# Patient Record
Sex: Male | Born: 1995
Health system: Southern US, Community
[De-identification: ages and names within clinical notes are randomized; demographics above are authoritative.]

## PROBLEM LIST (undated history)

## (undated) DIAGNOSIS — A539 Syphilis, unspecified: Secondary | ICD-10-CM

## (undated) DIAGNOSIS — K5792 Diverticulitis of intestine, part unspecified, without perforation or abscess without bleeding: Secondary | ICD-10-CM

## (undated) HISTORY — PX: OTHER SURGICAL HISTORY: SHX169

## (undated) HISTORY — DX: Syphilis, unspecified: A53.9

---
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
## 2014-02-06 ENCOUNTER — Encounter (HOSPITAL_COMMUNITY): Payer: Self-pay | Admitting: Cardiology

## 2014-02-06 ENCOUNTER — Emergency Department (HOSPITAL_COMMUNITY)
Admission: EM | Admit: 2014-02-06 | Discharge: 2014-02-06 | Disposition: A | Discharge status: Home / Self Care | Payer: Medicaid Other | Attending: Emergency Medicine | Admitting: Emergency Medicine

## 2014-02-06 DIAGNOSIS — Z79899 Other long term (current) drug therapy: Secondary | ICD-10-CM | POA: Diagnosis not present

## 2014-02-06 DIAGNOSIS — Y9289 Other specified places as the place of occurrence of the external cause: Secondary | ICD-10-CM | POA: Diagnosis not present

## 2014-02-06 DIAGNOSIS — Y998 Other external cause status: Secondary | ICD-10-CM | POA: Diagnosis not present

## 2014-02-06 DIAGNOSIS — Y9389 Activity, other specified: Secondary | ICD-10-CM | POA: Insufficient documentation

## 2014-02-06 DIAGNOSIS — Z791 Long term (current) use of non-steroidal anti-inflammatories (NSAID): Secondary | ICD-10-CM | POA: Diagnosis not present

## 2014-02-06 DIAGNOSIS — S01511A Laceration without foreign body of lip, initial encounter: Secondary | ICD-10-CM | POA: Insufficient documentation

## 2014-02-06 MED ORDER — LIDOCAINE-EPINEPHRINE (PF) 2 %-1:200000 IJ SOLN
10.0000 mL | Freq: Once | INTRAMUSCULAR | Status: AC
  Administered 2014-02-06: 10 mL
  Filled 2014-02-06: qty 20

## 2014-02-06 MED ORDER — AMOXICILLIN 500 MG PO CAPS: 500.0000 mg | ORAL_CAPSULE | Freq: Three times a day (TID) | ORAL | Status: AC
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
## 2014-02-06 NOTE — Discharge Instructions (Signed)
Facial Laceration °A facial laceration is a cut on the face. These injuries can be painful and cause bleeding. Some cuts may need to be closed with stitches (sutures), skin adhesive strips, or wound glue. Cuts usually heal quickly but can leave a scar. It can take 1-2 years for the scar to go away completely. °HOME CARE  °· Only take medicines as told by your doctor. °· Follow your doctor's instructions for wound care. °For Stitches: °· Keep the cut clean and dry. °· If you have a bandage (dressing), change it at least once a day. Change the bandage if it gets wet or dirty, or as told by your doctor. °· Wash the cut with soap and water 2 times a day. Rinse the cut with water. Pat it dry with a clean towel. °· Put a thin layer of medicated cream on the cut as told by your doctor. °· You may shower after the first 24 hours. Do not soak the cut in water until the stitches are removed. °· Have your stitches removed as told by your doctor. °· Do not wear any makeup until a few days after your stitches are removed. °For Skin Adhesive Strips: °· Keep the cut clean and dry. °· Do not get the strips wet. You may take a bath, but be careful to keep the cut dry. °· If the cut gets wet, pat it dry with a clean towel. °· The strips will fall off on their own. Do not remove the strips that are still stuck to the cut. °For Wound Glue: °· You may shower or take baths. Do not soak or scrub the cut. Do not swim. Avoid heavy sweating until the glue falls off on its own. After a shower or bath, pat the cut dry with a clean towel. °· Do not put medicine or makeup on your cut until the glue falls off. °· If you have a bandage, do not put tape over the glue. °· Avoid lots of sunlight or tanning lamps until the glue falls off. °· The glue will fall off on its own in 5-10 days. Do not pick at the glue. °After Healing: °Put sunscreen on the cut for the first year to reduce your scar. °GET HELP RIGHT AWAY IF:  °· Your cut area gets red,  painful, or puffy (swollen). °· You see a yellowish-white fluid (pus) coming from the cut. °· You have chills or a fever. °MAKE SURE YOU:  °· Understand these instructions. °· Will watch your condition. °· Will get help right away if you are not doing well or get worse. °Document Released: 06/16/2007 Document Revised: 10/18/2012 Document Reviewed: 08/10/2012 °ExitCare® Patient Information ©2015 [employer]. This information is not intended to replace advice given to you by your health care provider. Make sure you discuss any questions you have with your health care provider. ° °
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
## 2014-04-05 ENCOUNTER — Encounter (HOSPITAL_COMMUNITY): Payer: Self-pay | Admitting: Emergency Medicine

## 2014-04-05 ENCOUNTER — Emergency Department (HOSPITAL_COMMUNITY)
Admission: EM | Admit: 2014-04-05 | Discharge: 2014-04-05 | Disposition: A | Discharge status: Home / Self Care | Payer: Medicaid Other | Attending: Emergency Medicine | Admitting: Emergency Medicine

## 2014-04-05 DIAGNOSIS — W1839XA Other fall on same level, initial encounter: Secondary | ICD-10-CM | POA: Diagnosis not present

## 2014-04-05 DIAGNOSIS — Y998 Other external cause status: Secondary | ICD-10-CM | POA: Diagnosis not present

## 2014-04-05 DIAGNOSIS — Y9389 Activity, other specified: Secondary | ICD-10-CM | POA: Insufficient documentation

## 2014-04-05 DIAGNOSIS — S39012A Strain of muscle, fascia and tendon of lower back, initial encounter: Secondary | ICD-10-CM | POA: Insufficient documentation

## 2014-04-05 DIAGNOSIS — Y9289 Other specified places as the place of occurrence of the external cause: Secondary | ICD-10-CM | POA: Insufficient documentation

## 2014-04-05 DIAGNOSIS — S3992XA Unspecified injury of lower back, initial encounter: Secondary | ICD-10-CM | POA: Diagnosis present

## 2014-04-05 MED ORDER — HYDROCODONE-ACETAMINOPHEN 5-325 MG PO TABS: 1.0000 | ORAL_TABLET | ORAL | Status: DC | PRN

## 2014-04-05 MED ORDER — DIAZEPAM 5 MG PO TABS
5.0000 mg | ORAL_TABLET | Freq: Once | ORAL | Status: AC
  Administered 2014-04-05: 5 mg via ORAL
  Filled 2014-04-05: qty 1

## 2014-04-05 MED ORDER — ONDANSETRON HCL 4 MG PO TABS
4.0000 mg | ORAL_TABLET | Freq: Once | ORAL | Status: AC
  Administered 2014-04-05: 4 mg via ORAL
  Filled 2014-04-05: qty 1

## 2014-04-05 MED ORDER — IBUPROFEN 800 MG PO TABS
800.0000 mg | ORAL_TABLET | Freq: Once | ORAL | Status: AC
  Administered 2014-04-05: 800 mg via ORAL
  Filled 2014-04-05: qty 1

## 2014-04-05 MED ORDER — MELOXICAM 15 MG PO TABS: 15.0000 mg | ORAL_TABLET | Freq: Every day | ORAL | Status: DC

## 2014-04-05 MED ORDER — METHOCARBAMOL 500 MG PO TABS: 500.0000 mg | ORAL_TABLET | Freq: Three times a day (TID) | ORAL | Status: DC

## 2014-04-05 MED ORDER — HYDROCODONE-ACETAMINOPHEN 5-325 MG PO TABS
1.0000 | ORAL_TABLET | Freq: Once | ORAL | Status: AC
  Administered 2014-04-05: 1 via ORAL
  Filled 2014-04-05: qty 1

## 2014-04-05 NOTE — ED Provider Notes (Signed)
CSN: [id number]     Arrival date & time 04/05/14  1478 History   First MD Initiated Contact with Patient 04/05/14 0800     Chief Complaint  Patient presents with  . Back Pain     (Consider location/radiation/quality/duration/timing/severity/associated sxs/prior Treatment) Patient is a 19 y.o. male presenting with back pain. The history is provided by the patient.  Back Pain Location:  Lumbar spine Quality:  Aching and shooting Radiates to:  Does not radiate Pain severity:  Severe Pain is:  Same all the time Onset quality:  Sudden Duration:  1 day Timing:  Intermittent Progression:  Worsening Chronicity:  New Context: falling   Context comment:  Pt had a near fall on yesterday. Increasing pain and spasm last night. Relieved by:  Nothing Worsened by:  Movement and bending Ineffective treatments:  None tried Associated symptoms: no abdominal pain, no bladder incontinence, no bowel incontinence, no chest pain, no dysuria and no perianal numbness   Risk factors: not obese and no recent surgery     History reviewed. No pertinent past medical history. History reviewed. No pertinent past surgical history. No family history on file. History  Substance Use Topics  . Smoking status: Never Smoker   . Smokeless tobacco: Never Used  . Alcohol Use: No    Review of Systems  Constitutional: Negative for activity change.       All ROS Neg except as noted in HPI  HENT: Negative for nosebleeds.   Eyes: Negative for photophobia and discharge.  Respiratory: Negative for cough, shortness of breath and wheezing.   Cardiovascular: Negative for chest pain and palpitations.  Gastrointestinal: Negative for abdominal pain, blood in stool and bowel incontinence.  Genitourinary: Negative for bladder incontinence, dysuria, frequency and hematuria.  Musculoskeletal: Positive for back pain. Negative for arthralgias and neck pain.  Skin: Negative.   Neurological: Negative for dizziness, seizures and  speech difficulty.  Psychiatric/Behavioral: Negative for hallucinations and confusion.      Allergies  Review of patient's allergies indicates no known allergies.  Home Medications   Prior to Admission medications   Medication Sig Start Date End Date Taking? Authorizing Provider  cyclobenzaprine (FLEXERIL) 5 MG tablet Take 1 tablet (5 mg total) by mouth 3 (three) times daily as needed for muscle spasms. Patient not taking: Reported on 02/06/2014 09/20/13   Burgess Amor, PA-C  ibuprofen (ADVIL,MOTRIN) 600 MG tablet Take 1 tablet (600 mg total) by mouth 3 (three) times daily. Patient not taking: Reported on 02/06/2014 09/20/13   Burgess Amor, PA-C   BP 123/77 mmHg  Pulse 54  Temp(Src) 97.6 F (36.4 C) (Oral)  Resp 18  Ht  (1.778 m)  Wt 158 lb (71.668 kg)  BMI 22.67 kg/m2  SpO2 100% Physical Exam  Constitutional: He is oriented to person, place, and time. He appears well-developed and well-nourished.  Non-toxic appearance.  HENT:  Head: Normocephalic.  Right Ear: Tympanic membrane and external ear normal.  Left Ear: Tympanic membrane and external ear normal.  Eyes: EOM and lids are normal. Pupils are equal, round, and reactive to light.  Neck: Normal range of motion. Neck supple. Carotid bruit is not present.  Cardiovascular: Normal rate, regular rhythm, normal heart sounds, intact distal pulses and normal pulses.   Pulmonary/Chest: Breath sounds normal. No respiratory distress.  Abdominal: Soft. Bowel sounds are normal. There is no tenderness. There is no guarding.  Musculoskeletal:       Lumbar back: He exhibits decreased range of motion, tenderness, pain and spasm.  Lymphadenopathy:       Head (right side): No submandibular adenopathy present.       Head (left side): No submandibular adenopathy present.    He has no cervical adenopathy.  Neurological: He is alert and oriented to person, place, and time. He has normal strength. No cranial nerve deficit or sensory deficit.   Skin: Skin is warm and dry.  Psychiatric: He has a normal mood and affect. His speech is normal.  Nursing note and vitals reviewed.   ED Course  Procedures (including critical care time) Labs Review Labs Reviewed - No data to display  Imaging Review No results found.   EKG Interpretation None      MDM Vital signs are well within normal limits. No gross neurologic deficits appreciated on examination. No evidence for caudal equina. Gait is intact. Suspect muscle strain/spasm.  Plan at this time so the patient be treated with Mobic, Robaxin, and Norco. Patient is to follow-up with his primary physician for additional workup and management.    Final diagnoses:  None    **I have reviewed nursing notes, vital signs, and all appropriate lab and imaging results for this patient.Ivery Quale*    Brittanyann Wittner, PA-C 04/05/14 [phone]  Mancel BaleElliott Wentz, MD 04/05/14 [phone]

## 2014-04-05 NOTE — ED Notes (Signed)
Patient c/o severe mid/lower back pain that started last night with no known injury. Denies any urinary symptoms. Per patient has not taken any medication for pain.

## 2014-04-05 NOTE — Discharge Instructions (Signed)
Please apply ice to the lower back today and tomorrow. Sunday apply heat, or soak in a tub or warm water. Use mobic and robaxin daily. Use norco for more severe pain if needed. Norco and robaxin may cause drowsiness, use with caution. This medication may cause drowsiness. Please do not drink, drive, or participate in activity that requires concentration while taking this medication. Muscle Strain A muscle strain (pulled muscle) happens when a muscle is stretched beyond normal length. It happens when a sudden, violent force stretches your muscle too far. Usually, a few of the fibers in your muscle are torn. Muscle strain is common in athletes. Recovery usually takes 1-2 weeks. Complete healing takes 5-6 weeks.  HOME CARE   Follow the PRICE method of treatment to help your injury get better. Do this the first 2-3 days after the injury:  Protect. Protect the muscle to keep it from getting injured again.  Rest. Limit your activity and rest the injured body part.  Ice. Put ice in a plastic bag. Place a towel between your skin and the bag. Then, apply the ice and leave it on from 15-20 minutes each hour. After the third day, switch to moist heat packs.  Compression. Use a splint or elastic bandage on the injured area for comfort. Do not put it on too tightly.  Elevate. Keep the injured body part above the level of your heart.  Only take medicine as told by your doctor.  Warm up before doing exercise to prevent future muscle strains. GET HELP IF:   You have more pain or puffiness (swelling) in the injured area.  You feel numbness, tingling, or notice a loss of strength in the injured area. MAKE SURE YOU:   Understand these instructions.  Will watch your condition.  Will get help right away if you are not doing well or get worse. Document Released: 10/07/2007 Document Revised: 10/18/2012 Document Reviewed: 07/27/2012 [facility] Patient Information 2015 [place]Care, [place]. This information is not  intended to replace advice given to you by your health care provider. Make sure you discuss any questions you have with your health care provider.

## 2014-11-04 ENCOUNTER — Encounter (HOSPITAL_COMMUNITY): Payer: Self-pay | Admitting: Emergency Medicine

## 2014-11-04 ENCOUNTER — Emergency Department (HOSPITAL_COMMUNITY)
Admission: EM | Admit: 2014-11-04 | Discharge: 2014-11-04 | Disposition: A | Discharge status: Home / Self Care | Payer: Medicaid Other | Attending: Emergency Medicine | Admitting: Emergency Medicine

## 2014-11-04 DIAGNOSIS — Z791 Long term (current) use of non-steroidal anti-inflammatories (NSAID): Secondary | ICD-10-CM | POA: Diagnosis not present

## 2014-11-04 DIAGNOSIS — Z79899 Other long term (current) drug therapy: Secondary | ICD-10-CM | POA: Insufficient documentation

## 2014-11-04 DIAGNOSIS — M545 Low back pain: Secondary | ICD-10-CM | POA: Insufficient documentation

## 2014-11-04 DIAGNOSIS — R369 Urethral discharge, unspecified: Secondary | ICD-10-CM | POA: Diagnosis not present

## 2014-11-04 DIAGNOSIS — R300 Dysuria: Secondary | ICD-10-CM | POA: Insufficient documentation

## 2014-11-04 LAB — URINALYSIS, ROUTINE W REFLEX MICROSCOPIC
BILIRUBIN URINE: NEGATIVE
Glucose, UA: NEGATIVE mg/dL
HGB URINE DIPSTICK: NEGATIVE
KETONES UR: NEGATIVE mg/dL
NITRITE: NEGATIVE
PH: 5.5 (ref 5.0–8.0)
Protein, ur: NEGATIVE mg/dL
UROBILINOGEN UA: 0.2 mg/dL (ref 0.0–1.0)

## 2014-11-04 LAB — URINE MICROSCOPIC-ADD ON

## 2014-11-04 MED ORDER — CEPHALEXIN 500 MG PO CAPS: 500.0000 mg | ORAL_CAPSULE | Freq: Four times a day (QID) | ORAL | Status: DC

## 2014-11-04 MED ORDER — LIDOCAINE HCL (PF) 1 % IJ SOLN
INTRAMUSCULAR | Status: AC
  Administered 2014-11-04: 0.9 mL
  Filled 2014-11-04: qty 5

## 2014-11-04 MED ORDER — AZITHROMYCIN 250 MG PO TABS
1000.0000 mg | ORAL_TABLET | Freq: Once | ORAL | Status: AC
  Administered 2014-11-04: 1000 mg via ORAL
  Filled 2014-11-04: qty 4

## 2014-11-04 MED ORDER — CEFTRIAXONE SODIUM 250 MG IJ SOLR
250.0000 mg | Freq: Once | INTRAMUSCULAR | Status: AC
  Administered 2014-11-04: 250 mg via INTRAMUSCULAR
  Filled 2014-11-04: qty 250

## 2014-11-04 NOTE — Discharge Instructions (Signed)

## 2014-11-04 NOTE — ED Provider Notes (Signed)
CSN: [id number]     Arrival date & time 11/04/14  1040 History  By signing my name below, I, Thomas Valdez, attest that this documentation has been prepared under the direction and in the presence of Pauline Aus, PA-C  Electronically Signed: Gwenyth Valdez, ED Scribe. 11/04/2014. 11:46 AM.   Chief Complaint  Patient presents with  . Urinary Tract Infection   Patient is a 19 y.o. male presenting with dysuria. The history is provided by the patient. No language interpreter was used.  Dysuria This is a new problem. The current episode started more than 1 week ago. The problem occurs constantly. The problem has not changed since onset.Pertinent negatives include no abdominal pain. Nothing aggravates the symptoms. Nothing relieves the symptoms. He has tried nothing for the symptoms.    HPI Comments: Thomas Valdez is a 19 y.o. male who presents to the Emergency Department complaining of  mild dysuria that started 2 weeks, resolved and returned yesterday. He states penile discharge and lower back pain as associated symptoms. Pt endorses unprotected sex with one partner. He denies a history of UTI. Pt also denies abdominal pain, dark urine, malodorous urine, fever and testicular pain as associated symptoms.  History reviewed. No pertinent past medical history. History reviewed. No pertinent past surgical history. History reviewed. No pertinent family history. Social History  Substance Use Topics  . Smoking status: Never Smoker   . Smokeless tobacco: Never Used  . Alcohol Use: No    Review of Systems  Constitutional: Negative for fever.  Gastrointestinal: Negative for nausea, vomiting and abdominal pain.  Genitourinary: Positive for dysuria and discharge. Negative for hematuria, scrotal swelling, genital sores and testicular pain.  Musculoskeletal: Positive for back pain.  All other systems reviewed and are negative.  Allergies  Review of patient's allergies indicates no known  allergies.  Home Medications   Prior to Admission medications   Medication Sig Start Date End Date Taking? Authorizing Provider  cyclobenzaprine (FLEXERIL) 5 MG tablet Take 1 tablet (5 mg total) by mouth 3 (three) times daily as needed for muscle spasms. Patient not taking: Reported on 02/06/2014 09/20/13   Burgess Amor, PA-C  HYDROcodone-acetaminophen (NORCO/VICODIN) 5-325 MG per tablet Take 1 tablet by mouth every 4 (four) hours as needed. 04/05/14   Ivery Quale, PA-C  ibuprofen (ADVIL,MOTRIN) 600 MG tablet Take 1 tablet (600 mg total) by mouth 3 (three) times daily. Patient not taking: Reported on 02/06/2014 09/20/13   Burgess Amor, PA-C  meloxicam (MOBIC) 15 MG tablet Take 1 tablet (15 mg total) by mouth daily. 04/05/14   Ivery Quale, PA-C  methocarbamol (ROBAXIN) 500 MG tablet Take 1 tablet (500 mg total) by mouth 3 (three) times daily. 04/05/14   Ivery Quale, PA-C   BP 127/75 mmHg  Pulse 64  Temp(Src) 97.7 F (36.5 C) (Oral)  Resp 18  Ht  (1.778 m)  Wt 165 lb (74.844 kg)  BMI 23.68 kg/m2  SpO2 100% Physical Exam  Constitutional: He appears well-developed and well-nourished. No distress.  HENT:  Head: Normocephalic and atraumatic.  Eyes: Conjunctivae and EOM are normal.  Neck: Neck supple. No tracheal deviation present.  Cardiovascular: Normal rate, regular rhythm and normal heart sounds.   Pulmonary/Chest: Effort normal and breath sounds normal. No respiratory distress.  Abdominal: Soft. There is no tenderness.  Genitourinary:  Chaperoned by nurse No penile discharge on exam Cremasteric reflex intact No genital lesions No obvious inguinal hernias  Skin: Skin is warm and dry.  Psychiatric: He has a  normal mood and affect. His behavior is normal.  Nursing note and vitals reviewed.   ED Course  Procedures   DIAGNOSTIC STUDIES: Oxygen Saturation is 100% on RA, normal by my interpretation.    COORDINATION OF CARE: 11:46 AM Discussed treatment plan with pt which  includes UA, HIV antibody and RPR. Pt agreed to plan.  Labs Review Labs Reviewed  URINALYSIS, ROUTINE W REFLEX MICROSCOPIC ([facility]) - Abnormal; Notable for the following:    Specific Gravity, Urine >1.030 (*)    Leukocytes, UA TRACE (*)    All other components within normal limits  RPR - Abnormal; Notable for the following:    RPR Ser Ql Reactive (*)    All other components within normal limits  URINE MICROSCOPIC-ADD ON - Abnormal; Notable for the following:    Bacteria, UA MANY (*)    All other components within normal limits  RPR, QUANT+TP ABS (REFLEX) - Abnormal; Notable for the following:    Rapid Plasma Reagin, Quant 1:8 (*)    T Pallidum Abs Positive (*)    All other components within normal limits  GC/CHLAMYDIA PROBE AMP ([place]) NOT AT [facility] - Abnormal; Notable for the following:    Neisseria gonorrhea **POSITIVE** (*)    All other components within normal limits  URINE CULTURE  HIV ANTIBODY (ROUTINE TESTING)      MDM     Final diagnoses:  Dysuria    Pt is well appearing.  Vitals stable. Pt on cell phone during my exam.  Dysuria sx's in a sexually active male.  I will tx with rocephin and zithromax.  Cultures pending.  Will also tx with keflex.  Pt agrees to plan and health dept f/u if needed.  Stable for d/c  I personally performed the services described in this documentation, which was scribed in my presence. The recorded information has been reviewed and is accurate.    Pauline Ausammy Yvonnie Schinke, PA-C 11/06/14 1634  Blane OharaJoshua Zavitz, MD 11/08/14 [phone]

## 2014-11-04 NOTE — ED Notes (Signed)
Having burning when voiding for last week on and off.  Burning has increased yesterday.

## 2014-11-05 LAB — RPR, QUANT+TP ABS (REFLEX): TREPONEMA PALLIDUM AB: POSITIVE — AB

## 2014-11-05 LAB — GC/CHLAMYDIA PROBE AMP (~~LOC~~) NOT AT ARMC
Chlamydia: NEGATIVE
Neisseria Gonorrhea: POSITIVE — AB

## 2014-11-05 LAB — RPR: RPR Ser Ql: REACTIVE — AB

## 2014-11-05 LAB — HIV ANTIBODY (ROUTINE TESTING W REFLEX): HIV SCREEN 4TH GENERATION: NONREACTIVE

## 2014-11-06 ENCOUNTER — Telehealth (HOSPITAL_BASED_OUTPATIENT_CLINIC_OR_DEPARTMENT_OTHER): Payer: Self-pay | Admitting: Emergency Medicine

## 2014-11-06 LAB — URINE CULTURE: CULTURE: NO GROWTH

## 2014-11-06 NOTE — Telephone Encounter (Signed)
Chart handoff to EDP for treatment plan for + Gonorrhea and + RPR

## 2014-11-09 ENCOUNTER — Telehealth (HOSPITAL_BASED_OUTPATIENT_CLINIC_OR_DEPARTMENT_OTHER): Payer: Self-pay | Admitting: Emergency Medicine

## 2014-11-10 ENCOUNTER — Telehealth (HOSPITAL_BASED_OUTPATIENT_CLINIC_OR_DEPARTMENT_OTHER): Payer: Self-pay | Admitting: Emergency Medicine

## 2014-11-12 ENCOUNTER — Telehealth (HOSPITAL_BASED_OUTPATIENT_CLINIC_OR_DEPARTMENT_OTHER): Payer: Self-pay | Admitting: Emergency Medicine

## 2014-11-14 ENCOUNTER — Telehealth (HOSPITAL_BASED_OUTPATIENT_CLINIC_OR_DEPARTMENT_OTHER): Payer: Self-pay | Admitting: Emergency Medicine

## 2014-12-04 ENCOUNTER — Telehealth (HOSPITAL_COMMUNITY): Payer: Self-pay

## 2014-12-04 NOTE — Telephone Encounter (Signed)
Unable to reach by phone or mail.  Chart closed.   

## 2015-03-02 ENCOUNTER — Emergency Department (HOSPITAL_COMMUNITY)
Admission: EM | Admit: 2015-03-02 | Discharge: 2015-03-03 | Disposition: A | Discharge status: Home / Self Care | Payer: Medicaid Other | Attending: Emergency Medicine | Admitting: Emergency Medicine

## 2015-03-02 ENCOUNTER — Encounter (HOSPITAL_COMMUNITY): Payer: Self-pay | Admitting: Emergency Medicine

## 2015-03-02 ENCOUNTER — Emergency Department (HOSPITAL_COMMUNITY)
Admission: EM | Admit: 2015-03-02 | Discharge: 2015-03-02 | Disposition: A | Discharge status: Home / Self Care | Payer: Medicaid Other | Source: Home / Self Care

## 2015-03-02 ENCOUNTER — Encounter (HOSPITAL_COMMUNITY): Payer: Self-pay | Admitting: *Deleted

## 2015-03-02 DIAGNOSIS — R112 Nausea with vomiting, unspecified: Secondary | ICD-10-CM

## 2015-03-02 DIAGNOSIS — R197 Diarrhea, unspecified: Secondary | ICD-10-CM

## 2015-03-02 DIAGNOSIS — R509 Fever, unspecified: Secondary | ICD-10-CM

## 2015-03-02 DIAGNOSIS — R109 Unspecified abdominal pain: Secondary | ICD-10-CM | POA: Diagnosis present

## 2015-03-02 LAB — CBC WITH DIFFERENTIAL/PLATELET
Basophils Absolute: 0 10*3/uL (ref 0.0–0.1)
Basophils Relative: 0 %
EOS PCT: 2 %
Eosinophils Absolute: 0.1 10*3/uL (ref 0.0–0.7)
HCT: 42.4 % (ref 39.0–52.0)
HEMOGLOBIN: 14.4 g/dL (ref 13.0–17.0)
LYMPHS ABS: 1.7 10*3/uL (ref 0.7–4.0)
LYMPHS PCT: 48 %
MCH: 30.7 pg (ref 26.0–34.0)
MCHC: 34 g/dL (ref 30.0–36.0)
MCV: 90.4 fL (ref 78.0–100.0)
Monocytes Absolute: 0.4 10*3/uL (ref 0.1–1.0)
Monocytes Relative: 10 %
Neutro Abs: 1.4 10*3/uL — ABNORMAL LOW (ref 1.7–7.7)
Neutrophils Relative %: 40 %
PLATELETS: 299 10*3/uL (ref 150–400)
RBC: 4.69 MIL/uL (ref 4.22–5.81)
RDW: 12.2 % (ref 11.5–15.5)
WBC: 3.6 10*3/uL — AB (ref 4.0–10.5)

## 2015-03-02 LAB — COMPREHENSIVE METABOLIC PANEL
ALK PHOS: 57 U/L (ref 38–126)
ALT: 20 U/L (ref 17–63)
AST: 21 U/L (ref 15–41)
Albumin: 4.7 g/dL (ref 3.5–5.0)
Anion gap: 6 (ref 5–15)
BUN: 15 mg/dL (ref 6–20)
CALCIUM: 9.3 mg/dL (ref 8.9–10.3)
CO2: 27 mmol/L (ref 22–32)
CREATININE: 0.97 mg/dL (ref 0.61–1.24)
Chloride: 105 mmol/L (ref 101–111)
Glucose, Bld: 103 mg/dL — ABNORMAL HIGH (ref 65–99)
Potassium: 4.3 mmol/L (ref 3.5–5.1)
Sodium: 138 mmol/L (ref 135–145)
Total Bilirubin: 0.6 mg/dL (ref 0.3–1.2)
Total Protein: 7.8 g/dL (ref 6.5–8.1)

## 2015-03-02 MED ORDER — ONDANSETRON 8 MG PO TBDP
8.0000 mg | ORAL_TABLET | Freq: Once | ORAL | Status: AC
  Administered 2015-03-02: 8 mg via ORAL
  Filled 2015-03-02: qty 1

## 2015-03-02 MED ORDER — ONDANSETRON HCL 4 MG PO TABS: 4.0000 mg | ORAL_TABLET | Freq: Four times a day (QID) | ORAL | Status: DC

## 2015-03-02 NOTE — ED Notes (Signed)
Called x2, no answer 

## 2015-03-02 NOTE — Discharge Instructions (Signed)
Food Choices to Help Relieve Diarrhea, Adult °When you have diarrhea, the foods you eat and your eating habits are very important. Choosing the right foods and drinks can help relieve diarrhea. Also, because diarrhea can last up to 7 days, you need to replace lost fluids and electrolytes (such as sodium, potassium, and chloride) in order to help prevent dehydration.  °WHAT GENERAL GUIDELINES DO I NEED TO FOLLOW? °· Slowly drink 1 cup (8 oz) of fluid for each episode of diarrhea. If you are getting enough fluid, your urine will be clear or pale yellow. °· Eat starchy foods. Some good choices include white rice, white toast, pasta, low-fiber cereal, baked potatoes (without the skin), saltine crackers, and bagels. °· Avoid large servings of any cooked vegetables. °· Limit fruit to two servings per day. A serving is ½ cup or 1 small piece. °· Choose foods with less than 2 g of fiber per serving. °· Limit fats to less than 8 tsp (38 g) per day. °· Avoid fried foods. °· Eat foods that have probiotics in them. Probiotics can be found in certain dairy products. °· Avoid foods and beverages that may increase the speed at which food moves through the stomach and intestines (gastrointestinal tract). Things to avoid include: °¨ High-fiber foods, such as dried fruit, raw fruits and vegetables, nuts, seeds, and whole grain foods. °¨ Spicy foods and high-fat foods. °¨ Foods and beverages sweetened with high-fructose corn syrup, honey, or sugar alcohols such as xylitol, sorbitol, and mannitol. °WHAT FOODS ARE RECOMMENDED? °Grains °White rice. White, French, or pita breads (fresh or toasted), including plain rolls, buns, or bagels. White pasta. Saltine, soda, or graham crackers. Pretzels. Low-fiber cereal. Cooked cereals made with water (such as cornmeal, farina, or cream cereals). Plain muffins. Matzo. Melba toast. Zwieback.  °Vegetables °Potatoes (without the skin). Strained tomato and vegetable juices. Most well-cooked and canned  vegetables without seeds. Tender lettuce. °Fruits °Cooked or canned applesauce, apricots, cherries, fruit cocktail, grapefruit, peaches, pears, or plums. Fresh bananas, apples without skin, cherries, grapes, cantaloupe, grapefruit, peaches, oranges, or plums.  °Meat and Other Protein Products °Baked or boiled chicken. Eggs. Tofu. Fish. Seafood. Smooth peanut butter. Ground or well-cooked tender beef, ham, veal, lamb, pork, or poultry.  °Dairy °Plain yogurt, kefir, and unsweetened liquid yogurt. Lactose-free milk, buttermilk, or soy milk. Plain hard cheese. °Beverages °Sport drinks. Clear broths. Diluted fruit juices (except prune). Regular, caffeine-free sodas such as ginger ale. Water. Decaffeinated teas. Oral rehydration solutions. Sugar-free beverages not sweetened with sugar alcohols. °Other °Bouillon, broth, or soups made from recommended foods.  °The items listed above may not be a complete list of recommended foods or beverages. Contact your dietitian for more options. °WHAT FOODS ARE NOT RECOMMENDED? °Grains °Whole grain, whole wheat, bran, or rye breads, rolls, pastas, crackers, and cereals. Wild or brown rice. Cereals that contain more than 2 g of fiber per serving. Corn tortillas or taco shells. Cooked or dry oatmeal. Granola. Popcorn. °Vegetables °Raw vegetables. Cabbage, broccoli, Brussels sprouts, artichokes, baked beans, beet greens, corn, kale, legumes, peas, sweet potatoes, and yams. Potato skins. Cooked spinach and cabbage. °Fruits °Dried fruit, including raisins and dates. Raw fruits. Stewed or dried prunes. Fresh apples with skin, apricots, mangoes, pears, raspberries, and strawberries.  °Meat and Other Protein Products °Chunky peanut butter. Nuts and seeds. Beans and lentils. Bacon.  °Dairy °High-fat cheeses. Milk, chocolate milk, and beverages made with milk, such as milk shakes. Cream. Ice cream. °Sweets and Desserts °Sweet rolls, doughnuts, and sweet breads.   Pancakes and waffles. °Fats and  Oils °Butter. Cream sauces. Margarine. Salad oils. Plain salad dressings. Olives. Avocados.  °Beverages °Caffeinated beverages (such as coffee, tea, soda, or energy drinks). Alcoholic beverages. Fruit juices with pulp. Prune juice. Soft drinks sweetened with high-fructose corn syrup or sugar alcohols. °Other °Coconut. Hot sauce. Chili powder. Mayonnaise. Gravy. Cream-based or milk-based soups.  °The items listed above may not be a complete list of foods and beverages to avoid. Contact your dietitian for more information. °WHAT SHOULD I DO IF I BECOME DEHYDRATED? °Diarrhea can sometimes lead to dehydration. Signs of dehydration include dark urine and dry mouth and skin. If you think you are dehydrated, you should rehydrate with an oral rehydration solution. These solutions can be purchased at pharmacies, retail stores, or online.  °Drink ½-1 cup (120-240 mL) of oral rehydration solution each time you have an episode of diarrhea. If drinking this amount makes your diarrhea worse, try drinking smaller amounts more often. For example, drink 1-3 tsp (5-15 mL) every 5-10 minutes.  °A general rule for staying hydrated is to drink 1½-2 L of fluid per day. Talk to your health care provider about the specific amount you should be drinking each day. Drink enough fluids to keep your urine clear or pale yellow. °  °This information is not intended to replace advice given to you by your health care provider. Make sure you discuss any questions you have with your health care provider. °  °Document Released: 03/20/2003 Document Revised: 01/18/2014 Document Reviewed: 11/20/2012 °Elsevier Interactive Patient Education ©2016 [employer] ° °Nausea and Vomiting °Nausea means you feel sick to your stomach. Throwing up (vomiting) is a reflex where stomach contents come out of your mouth. °HOME CARE  °· Take medicine as told by your doctor. °· Do not force yourself to eat. However, you do need to drink fluids. °· If you feel like eating,  eat a normal diet as told by your doctor. °¨ Eat rice, wheat, potatoes, bread, lean meats, yogurt, fruits, and vegetables. °¨ Avoid high-fat foods. °· Drink enough fluids to keep your pee (urine) clear or pale yellow. °· Ask your doctor how to replace body fluid losses (rehydrate). Signs of body fluid loss (dehydration) include: °¨ Feeling very thirsty. °¨ Dry lips and mouth. °¨ Feeling dizzy. °¨ Dark pee. °¨ Peeing less than normal. °¨ Feeling confused. °¨ Fast breathing or heart rate. °GET HELP RIGHT AWAY IF:  °· You have blood in your throw up. °· You have black or bloody poop (stool). °· You have a bad headache or stiff neck. °· You feel confused. °· You have bad belly (abdominal) pain. °· You have chest pain or trouble breathing. °· You do not pee at least once every 8 hours. °· You have cold, clammy skin. °· You keep throwing up after 24 to 48 hours. °· You have a fever. °MAKE SURE YOU:  °· Understand these instructions. °· Will watch your condition. °· Will get help right away if you are not doing well or get worse. °  °This information is not intended to replace advice given to you by your health care provider. Make sure you discuss any questions you have with your health care provider. °  °Document Released: 06/16/2007 Document Revised: 03/22/2011 Document Reviewed: 05/29/2010 °Elsevier Interactive Patient Education ©2016 [employer] ° °

## 2015-03-02 NOTE — ED Notes (Signed)
Pt c/o n/v/d that started earlier this am;

## 2015-03-02 NOTE — ED Provider Notes (Signed)
CSN: [id number]     Arrival date & time 03/02/15  2158 History   First MD Initiated Contact with Patient 03/02/15 2224     Chief Complaint  Patient presents with  . Abdominal Pain     (Consider location/radiation/quality/duration/timing/severity/associated sxs/prior Treatment) HPI  Thomas Valdez is a 20 y.o. male who presents to the Emergency Department complaining of sudden onset of vomiting, diarrhea and cramping abdominal pain that began early this morning.  He states that he had 4 episodes of vomiting and several episodes of watery stools before symptoms resolved at 9:00 am this morning.  He has not tried to eat or drink anything since this morning. He states his symptoms have improved, but came to  "be checked"  He denies fever, abdominal pain, dysuria, chills, bloody stools or vomitus.      History reviewed. No pertinent past medical history. History reviewed. No pertinent past surgical history. History reviewed. No pertinent family history. Social History  Substance Use Topics  . Smoking status: Never Smoker   . Smokeless tobacco: Never Used  . Alcohol Use: No    Review of Systems  Constitutional: Negative for fever, chills, activity change and appetite change.  HENT: Negative for congestion and sore throat.   Respiratory: Negative for shortness of breath.   Cardiovascular: Negative for chest pain.  Gastrointestinal: Positive for nausea, vomiting and diarrhea. Negative for abdominal pain and blood in stool.  Endocrine: Negative for polydipsia and polyuria.  Genitourinary: Negative for dysuria, flank pain, decreased urine volume and difficulty urinating.  Musculoskeletal: Negative for back pain.  Skin: Negative for color change and rash.  Neurological: Negative for dizziness, weakness and numbness.  Hematological: Negative for adenopathy.  All other systems reviewed and are negative.     Allergies  Review of patient's allergies indicates no known  allergies.  Home Medications   Prior to Admission medications   Medication Sig Start Date End Date Taking? Authorizing Provider  bismuth subsalicylate (PEPTO BISMOL) 262 MG/15ML suspension Take 30 mLs by mouth every 6 (six) hours as needed for indigestion or diarrhea or loose stools.   Yes Historical Provider, MD  cephALEXin (KEFLEX) 500 MG capsule Take 1 capsule (500 mg total) by mouth 4 (four) times daily. For 7 days Patient not taking: Reported on 03/02/2015 11/04/14   Aniyah Nobis, PA-C  HYDROcodone-acetaminophen (NORCO/VICODIN) 5-325 MG per tablet Take 1 tablet by mouth every 4 (four) hours as needed. Patient not taking: Reported on 03/02/2015 04/05/14   Ivery Quale, PA-C   BP 128/82 mmHg  Pulse 86  Temp(Src) 98.9 F (37.2 C) (Oral)  Resp 18  Ht  (1.778 m)  Wt 74.844 kg  BMI 23.68 kg/m2  SpO2 100% Physical Exam  Constitutional: He is oriented to person, place, and time. He appears well-developed and well-nourished. No distress.  HENT:  Head: Normocephalic and atraumatic.  Mouth/Throat: Oropharynx is clear and moist.  Neck: Normal range of motion.  Cardiovascular: Normal rate, regular rhythm, normal heart sounds and intact distal pulses.   No murmur heard. Pulmonary/Chest: Effort normal and breath sounds normal. No respiratory distress.  Abdominal: Soft. Normal appearance and bowel sounds are normal. He exhibits no distension and no mass. There is no hepatosplenomegaly. There is no tenderness. There is no rigidity, no rebound, no guarding and no tenderness at McBurney's point.  Musculoskeletal: Normal range of motion. He exhibits no edema.  Lymphadenopathy:    He has no cervical adenopathy.  Neurological: He is alert and oriented to person, place,  and time. He exhibits normal muscle tone. Coordination normal.  Skin: Skin is warm and dry.  Psychiatric: He has a normal mood and affect.  Nursing note and vitals reviewed.   ED Course  Procedures (including critical  care time) Labs Review Labs Reviewed - No data to display  Imaging Review No results found. I have personally reviewed and evaluated these images and lab results as part of my medical decision-making.   EKG Interpretation None      MDM   Final diagnoses:  Nausea vomiting and diarrhea    2345  Pt is feeling better, has gotten dressed and states he feels ready for d/c.  He has tolerated oral fluids and abd remains soft and NT.  Sx's likely related to viral illness.  No concerning sx's for acute abdomen.    Pt also seen by Dr. Adriana Simas and care plan discussed.  BRAT diet and clear fluids encouraged.  Pt agrees to return if needed.      Pauline Aus, PA-C 03/03/15 0007  Donnetta Hutching, MD 03/05/15 [phone]

## 2015-03-02 NOTE — ED Notes (Addendum)
Patient c/o nausea, vomiting, diarrhea, and fever. Per patient started this morning and has not drank or ate since. Denies any blood in stool. Denies any pain.

## 2015-03-02 NOTE — ED Notes (Signed)
Patient called for room, no answer. 

## 2015-09-18 ENCOUNTER — Emergency Department (HOSPITAL_COMMUNITY)
Admission: EM | Admit: 2015-09-18 | Discharge: 2015-09-18 | Disposition: A | Discharge status: Home / Self Care | Payer: No Typology Code available for payment source | Attending: Emergency Medicine | Admitting: Emergency Medicine

## 2015-09-18 ENCOUNTER — Encounter (HOSPITAL_COMMUNITY): Payer: Self-pay | Admitting: Emergency Medicine

## 2015-09-18 ENCOUNTER — Emergency Department (HOSPITAL_COMMUNITY): Payer: No Typology Code available for payment source

## 2015-09-18 DIAGNOSIS — Y9389 Activity, other specified: Secondary | ICD-10-CM | POA: Diagnosis not present

## 2015-09-18 DIAGNOSIS — Y92410 Unspecified street and highway as the place of occurrence of the external cause: Secondary | ICD-10-CM | POA: Diagnosis not present

## 2015-09-18 DIAGNOSIS — Y999 Unspecified external cause status: Secondary | ICD-10-CM | POA: Insufficient documentation

## 2015-09-18 DIAGNOSIS — S39012A Strain of muscle, fascia and tendon of lower back, initial encounter: Secondary | ICD-10-CM | POA: Insufficient documentation

## 2015-09-18 DIAGNOSIS — S3992XA Unspecified injury of lower back, initial encounter: Secondary | ICD-10-CM | POA: Diagnosis present

## 2015-09-18 MED ORDER — IBUPROFEN 600 MG PO TABS: 600.0000 mg | ORAL_TABLET | Freq: Four times a day (QID) | ORAL | 0 refills | Status: DC | PRN

## 2015-09-18 MED ORDER — TRAMADOL HCL 50 MG PO TABS: 50.0000 mg | ORAL_TABLET | Freq: Four times a day (QID) | ORAL | 0 refills | Status: DC | PRN

## 2015-09-18 MED ORDER — IBUPROFEN 800 MG PO TABS
800.0000 mg | ORAL_TABLET | Freq: Once | ORAL | Status: AC
  Administered 2015-09-18: 800 mg via ORAL
  Filled 2015-09-18: qty 1
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
## 2016-12-28 ENCOUNTER — Encounter (HOSPITAL_COMMUNITY): Payer: Self-pay | Admitting: Emergency Medicine

## 2016-12-28 ENCOUNTER — Other Ambulatory Visit: Payer: Self-pay

## 2016-12-28 ENCOUNTER — Emergency Department (HOSPITAL_COMMUNITY)
Admission: EM | Admit: 2016-12-28 | Discharge: 2016-12-28 | Disposition: A | Discharge status: Home / Self Care | Payer: Self-pay | Attending: Emergency Medicine | Admitting: Emergency Medicine

## 2016-12-28 ENCOUNTER — Emergency Department (HOSPITAL_COMMUNITY): Payer: Self-pay

## 2016-12-28 DIAGNOSIS — Y929 Unspecified place or not applicable: Secondary | ICD-10-CM | POA: Insufficient documentation

## 2016-12-28 DIAGNOSIS — W208XXA Other cause of strike by thrown, projected or falling object, initial encounter: Secondary | ICD-10-CM | POA: Insufficient documentation

## 2016-12-28 DIAGNOSIS — Y999 Unspecified external cause status: Secondary | ICD-10-CM | POA: Insufficient documentation

## 2016-12-28 DIAGNOSIS — Y939 Activity, unspecified: Secondary | ICD-10-CM | POA: Insufficient documentation

## 2016-12-28 DIAGNOSIS — S40012A Contusion of left shoulder, initial encounter: Secondary | ICD-10-CM | POA: Insufficient documentation

## 2016-12-28 DIAGNOSIS — T148XXA Other injury of unspecified body region, initial encounter: Secondary | ICD-10-CM

## 2016-12-28 MED ORDER — ACETAMINOPHEN 325 MG PO TABS
650.0000 mg | ORAL_TABLET | Freq: Once | ORAL | Status: AC
  Administered 2016-12-28: 650 mg via ORAL
  Filled 2016-12-28: qty 2

## 2016-12-28 MED ORDER — IBUPROFEN 800 MG PO TABS
800.0000 mg | ORAL_TABLET | Freq: Once | ORAL | Status: AC
  Administered 2016-12-28: 800 mg via ORAL
  Filled 2016-12-28: qty 1
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
## 2016-12-28 NOTE — Discharge Instructions (Signed)
Your vital signs within normal limits.  Your oxygen level is 99% on room air.  The x-ray is negative for fracture or dislocation.  And there are no gross neurological or vascular problems noted on your examination.  Please apply an ice pack.  Please use 600 mg of ibuprofen and 500 mg of Tylenol every 6 hours for soreness.  Please see Dr. Romeo AppleHarrison for orthopedic management if this is not improving.

## 2016-12-28 NOTE — ED Provider Notes (Signed)
[facility] EMERGENCY DEPARTMENT Provider Note   CSN: [id number] Arrival date & time: 12/28/16  1553     History   Chief Complaint Chief Complaint  Patient presents with  . Shoulder Pain    HPI Thomas Mackintoshresean J Buster is a 21 y.o. male.  Patient is a 21 year old male who presents to the emergency department with left shoulder area pain.  Patient states that on last evening he had a box that was dropped on his left clavicle shoulder area.  The patient has been having increasing pain since that time and today he feels that he has pain with every movement of his shoulder.  He is concerned that he has a broken bone.  No previous history of injury.  No recent operations or procedures involving the shoulder area.      History reviewed. No pertinent past medical history.  There are no active problems to display for this patient.   History reviewed. No pertinent surgical history.     Home Medications    Prior to Admission medications   Medication Sig Start Date End Date Taking? Authorizing Provider  ibuprofen (ADVIL,MOTRIN) 600 MG tablet Take 1 tablet (600 mg total) by mouth every 6 (six) hours as needed. 09/18/15   Burgess AmorIdol, Julie, PA-C  traMADol (ULTRAM) 50 MG tablet Take 1 tablet (50 mg total) by mouth every 6 (six) hours as needed. 09/18/15   Burgess AmorIdol, Julie, PA-C    Family History History reviewed. No pertinent family history.  Social History Social History   Tobacco Use  . Smoking status: Never Smoker  . Smokeless tobacco: Never Used  Substance Use Topics  . Alcohol use: No  . Drug use: No     Allergies   Patient has no known allergies.   Review of Systems Review of Systems  Constitutional: Negative for activity change.       All ROS Neg except as noted in HPI  HENT: Negative for nosebleeds.   Eyes: Negative for photophobia and discharge.  Respiratory: Negative for cough, shortness of breath and wheezing.   Cardiovascular: Negative for chest pain and  palpitations.  Gastrointestinal: Negative for abdominal pain and blood in stool.  Genitourinary: Negative for dysuria, frequency and hematuria.  Musculoskeletal: Positive for arthralgias. Negative for back pain and neck pain.       Shoulder/clavicle pain  Skin: Negative.   Neurological: Negative for dizziness, seizures and speech difficulty.  Psychiatric/Behavioral: Negative for confusion and hallucinations.     Physical Exam Updated Vital Signs BP 127/81 (BP Location: Right Arm)   Pulse 64   Temp 98.7 F (37.1 C) (Oral)   Resp 18   Ht 5\' 11"  (1.803 m)   Wt 74.8 kg (165 lb)   SpO2 99%   BMI 23.01 kg/m   Physical Exam  Constitutional: He is oriented to person, place, and time. He appears well-developed and well-nourished.  Non-toxic appearance.  HENT:  Head: Normocephalic.  Right Ear: Tympanic membrane and external ear normal.  Left Ear: Tympanic membrane and external ear normal.  Eyes: EOM and lids are normal. Pupils are equal, round, and reactive to light.  Neck: Normal range of motion. Neck supple. Carotid bruit is not present.  Cardiovascular: Normal rate, regular rhythm, normal heart sounds, intact distal pulses and normal pulses.  Pulmonary/Chest: Breath sounds normal. No respiratory distress.  Abdominal: Soft. Bowel sounds are normal. There is no tenderness. There is no guarding.  Musculoskeletal: Normal range of motion.       Left shoulder: He  exhibits tenderness and pain. He exhibits no crepitus, no deformity and normal strength.       Arms: There is full range of motion of the fingers of the left upper extremity.  Capillary refill is less than 2 seconds.  Radial pulses 2+.  There is full range of motion of the left wrist and elbow.  There is good range of motion of the left shoulder, but with pain.  There is no palpable deformity of the clavicle or the shoulder area.  There is no deformity of the scapula.  Lymphadenopathy:       Head (right side): No submandibular  adenopathy present.       Head (left side): No submandibular adenopathy present.    He has no cervical adenopathy.  Neurological: He is alert and oriented to person, place, and time. He has normal strength. No cranial nerve deficit or sensory deficit.  Grip is symmetrical.  There is no asymmetry of motor strength of the upper extremities.  There are no sensory deficits appreciated.  Skin: Skin is warm and dry.  Psychiatric: He has a normal mood and affect. His speech is normal.  Nursing note and vitals reviewed.    ED Treatments / Results  Labs (all labs ordered are listed, but only abnormal results are displayed) Labs Reviewed - No data to display  EKG  EKG Interpretation None       Radiology No results found.  Procedures Procedures (including critical care time)  Medications Ordered in ED Medications - No data to display   Initial Impression / Assessment and Plan / ED Course  I have reviewed the triage vital signs and the nursing notes.  Pertinent labs & imaging results that were available during my care of the patient were reviewed by me and considered in my medical decision making (see chart for details).       Final Clinical Impressions(s) / ED Diagnoses MDM Vital signs reviewed.  Pulse oximetry is 99% on room air.  Within normal limits by my interpretation.  The patient has symmetrical rise and fall of the chest, and speaks in complete sentences.  There is soreness with range of motion of the shoulder, but no palpable deformity.  No vascular changes noted.  X-ray of the clavicle shoulder area is negative for acute problem.  Of asked the patient to use ibuprofen and or Tylenol every 6 hours for soreness.  Patient will be given an ice pack to use.  He will be given the name of orthopedic on call if  to evaluate if this is not improving.   Final diagnoses:  Contusion of left clavicle, initial encounter    ED Discharge Orders    None       Ivery QualeBryant, Masin Shatto,  PA-C 12/28/16 1654    Ivery QualeBryant, Armoni Depass, PA-C 01/07/17 0047    Donnetta Hutchingook, Brian, MD 01/08/17 1251

## 2017-02-01 DIAGNOSIS — E663 Overweight: Secondary | ICD-10-CM | POA: Diagnosis not present

## 2017-02-01 DIAGNOSIS — Z6827 Body mass index (BMI) 27.0-27.9, adult: Secondary | ICD-10-CM | POA: Diagnosis not present

## 2017-02-01 DIAGNOSIS — Z0000 Encounter for general adult medical examination without abnormal findings: Secondary | ICD-10-CM | POA: Diagnosis not present

## 2017-02-01 DIAGNOSIS — Z1389 Encounter for screening for other disorder: Secondary | ICD-10-CM | POA: Diagnosis not present

## 2017-02-03 ENCOUNTER — Other Ambulatory Visit: Payer: Self-pay

## 2017-02-03 ENCOUNTER — Encounter (HOSPITAL_COMMUNITY): Payer: Self-pay | Admitting: Emergency Medicine

## 2017-02-03 ENCOUNTER — Emergency Department (HOSPITAL_COMMUNITY)
Admission: EM | Admit: 2017-02-03 | Discharge: 2017-02-03 | Disposition: A | Discharge status: Home / Self Care | Payer: Self-pay | Attending: Emergency Medicine | Admitting: Emergency Medicine

## 2017-02-03 DIAGNOSIS — W268XXA Contact with other sharp object(s), not elsewhere classified, initial encounter: Secondary | ICD-10-CM | POA: Insufficient documentation

## 2017-02-03 DIAGNOSIS — Y929 Unspecified place or not applicable: Secondary | ICD-10-CM | POA: Insufficient documentation

## 2017-02-03 DIAGNOSIS — S61212A Laceration without foreign body of right middle finger without damage to nail, initial encounter: Secondary | ICD-10-CM | POA: Insufficient documentation

## 2017-02-03 DIAGNOSIS — Y999 Unspecified external cause status: Secondary | ICD-10-CM | POA: Insufficient documentation

## 2017-02-03 DIAGNOSIS — Y9389 Activity, other specified: Secondary | ICD-10-CM | POA: Insufficient documentation

## 2017-02-03 MED ORDER — TRAMADOL HCL 50 MG PO TABS: 50.0000 mg | ORAL_TABLET | Freq: Four times a day (QID) | ORAL | 0 refills | Status: DC | PRN

## 2017-02-03 MED ORDER — POVIDONE-IODINE 10 % EX SOLN
CUTANEOUS | Status: AC
  Filled 2017-02-03: qty 15

## 2017-02-03 MED ORDER — LIDOCAINE HCL (PF) 2 % IJ SOLN: 2.0000 mL | Freq: Once | INTRAMUSCULAR | Status: DC

## 2017-02-03 MED ORDER — LIDOCAINE HCL (PF) 2 % IJ SOLN
INTRAMUSCULAR | Status: AC
  Filled 2017-02-03: qty 10

## 2017-02-03 NOTE — Discharge Instructions (Signed)
Have your sutures removed in 10 days.  Keep your wound clean and dry,  Until a good scab forms - you may then wash gently twice daily with mild soap and water, but dry completely after.  Get rechecked for any sign of infection (redness,  Swelling,  Increased pain or drainage of purulent fluid). ° °

## 2017-02-03 NOTE — ED Triage Notes (Signed)
Pt has laceration to the end of right middle finger from onion slicer.

## 2017-02-04 NOTE — ED Provider Notes (Signed)
[facility] EMERGENCY DEPARTMENT Provider Note   CSN: [id number] Arrival date & time: 02/03/17  1754     History   Chief Complaint Chief Complaint  Patient presents with  . Laceration    HPI Thomas Valdez is a 23 y.o. male presenting with a laceration to his right distal long finger which occurred just prior to arrival.  He picked up his mother's onion slicer which contains multiple very sharp blades and accidentally sliced his distal long finger with the object.  He has been unable to control the bleeding and reports throbbing pain at the site since the event.  He is current on his tetanus vaccine.  He denies numbness distal to the injury site.  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  HPI  History reviewed. No pertinent past medical history.  There are no active problems to display for this patient.   History reviewed. No pertinent surgical history.     Home Medications    Prior to Admission medications   Medication Sig Start Date End Date Taking? Authorizing Provider  ibuprofen (ADVIL,MOTRIN) 600 MG tablet Take 1 tablet (600 mg total) by mouth every 6 (six) hours as needed. 09/18/15   Burgess Amor, PA-C  traMADol (ULTRAM) 50 MG tablet Take 1 tablet (50 mg total) by mouth every 6 (six) hours as needed. 02/03/17   Burgess Amor, PA-C    Family History History reviewed. No pertinent family history.  Social History Social History   Tobacco Use  . Smoking status: Never Smoker  . Smokeless tobacco: Never Used  Substance Use Topics  . Alcohol use: No  . Drug use: No     Allergies   Patient has no known allergies.   Review of Systems Review of Systems  Constitutional: Negative for chills and fever.  Musculoskeletal: Negative for arthralgias.  Skin: Positive for wound.  Neurological: Negative for numbness.     Physical Exam Updated Vital Signs BP 128/85   Pulse 61   Temp 98.6 F (37 C)   Resp 17   Ht 5\' 11"  (1.803 m)   Wt 83.9 kg (185 lb)    SpO2 99%   BMI 25.80 kg/m   Physical Exam  Constitutional: He is oriented to person, place, and time. He appears well-developed and well-nourished.  HENT:  Head: Normocephalic.  Cardiovascular: Normal rate.  Pulmonary/Chest: Effort normal.  Musculoskeletal: He exhibits no tenderness.  Neurological: He is alert and oriented to person, place, and time. No sensory deficit.  Skin: Laceration noted.  2 cm linear laceration volar distal right long finger.  The wound is well approximated and currently hemostatic.  The wound appears clean.     ED Treatments / Results  Labs (all labs ordered are listed, but only abnormal results are displayed) Labs Reviewed - No data to display  EKG  EKG Interpretation None       Radiology No results found.  Procedures Procedures (including critical care time)  LACERATION REPAIR Performed by: Burgess Amor Authorized by: Burgess Amor Consent: Verbal consent obtained. Risks and benefits: risks, benefits and alternatives were discussed Consent given by: patient Patient identity confirmed: provided demographic data Prepped and Draped in normal sterile fashion Wound explored  Laceration Location: Right long finger  Laceration Length: 2 cm  No Foreign Bodies seen or palpated  Anesthesia: Digital block Local anesthetic: lidocaine 2 % without epinephrine  Anesthetic total: 1.5 ml  Irrigation method: syringe Amount of cleaning: standard  Skin closure: Ethilon 4-0  Number of  sutures: 5  Technique: Simple interrupted  Patient tolerance: Patient tolerated the procedure well with no immediate complications.   Medications Ordered in ED Medications - No data to display   Initial Impression / Assessment and Plan / ED Course  I have reviewed the triage vital signs and the nursing notes.  Pertinent labs & imaging results that were available during my care of the patient were reviewed by me and considered in my medical decision making  (see chart for details).     Wound care instructions given.  Pt advised to have sutures removed in 10 days,  Return here sooner for any signs of infection including redness, swelling, worse pain or drainage of pus.  Dressing was provided.  We also gave him an aluminum finger splint molded over the tip of the finger to protect his wound while at work.     Final Clinical Impressions(s) / ED Diagnoses   Final diagnoses:  Laceration of right middle finger without foreign body without damage to nail, initial encounter    ED Discharge Orders        Ordered    traMADol (ULTRAM) 50 MG tablet  Every 6 hours PRN     02/03/17 2225       Burgess Amordol, Clodagh Odenthal, PA-C 02/04/17 0150    Burgess AmorIdol, Nalea Salce, PA-C 02/04/17 0150    Long, Arlyss RepressJoshua G, MD 02/04/17 [phone]

## 2017-02-07 DIAGNOSIS — R739 Hyperglycemia, unspecified: Secondary | ICD-10-CM | POA: Diagnosis not present

## 2017-02-14 DIAGNOSIS — Z6826 Body mass index (BMI) 26.0-26.9, adult: Secondary | ICD-10-CM | POA: Diagnosis not present

## 2017-02-14 DIAGNOSIS — E663 Overweight: Secondary | ICD-10-CM | POA: Diagnosis not present

## 2017-02-14 DIAGNOSIS — Z4802 Encounter for removal of sutures: Secondary | ICD-10-CM | POA: Diagnosis not present

## 2017-02-16 DIAGNOSIS — Z681 Body mass index (BMI) 19 or less, adult: Secondary | ICD-10-CM | POA: Diagnosis not present

## 2017-02-16 DIAGNOSIS — A601 Herpesviral infection of perianal skin and rectum: Secondary | ICD-10-CM | POA: Diagnosis not present

## 2017-08-31 DIAGNOSIS — Z1389 Encounter for screening for other disorder: Secondary | ICD-10-CM | POA: Diagnosis not present

## 2017-08-31 DIAGNOSIS — E663 Overweight: Secondary | ICD-10-CM | POA: Diagnosis not present

## 2017-08-31 DIAGNOSIS — Z6828 Body mass index (BMI) 28.0-28.9, adult: Secondary | ICD-10-CM | POA: Diagnosis not present

## 2017-08-31 DIAGNOSIS — R5383 Other fatigue: Secondary | ICD-10-CM | POA: Diagnosis not present

## 2017-12-21 DIAGNOSIS — E663 Overweight: Secondary | ICD-10-CM | POA: Diagnosis not present

## 2017-12-21 DIAGNOSIS — Z6827 Body mass index (BMI) 27.0-27.9, adult: Secondary | ICD-10-CM | POA: Diagnosis not present

## 2017-12-21 DIAGNOSIS — Z1389 Encounter for screening for other disorder: Secondary | ICD-10-CM | POA: Diagnosis not present

## 2017-12-21 DIAGNOSIS — M25511 Pain in right shoulder: Secondary | ICD-10-CM | POA: Diagnosis not present

## 2018-08-17 ENCOUNTER — Encounter (HOSPITAL_COMMUNITY): Payer: Self-pay | Admitting: Emergency Medicine

## 2018-08-17 ENCOUNTER — Emergency Department (HOSPITAL_COMMUNITY): Payer: Self-pay

## 2018-08-17 ENCOUNTER — Other Ambulatory Visit: Payer: Self-pay

## 2018-08-17 ENCOUNTER — Emergency Department (HOSPITAL_COMMUNITY)
Admission: EM | Admit: 2018-08-17 | Discharge: 2018-08-17 | Disposition: A | Discharge status: Home / Self Care | Payer: Self-pay | Attending: Emergency Medicine | Admitting: Emergency Medicine

## 2018-08-17 DIAGNOSIS — N200 Calculus of kidney: Secondary | ICD-10-CM | POA: Insufficient documentation

## 2018-08-17 LAB — CBC WITH DIFFERENTIAL/PLATELET
Abs Immature Granulocytes: 0.01 10*3/uL (ref 0.00–0.07)
Basophils Absolute: 0 10*3/uL (ref 0.0–0.1)
Basophils Relative: 0 %
Eosinophils Absolute: 0 10*3/uL (ref 0.0–0.5)
Eosinophils Relative: 0 %
HCT: 46 % (ref 39.0–52.0)
Hemoglobin: 14.9 g/dL (ref 13.0–17.0)
Immature Granulocytes: 0 %
Lymphocytes Relative: 21 %
Lymphs Abs: 1.6 10*3/uL (ref 0.7–4.0)
MCH: 30 pg (ref 26.0–34.0)
MCHC: 32.4 g/dL (ref 30.0–36.0)
MCV: 92.7 fL (ref 80.0–100.0)
Monocytes Absolute: 0.5 10*3/uL (ref 0.1–1.0)
Monocytes Relative: 6 %
Neutro Abs: 5.7 10*3/uL (ref 1.7–7.7)
Neutrophils Relative %: 73 %
Platelets: 343 10*3/uL (ref 150–400)
RBC: 4.96 MIL/uL (ref 4.22–5.81)
RDW: 12.4 % (ref 11.5–15.5)
WBC: 7.9 10*3/uL (ref 4.0–10.5)
nRBC: 0 % (ref 0.0–0.2)

## 2018-08-17 LAB — COMPREHENSIVE METABOLIC PANEL WITH GFR
ALT: 29 U/L (ref 0–44)
AST: 24 U/L (ref 15–41)
Albumin: 4.8 g/dL (ref 3.5–5.0)
Alkaline Phosphatase: 72 U/L (ref 38–126)
Anion gap: 11 (ref 5–15)
BUN: 19 mg/dL (ref 6–20)
CO2: 25 mmol/L (ref 22–32)
Calcium: 9.8 mg/dL (ref 8.9–10.3)
Chloride: 103 mmol/L (ref 98–111)
Creatinine, Ser: 1.06 mg/dL (ref 0.61–1.24)
GFR calc Af Amer: >60 mL/min (ref >60)
GFR calc non Af Amer: >60 mL/min (ref >60)
Glucose, Bld: 100 mg/dL — ABNORMAL HIGH (ref 70–99)
Potassium: 4.1 mmol/L (ref 3.5–5.1)
Sodium: 139 mmol/L (ref 135–145)
Total Bilirubin: 0.7 mg/dL (ref 0.3–1.2)
Total Protein: 8.4 g/dL — ABNORMAL HIGH (ref 6.5–8.1)

## 2018-08-17 LAB — URINALYSIS, ROUTINE W REFLEX MICROSCOPIC
Glucose, UA: NEGATIVE mg/dL
Ketones, ur: NEGATIVE mg/dL
Leukocytes,Ua: NEGATIVE
Nitrite: NEGATIVE
Protein, ur: 30 mg/dL — AB
Specific Gravity, Urine: >1.03 — ABNORMAL HIGH (ref 1.005–1.030)
pH: 6 (ref 5.0–8.0)

## 2018-08-17 LAB — URINALYSIS, MICROSCOPIC (REFLEX): RBC / HPF: >50 RBC/hpf (ref 0–5)

## 2018-08-17 MED ORDER — OXYCODONE-ACETAMINOPHEN 5-325 MG PO TABS: 1.0000 | ORAL_TABLET | Freq: Four times a day (QID) | ORAL | 0 refills | Status: DC | PRN

## 2018-08-17 MED ORDER — CEPHALEXIN 500 MG PO CAPS: 500.0000 mg | ORAL_CAPSULE | Freq: Four times a day (QID) | ORAL | 0 refills | Status: DC

## 2018-08-17 MED ORDER — TAMSULOSIN HCL 0.4 MG PO CAPS: 0.4000 mg | ORAL_CAPSULE | Freq: Every day | ORAL | 0 refills | Status: DC

## 2018-08-17 MED ORDER — ONDANSETRON 4 MG PO TBDP: ORAL_TABLET | ORAL | 0 refills | Status: DC

## 2018-08-17 MED ORDER — CEPHALEXIN 500 MG PO CAPS
500.0000 mg | ORAL_CAPSULE | Freq: Once | ORAL | Status: AC
  Administered 2018-08-17: 500 mg via ORAL
  Filled 2018-08-17: qty 1

## 2018-08-17 MED ORDER — HYDROMORPHONE HCL 1 MG/ML IJ SOLN
0.5000 mg | Freq: Once | INTRAMUSCULAR | Status: AC
  Administered 2018-08-17: 23:00:00 0.5 mg via INTRAVENOUS
  Filled 2018-08-17: qty 1

## 2018-08-17 MED ORDER — TAMSULOSIN HCL 0.4 MG PO CAPS
0.4000 mg | ORAL_CAPSULE | Freq: Once | ORAL | Status: AC
  Administered 2018-08-17: 0.4 mg via ORAL
  Filled 2018-08-17: qty 1

## 2018-08-17 MED ORDER — ONDANSETRON HCL 4 MG/2ML IJ SOLN
4.0000 mg | Freq: Once | INTRAMUSCULAR | Status: AC
  Administered 2018-08-17: 4 mg via INTRAVENOUS
  Filled 2018-08-17: qty 2

## 2018-08-17 MED ORDER — KETOROLAC TROMETHAMINE 30 MG/ML IJ SOLN
30.0000 mg | Freq: Once | INTRAMUSCULAR | Status: AC
  Administered 2018-08-17: 30 mg via INTRAVENOUS
  Filled 2018-08-17: qty 1

## 2018-08-17 MED ORDER — HYDROMORPHONE HCL 1 MG/ML IJ SOLN
0.5000 mg | Freq: Once | INTRAMUSCULAR | Status: AC
  Administered 2018-08-17: 0.5 mg via INTRAVENOUS
  Filled 2018-08-17: qty 1

## 2018-08-17 NOTE — ED Notes (Signed)
Patient informed a urine sample is needed at this time. 

## 2018-08-17 NOTE — Discharge Instructions (Addendum)
Call tomorrow to make an appointment to follow-up with [facility] next week

## 2018-08-17 NOTE — ED Provider Notes (Signed)
[facility] EMERGENCY DEPARTMENT Provider Note   CSN: [id number] Arrival date & time: 08/17/18  1753     History   Chief Complaint Chief Complaint  Patient presents with  . Abdominal Pain    HPI Thomas Valdez is a 23 y.o. male.     Patient complains of right flank pain and groin pain that started today.  The pain is severe.  The history is provided by the patient. No language interpreter was used.  Abdominal Pain Pain location:  R flank Pain quality: aching   Pain radiates to:  Does not radiate Pain severity:  Severe Onset quality:  Sudden Timing:  Constant Progression:  Worsening Chronicity:  New Context: not alcohol use   Relieved by:  Nothing Worsened by:  Nothing Associated symptoms: no chest pain, no cough, no diarrhea, no fatigue and no hematuria     History reviewed. No pertinent past medical history.  There are no active problems to display for this patient.   History reviewed. No pertinent surgical history.      Home Medications    Prior to Admission medications   Medication Sig Start Date End Date Taking? Authorizing Provider  cephALEXin (KEFLEX) 500 MG capsule Take 1 capsule (500 mg total) by mouth 4 (four) times daily. 08/17/18   Milton Ferguson, MD  ondansetron (ZOFRAN ODT) 4 MG disintegrating tablet 4mg  ODT q4 hours prn nausea/vomit 08/17/18   Milton Ferguson, MD  oxyCODONE-acetaminophen (PERCOCET) 5-325 MG tablet Take 1 tablet by mouth every 6 (six) hours as needed. 08/17/18   Milton Ferguson, MD  oxyCODONE-acetaminophen (PERCOCET/ROXICET) 5-325 MG tablet Take 1 tablet by mouth every 6 (six) hours as needed. 08/17/18   Milton Ferguson, MD  tamsulosin (FLOMAX) 0.4 MG CAPS capsule Take 1 capsule (0.4 mg total) by mouth daily. 08/17/18   Milton Ferguson, MD    Family History No family history on file.  Social History Social History   Tobacco Use  . Smoking status: Never Smoker  . Smokeless tobacco: Never Used  Substance Use Topics  . Alcohol  use: No  . Drug use: No     Allergies   Patient has no known allergies.   Review of Systems Review of Systems  Constitutional: Negative for appetite change and fatigue.  HENT: Negative for congestion, ear discharge and sinus pressure.   Eyes: Negative for discharge.  Respiratory: Negative for cough.   Cardiovascular: Negative for chest pain.  Gastrointestinal: Positive for abdominal pain. Negative for diarrhea.  Genitourinary: Negative for frequency and hematuria.       Right flank pain  Musculoskeletal: Negative for back pain.  Skin: Negative for rash.  Neurological: Negative for seizures and headaches.  Psychiatric/Behavioral: Negative for hallucinations.     Physical Exam Updated Vital Signs BP 125/85   Pulse (!) 57   Temp 98.7 F (37.1 C) (Oral)   Resp 18   Ht 5\' 10"  (1.778 m)   Wt 86.2 kg   SpO2 96%   BMI 27.26 kg/m   Physical Exam Vitals signs and nursing note reviewed.  Constitutional:      Appearance: He is well-developed.  HENT:     Head: Normocephalic.     Nose: Nose normal.  Eyes:     General: No scleral icterus.    Conjunctiva/sclera: Conjunctivae normal.  Neck:     Musculoskeletal: Neck supple.     Thyroid: No thyromegaly.  Cardiovascular:     Rate and Rhythm: Normal rate and regular rhythm.     Heart  sounds: No murmur. No friction rub. No gallop.   Pulmonary:     Breath sounds: No stridor. No wheezing or rales.  Chest:     Chest wall: No tenderness.  Abdominal:     General: There is no distension.     Tenderness: There is no abdominal tenderness. There is no rebound.  Genitourinary:    Comments: Tender right flank Musculoskeletal: Normal range of motion.  Lymphadenopathy:     Cervical: No cervical adenopathy.  Skin:    Findings: No erythema or rash.  Neurological:     Mental Status: He is oriented to person, place, and time.     Motor: No abnormal muscle tone.     Coordination: Coordination normal.  Psychiatric:        Behavior:  Behavior normal.      ED Treatments / Results  Labs (all labs ordered are listed, but only abnormal results are displayed) Labs Reviewed  COMPREHENSIVE METABOLIC PANEL - Abnormal; Notable for the following components:      Result Value   Glucose, Bld 100 (*)    Total Protein 8.4 (*)    All other components within normal limits  URINALYSIS, ROUTINE W REFLEX MICROSCOPIC - Abnormal; Notable for the following components:   Color, Urine BROWN (*)    APPearance TURBID (*)    Specific Gravity, Urine >1.030 (*)    Hgb urine dipstick LARGE (*)    Bilirubin Urine SMALL (*)    Protein, ur 30 (*)    All other components within normal limits  URINALYSIS, MICROSCOPIC (REFLEX) - Abnormal; Notable for the following components:   Bacteria, UA MANY (*)    All other components within normal limits  URINE CULTURE  CBC WITH DIFFERENTIAL/PLATELET    EKG None  Radiology Ct Renal Stone Study  Result Date: 08/17/2018 CLINICAL DATA:  Right-sided flank pain EXAM: CT ABDOMEN AND PELVIS WITHOUT CONTRAST TECHNIQUE: Multidetector CT imaging of the abdomen and pelvis was performed following the standard protocol without IV contrast. COMPARISON:  None. FINDINGS: Lower chest: No acute abnormality. Hepatobiliary: No focal liver abnormality is seen. No gallstones, gallbladder wall thickening, or biliary dilatation. Pancreas: Unremarkable. No pancreatic ductal dilatation or surrounding inflammatory changes. Spleen: Normal in size without focal abnormality. Adrenals/Urinary Tract: Adrenal glands are within normal limits. Scattered nonobstructing renal calculi are noted on the left. The right kidney demonstrates mild hydronephrosis and hydroureter which extends to the level of the pelvic inlet where a 3 mm stone is identified best seen on image number 67 of series 2. The more distal right ureter is within normal limits. The bladder is within limits. Stomach/Bowel: Stomach is within normal limits. Appendix appears normal.  No evidence of bowel wall thickening, distention, or inflammatory changes. Vascular/Lymphatic: No significant vascular findings are present. No enlarged abdominal or pelvic lymph nodes. Reproductive: Prostate is unremarkable. Other: No abdominal wall hernia or abnormality. No abdominopelvic ascites. Musculoskeletal: No acute or significant osseous findings. IMPRESSION: 3 mm right mid ureteral stone with obstructive change. Scattered small nonobstructing left renal calculi. Electronically Signed   By: Alcide CleverMark  Lukens M.D.   On: 08/17/2018 21:11    Procedures Procedures (including critical care time)  Medications Ordered in ED Medications  HYDROmorphone (DILAUDID) injection 0.5 mg (has no administration in time range)  tamsulosin (FLOMAX) capsule 0.4 mg (has no administration in time range)  cephALEXin (KEFLEX) capsule 500 mg (has no administration in time range)  ondansetron (ZOFRAN) injection 4 mg (4 mg Intravenous Given 08/17/18 2024)  ketorolac (TORADOL)  30 MG/ML injection 30 mg (30 mg Intravenous Given 08/17/18 2026)  HYDROmorphone (DILAUDID) injection 0.5 mg (0.5 mg Intravenous Given 08/17/18 2027)     Initial Impression / Assessment and Plan / ED Course  I have reviewed the triage vital signs and the nursing notes.  Pertinent labs & imaging results that were available during my care of the patient were reviewed by me and considered in my medical decision making (see chart for details).        Labs unremarkable except for urinalysis shows bacteria.  And urinalysis shows blood.  CT scan shows kidney stone on the right.  Labs reviewed and CT scan reviewed.  Patient will be sent home with Percocet Zofran Flomax Keflex and follow-up with urology  Final Clinical Impressions(s) / ED Diagnoses   Final diagnoses:  Kidney stone    ED Discharge Orders         Ordered    oxyCODONE-acetaminophen (PERCOCET/ROXICET) 5-325 MG tablet  Every 6 hours PRN     08/17/18 2218    oxyCODONE-acetaminophen  (PERCOCET) 5-325 MG tablet  Every 6 hours PRN     08/17/18 2218    cephALEXin (KEFLEX) 500 MG capsule  4 times daily     08/17/18 2218    tamsulosin (FLOMAX) 0.4 MG CAPS capsule  Daily     08/17/18 2218    ondansetron (ZOFRAN ODT) 4 MG disintegrating tablet     08/17/18 2218           Bethann BerkshireZammit, Zykera Abella, MD 08/17/18 2222

## 2018-08-17 NOTE — ED Triage Notes (Signed)
Lower abd pain, lower back pain and RT testicle pain since 0930 today.  Urine is dark brown color.

## 2018-08-18 MED FILL — Oxycodone w/ Acetaminophen Tab 5-325 MG: ORAL | Qty: 6 | Status: AC

## 2018-08-19 LAB — URINE CULTURE: Culture: NO GROWTH

## 2018-09-20 DIAGNOSIS — Z1389 Encounter for screening for other disorder: Secondary | ICD-10-CM | POA: Diagnosis not present

## 2018-09-20 DIAGNOSIS — Z6829 Body mass index (BMI) 29.0-29.9, adult: Secondary | ICD-10-CM | POA: Diagnosis not present

## 2018-09-20 DIAGNOSIS — R2231 Localized swelling, mass and lump, right upper limb: Secondary | ICD-10-CM | POA: Diagnosis not present

## 2018-09-20 DIAGNOSIS — E663 Overweight: Secondary | ICD-10-CM | POA: Diagnosis not present

## 2018-09-28 DIAGNOSIS — R2231 Localized swelling, mass and lump, right upper limb: Secondary | ICD-10-CM | POA: Diagnosis not present

## 2018-10-18 ENCOUNTER — Other Ambulatory Visit: Payer: Self-pay

## 2018-10-18 DIAGNOSIS — M7989 Other specified soft tissue disorders: Secondary | ICD-10-CM | POA: Diagnosis not present

## 2018-10-18 DIAGNOSIS — D2111 Benign neoplasm of connective and other soft tissue of right upper limb, including shoulder: Secondary | ICD-10-CM | POA: Diagnosis not present

## 2018-10-18 DIAGNOSIS — R2231 Localized swelling, mass and lump, right upper limb: Secondary | ICD-10-CM | POA: Diagnosis not present

## 2019-01-09 ENCOUNTER — Ambulatory Visit: Payer: Medicaid Other | Attending: Internal Medicine

## 2019-01-09 ENCOUNTER — Other Ambulatory Visit: Payer: Self-pay

## 2019-01-09 DIAGNOSIS — Z20822 Contact with and (suspected) exposure to covid-19: Secondary | ICD-10-CM

## 2019-01-09 DIAGNOSIS — Z20828 Contact with and (suspected) exposure to other viral communicable diseases: Secondary | ICD-10-CM | POA: Diagnosis not present

## 2019-01-10 LAB — NOVEL CORONAVIRUS, NAA: SARS-CoV-2, NAA: NOT DETECTED

## 2019-08-02 DIAGNOSIS — E663 Overweight: Secondary | ICD-10-CM | POA: Diagnosis not present

## 2019-08-02 DIAGNOSIS — Z6828 Body mass index (BMI) 28.0-28.9, adult: Secondary | ICD-10-CM | POA: Diagnosis not present

## 2019-08-02 DIAGNOSIS — Z0000 Encounter for general adult medical examination without abnormal findings: Secondary | ICD-10-CM | POA: Diagnosis not present

## 2020-06-10 DIAGNOSIS — J029 Acute pharyngitis, unspecified: Secondary | ICD-10-CM | POA: Diagnosis not present

## 2020-07-11 ENCOUNTER — Ambulatory Visit
Admission: EM | Admit: 2020-07-11 | Discharge: 2020-07-11 | Disposition: A | Discharge status: Home / Self Care | Payer: BC Managed Care – PPO | Attending: Emergency Medicine | Admitting: Emergency Medicine

## 2020-07-11 ENCOUNTER — Encounter: Payer: Self-pay | Admitting: Emergency Medicine

## 2020-07-11 DIAGNOSIS — R109 Unspecified abdominal pain: Secondary | ICD-10-CM | POA: Insufficient documentation

## 2020-07-11 DIAGNOSIS — R1031 Right lower quadrant pain: Secondary | ICD-10-CM | POA: Insufficient documentation

## 2020-07-11 LAB — POCT URINALYSIS DIP (MANUAL ENTRY)
Bilirubin, UA: NEGATIVE
Blood, UA: NEGATIVE
Glucose, UA: 100 mg/dL — AB
Ketones, POC UA: NEGATIVE mg/dL
Leukocytes, UA: NEGATIVE
Nitrite, UA: POSITIVE — AB
Protein Ur, POC: NEGATIVE mg/dL
Spec Grav, UA: 1.015 (ref 1.010–1.025)
Urobilinogen, UA: 1 E.U./dL
pH, UA: 8.5 — AB (ref 5.0–8.0)

## 2020-07-11 MED ORDER — KETOROLAC TROMETHAMINE 60 MG/2ML IM SOLN
60.0000 mg | Freq: Once | INTRAMUSCULAR | Status: AC
  Administered 2020-07-11: 60 mg via INTRAMUSCULAR

## 2020-07-11 MED ORDER — ONDANSETRON HCL 4 MG PO TABS: 4.0000 mg | ORAL_TABLET | Freq: Four times a day (QID) | ORAL | 0 refills | Status: DC

## 2020-07-11 MED ORDER — TAMSULOSIN HCL 0.4 MG PO CAPS: 0.4000 mg | ORAL_CAPSULE | Freq: Every day | ORAL | 0 refills | Status: DC

## 2020-07-11 MED ORDER — MELOXICAM 15 MG PO TABS: 15.0000 mg | ORAL_TABLET | Freq: Every day | ORAL | 0 refills | Status: DC

## 2020-07-11 NOTE — ED Provider Notes (Signed)
[facility]   CC: Flank pain  SUBJECTIVE:  Thomas Valdez is a 25 y.o. male who complains of RLQ and RT flank pain with aching in testicles that started last night.  Patient denies a precipitating event.  Admits to history of kidney stones.  Pain is intermittent and describes it as achy.  Has tried OTC AZO without relief.  Symptoms are made worse with urination.  Admits to similar symptoms in the past.  Denies fever, chills, nausea, vomiting.    LMP: No LMP for male patient.  ROS: As in HPI.  All other pertinent ROS negative.     History reviewed. No pertinent past medical history. History reviewed. No pertinent surgical history. No Known Allergies No current facility-administered medications on file prior to encounter.   No current outpatient medications on file prior to encounter.   Social History   Socioeconomic History   Marital status: Single    Spouse name: Not on file   Number of children: Not on file   Years of education: Not on file   Highest education level: Not on file  Occupational History   Not on file  Tobacco Use   Smoking status: Never   Smokeless tobacco: Never  Vaping Use   Vaping Use: Never used  Substance and Sexual Activity   Alcohol use: No   Drug use: No   Sexual activity: Not on file  Other Topics Concern   Not on file  Social History Narrative   Not on file   Social Determinants of Health   Financial Resource Strain: Not on file  Food Insecurity: Not on file  Transportation Needs: Not on file  Physical Activity: Not on file  Stress: Not on file  Social Connections: Not on file  Intimate Partner Violence: Not on file   No family history on file.  OBJECTIVE:  Vitals:   07/11/20 1535  BP: 116/74  Pulse: 64  Resp: 18  Temp: 98.3 F (36.8 C)  TempSrc: Oral  SpO2: 96%   General appearance: AOx3 in no acute distress HEENT: NCAT.  Oropharynx clear.  Lungs: clear to auscultation bilaterally without adventitious  breath sounds Heart: regular rate and rhythm.  Abdomen: soft; non-distended; TTP over RLQ; bowel sounds present; no guarding Extremities: no edema; symmetrical with no gross deformities Skin: warm and dry Neurologic: Ambulates from chair to exam table without difficulty Psychological: alert and cooperative; normal mood and affect  Labs Reviewed  POCT URINALYSIS DIP (MANUAL ENTRY) - Abnormal; Notable for the following components:      Result Value   Color, UA orange (*)    Glucose, UA =100 (*)    pH, UA 8.5 (*)    Nitrite, UA Positive (*)    All other components within normal limits  URINE CULTURE    ASSESSMENT & PLAN:  1. Right flank pain   2. RLQ abdominal pain     Meds ordered this encounter  Medications   tamsulosin (FLOMAX) 0.4 MG CAPS capsule    Sig: Take 1 capsule (0.4 mg total) by mouth daily after breakfast.    Dispense:  30 capsule    Refill:  0    Order Specific Question:   Supervising Provider    Answer:   Eustace Moore [[id number]]   ondansetron (ZOFRAN) 4 MG tablet    Sig: Take 1 tablet (4 mg total) by mouth every 6 (six) hours.    Dispense:  12 tablet    Refill:  0  Order Specific Question:   Supervising Provider    Answer:   Eustace Moore [[id number]]   meloxicam (MOBIC) 15 MG tablet    Sig: Take 1 tablet (15 mg total) by mouth daily.    Dispense:  20 tablet    Refill:  0    Order Specific Question:   Supervising Provider    Answer:   Eustace Moore [[id number]]   ketorolac (TORADOL) injection 60 mg   Toradol shot given in office Drink plenty of fluids and get rest Strain all urine and follow up with PCP with specimen Use flomax as directed  Use OTC mobic as needed for pain.   Follow up with PCP if symptoms persist Return or go to ER if you have any new or worsening symptoms (difficulty urinating, blood in urine, pain that does not moderate with medication, fever, chills, abdominal pain, etc...)   Outlined signs and symptoms indicating need  for more acute intervention. Patient verbalized understanding. After Visit Summary given.      Rennis Harding, PA-C 07/11/20 [phone]

## 2020-07-11 NOTE — Discharge Instructions (Addendum)
Unable to rule out appendicitis in urgent care setting.  Offered patient further evaluation and management in the ED.  Patient declines at this time and would like to try outpatient therapy first.  Aware of the risk associated with this decision including missed diagnosis, organ damage, organ failure, and/or death.  Patient aware and in agreement.     Toradol shot given in office Drink plenty of fluids and get rest Strain all urine and follow up with PCP with specimen Use flomax as directed  Use OTC mobic as needed for pain.   Follow up with PCP if symptoms persist Return or go to ER if you have any new or worsening symptoms (difficulty urinating, blood in urine, pain that does not moderate with medication, fever, chills, abdominal pain, etc...)

## 2020-07-11 NOTE — ED Triage Notes (Signed)
RT flank pain, RLQ pain and achy feeling in testicles that started last night.  Pt states he has had this pain before when he had kidney stones last year.

## 2020-07-13 LAB — URINE CULTURE: Culture: 20000 — AB

## 2020-07-14 ENCOUNTER — Emergency Department (HOSPITAL_COMMUNITY): Payer: BC Managed Care – PPO

## 2020-07-14 ENCOUNTER — Emergency Department (HOSPITAL_COMMUNITY)
Admission: EM | Admit: 2020-07-14 | Discharge: 2020-07-15 | Disposition: A | Discharge status: Home / Self Care | Payer: BC Managed Care – PPO | Attending: Emergency Medicine | Admitting: Emergency Medicine

## 2020-07-14 ENCOUNTER — Encounter (HOSPITAL_COMMUNITY): Payer: Self-pay

## 2020-07-14 ENCOUNTER — Other Ambulatory Visit: Payer: Self-pay

## 2020-07-14 DIAGNOSIS — R1031 Right lower quadrant pain: Secondary | ICD-10-CM | POA: Diagnosis not present

## 2020-07-14 DIAGNOSIS — K5792 Diverticulitis of intestine, part unspecified, without perforation or abscess without bleeding: Secondary | ICD-10-CM

## 2020-07-14 DIAGNOSIS — K5730 Diverticulosis of large intestine without perforation or abscess without bleeding: Secondary | ICD-10-CM | POA: Diagnosis not present

## 2020-07-14 DIAGNOSIS — R109 Unspecified abdominal pain: Secondary | ICD-10-CM

## 2020-07-14 LAB — COMPREHENSIVE METABOLIC PANEL
ALT: 18 U/L (ref 0–44)
AST: 22 U/L (ref 15–41)
Albumin: 4.2 g/dL (ref 3.5–5.0)
Alkaline Phosphatase: 69 U/L (ref 38–126)
Anion gap: 5 (ref 5–15)
BUN: 22 mg/dL — ABNORMAL HIGH (ref 6–20)
CO2: 27 mmol/L (ref 22–32)
Calcium: 8.7 mg/dL — ABNORMAL LOW (ref 8.9–10.3)
Chloride: 103 mmol/L (ref 98–111)
Creatinine, Ser: 0.9 mg/dL (ref 0.61–1.24)
GFR, Estimated: >60 mL/min (ref >60)
Glucose, Bld: 88 mg/dL (ref 70–99)
Potassium: 3.5 mmol/L (ref 3.5–5.1)
Sodium: 135 mmol/L (ref 135–145)
Total Bilirubin: 0.5 mg/dL (ref 0.3–1.2)
Total Protein: 7.6 g/dL (ref 6.5–8.1)

## 2020-07-14 LAB — CBC
HCT: 43.2 % (ref 39.0–52.0)
Hemoglobin: 14.3 g/dL (ref 13.0–17.0)
MCH: 30.9 pg (ref 26.0–34.0)
MCHC: 33.1 g/dL (ref 30.0–36.0)
MCV: 93.3 fL (ref 80.0–100.0)
Platelets: 293 10*3/uL (ref 150–400)
RBC: 4.63 MIL/uL (ref 4.22–5.81)
RDW: 12.2 % (ref 11.5–15.5)
WBC: 7.1 10*3/uL (ref 4.0–10.5)
nRBC: 0 % (ref 0.0–0.2)

## 2020-07-14 LAB — LIPASE, BLOOD: Lipase: 30 U/L (ref 11–51)

## 2020-07-14 MED ORDER — MORPHINE SULFATE (PF) 4 MG/ML IV SOLN
4.0000 mg | Freq: Once | INTRAVENOUS | Status: AC
  Administered 2020-07-14: 4 mg via INTRAVENOUS
  Filled 2020-07-14: qty 1

## 2020-07-14 MED ORDER — SODIUM CHLORIDE 0.9 % IV BOLUS
1000.0000 mL | Freq: Once | INTRAVENOUS | Status: AC
  Administered 2020-07-14: 1000 mL via INTRAVENOUS

## 2020-07-14 MED ORDER — IOHEXOL 300 MG/ML  SOLN
100.0000 mL | Freq: Once | INTRAMUSCULAR | Status: AC | PRN
  Administered 2020-07-14: 100 mL via INTRAVENOUS

## 2020-07-14 MED ORDER — KETOROLAC TROMETHAMINE 30 MG/ML IJ SOLN
30.0000 mg | Freq: Once | INTRAMUSCULAR | Status: AC
  Administered 2020-07-14: 30 mg via INTRAVENOUS
  Filled 2020-07-14: qty 1

## 2020-07-14 MED ORDER — ONDANSETRON HCL 4 MG/2ML IJ SOLN
4.0000 mg | Freq: Once | INTRAMUSCULAR | Status: AC
  Administered 2020-07-14: 4 mg via INTRAVENOUS
  Filled 2020-07-14: qty 2

## 2020-07-14 NOTE — ED Provider Notes (Signed)
[facility] EMERGENCY DEPARTMENT Provider Note   CSN: [id number] Arrival date & time: 07/14/20  1941     History Chief Complaint  Patient presents with   Abdominal Pain    Thomas Valdez is a 25 y.o. male.  Patient with right-sided flank pain and abdominal pain for the past 4 days.  States the pain started in his right low back and radiated to his right abdomen.  He was seen in urgent care and told he had possible kidney stones.  He was treated with meloxicam and Zofran.  States he no longer has back pain but has right-sided abdominal pain worse in the right lower quadrant and periumbilical area.  Has had a kidney stone in the past but this feels different.  Nausea but no vomiting.  No pain with urination or blood in the urine.  No change in bowel habits.  No fever.  No chest pain or shortness of breath. Still has appendix and gallbladder.  Pain radiated to the right testicles previously but now it is not doing that.  Sleeping makes the pain better.  Pain has been fairly constant since Friday worse in the right lower quadrant.  Still has an appetite.  Still moving his bowels normally.  No testicular pain.  The history is provided by the patient.  Abdominal Pain Associated symptoms: fever and nausea   Associated symptoms: no chest pain, no cough, no diarrhea, no dysuria, no fatigue, no hematuria, no shortness of breath and no vomiting       History reviewed. No pertinent past medical history.  There are no problems to display for this patient.   History reviewed. No pertinent surgical history.     History reviewed. No pertinent family history.  Social History   Tobacco Use   Smoking status: Never   Smokeless tobacco: Never  Vaping Use   Vaping Use: Never used  Substance Use Topics   Alcohol use: No   Drug use: No    Home Medications Prior to Admission medications   Medication Sig Start Date End Date Taking? Authorizing Provider  meloxicam (MOBIC) 15 MG tablet  Take 1 tablet (15 mg total) by mouth daily. 07/11/20  Yes Wurst, [place], PA-C  ondansetron (ZOFRAN) 4 MG tablet Take 1 tablet (4 mg total) by mouth every 6 (six) hours. 07/11/20  Yes Wurst, [place], PA-C  tamsulosin (FLOMAX) 0.4 MG CAPS capsule Take 1 capsule (0.4 mg total) by mouth daily after breakfast. 07/11/20  Yes Wurst, [place], PA-C    Allergies    Patient has no known allergies.  Review of Systems   Review of Systems  Constitutional:  Positive for activity change, appetite change and fever. Negative for fatigue.  HENT:  Negative for congestion and rhinorrhea.   Eyes:  Negative for visual disturbance.  Respiratory:  Negative for cough, chest tightness and shortness of breath.   Cardiovascular:  Negative for chest pain and leg swelling.  Gastrointestinal:  Positive for abdominal pain and nausea. Negative for diarrhea and vomiting.  Genitourinary:  Negative for dysuria and hematuria.  Musculoskeletal:  Positive for back pain. Negative for arthralgias and myalgias.  Skin:  Negative for rash.  Neurological:  Negative for dizziness, weakness and headaches.   all other systems are negative except as noted in the HPI and PMH.   Physical Exam Updated Vital Signs BP 124/79   Pulse 87   Temp 98.5 F (36.9 C)   Resp 19   Ht 5\' 10"  (1.778 m)   Wt  86 kg   SpO2 99%   BMI 27.19 kg/m   Physical Exam Vitals and nursing note reviewed.  Constitutional:      General: He is not in acute distress.    Appearance: He is well-developed.  HENT:     Head: Normocephalic and atraumatic.     Mouth/Throat:     Pharynx: No oropharyngeal exudate.  Eyes:     Conjunctiva/sclera: Conjunctivae normal.     Pupils: Pupils are equal, round, and reactive to light.  Neck:     Comments: No meningismus. Cardiovascular:     Rate and Rhythm: Normal rate and regular rhythm.     Heart sounds: Normal heart sounds. No murmur heard. Pulmonary:     Effort: Pulmonary effort is normal. No respiratory distress.      Breath sounds: Normal breath sounds.  Abdominal:     Palpations: Abdomen is soft.     Tenderness: There is abdominal tenderness. There is guarding. There is no rebound.     Comments: Tender in the right lower quadrant and periumbilical area with voluntary guarding  Genitourinary:    Comments: No CVA tenderness.  No testicular tenderness Musculoskeletal:        General: No tenderness. Normal range of motion.     Cervical back: Normal range of motion and neck supple.  Skin:    General: Skin is warm.  Neurological:     Mental Status: He is alert and oriented to person, place, and time.     Cranial Nerves: No cranial nerve deficit.     Motor: No abnormal muscle tone.     Coordination: Coordination normal.     Comments: No ataxia on finger to nose bilaterally. No pronator drift. 5/5 strength throughout. CN 2-12 intact.Equal grip strength. Sensation intact.   Psychiatric:        Behavior: Behavior normal.    ED Results / Procedures / Treatments   Labs (all labs ordered are listed, but only abnormal results are displayed) Labs Reviewed  COMPREHENSIVE METABOLIC PANEL - Abnormal; Notable for the following components:      Result Value   BUN 22 (*)    Calcium 8.7 (*)    All other components within normal limits  LIPASE, BLOOD  CBC  URINALYSIS, ROUTINE W REFLEX MICROSCOPIC    EKG None  Radiology CT ABDOMEN PELVIS W CONTRAST  Result Date: 07/15/2020 CLINICAL DATA:  Right lower quadrant abdominal pain EXAM: CT ABDOMEN AND PELVIS WITH CONTRAST TECHNIQUE: Multidetector CT imaging of the abdomen and pelvis was performed using the standard protocol following bolus administration of intravenous contrast. CONTRAST:  OMNIPAQUE IOHEXOL 300 MG/ML  SOLN COMPARISON:  08/17/2018 FINDINGS: Lower chest: Lung bases are clear. Hepatobiliary: Liver is within normal limits. Gallbladder is unremarkable. No intrahepatic or extrahepatic ductal dilatation. Pancreas: Within normal limits. Spleen:  Within normal limits. Adrenals/Urinary Tract: Adrenal glands are within normal limits. Kidneys are within normal limits.  No hydronephrosis. Bladder is within normal limits. Stomach/Bowel: Stomach is within normal limits. No evidence of bowel obstruction. Normal appendix (series 2/image 55). Segmental wall thickening (series 2/image 60) with mild pericolonic inflammatory changes (series 2/image 64) involving the proximal sigmoid colon, favoring sigmoid colitis or diverticulitis. Vascular/Lymphatic: No evidence of abdominal aortic aneurysm. No suspicious abdominopelvic lymphadenopathy. Reproductive: Prostate is unremarkable. Other: No abdominopelvic ascites. Musculoskeletal: Visualized osseous structures are within normal limits. IMPRESSION: Segmental wall thickening with mild pericolonic inflammatory changes involving the proximal sigmoid colon, favoring sigmoid colitis or diverticulitis. No evidence of bowel obstruction.  Normal appendix. Electronically Signed   By: Charline Bills M.D.   On: 07/15/2020 00:22    Procedures Procedures   Medications Ordered in ED Medications  sodium chloride 0.9 % bolus 1,000 mL (has no administration in time range)  morphine 4 MG/ML injection 4 mg (has no administration in time range)  ketorolac (TORADOL) 30 MG/ML injection 30 mg (has no administration in time range)  ondansetron (ZOFRAN) injection 4 mg (has no administration in time range)    ED Course  I have reviewed the triage vital signs and the nursing notes.  Pertinent labs & imaging results that were available during my care of the patient were reviewed by me and considered in my medical decision making (see chart for details).    MDM Rules/Calculators/A&P                          4 days of flank pain with nausea which has since moved to his right lower quadrant.  No urinary symptoms.  Will evaluate for appendicitis  No leukocytosis.  CT scan shows normal appendix.  No kidney stone.  There is some  inflammation of the sigmoid colon however.  This is the opposite side of patient's pain.  Consider passed kidney stone.  No evidence of appendicitis at this time.  We will treat for possible diverticulitis of the sigmoid colon. Patient did give urine sample but does not want to wait for results.  Antibiotics will treat any possible UTI as well as diverticulitis. No evidence of retained obstructing kidney stone. Discussed that sometimes appendicitis does not show up right away on CT scan. Advise return to the ED sooner with worsening pain especially on the right side, nausea, vomiting, fever or any other concerns. Final Clinical Impression(s) / ED Diagnoses Final diagnoses:  Flank pain  Diverticulitis    Rx / DC Orders ED Discharge Orders          Ordered    amoxicillin-clavulanate (AUGMENTIN) 875-125 MG tablet  Every 12 hours        07/15/20 0358    ondansetron (ZOFRAN ODT) 4 MG disintegrating tablet  Every 8 hours PRN        07/15/20 0358             Glynn Octave, MD 07/15/20 [phone]

## 2020-07-14 NOTE — ED Triage Notes (Signed)
C/o R sided abd pain, seen last week at [facility] for same.

## 2020-07-15 DIAGNOSIS — R109 Unspecified abdominal pain: Secondary | ICD-10-CM | POA: Diagnosis not present

## 2020-07-15 LAB — URINALYSIS, ROUTINE W REFLEX MICROSCOPIC
Bilirubin Urine: NEGATIVE
Glucose, UA: NEGATIVE mg/dL
Hgb urine dipstick: NEGATIVE
Ketones, ur: 40 mg/dL — AB
Leukocytes,Ua: NEGATIVE
Nitrite: NEGATIVE
Protein, ur: NEGATIVE mg/dL
Specific Gravity, Urine: 1.015 (ref 1.005–1.030)
pH: 5.5 (ref 5.0–8.0)

## 2020-07-15 MED ORDER — METRONIDAZOLE 500 MG/100ML IV SOLN
500.0000 mg | Freq: Once | INTRAVENOUS | Status: AC
  Administered 2020-07-15: 500 mg via INTRAVENOUS
  Filled 2020-07-15: qty 100

## 2020-07-15 MED ORDER — AMOXICILLIN-POT CLAVULANATE 875-125 MG PO TABS: 1.0000 | ORAL_TABLET | Freq: Two times a day (BID) | ORAL | 0 refills | Status: DC

## 2020-07-15 MED ORDER — ONDANSETRON 4 MG PO TBDP: 4.0000 mg | ORAL_TABLET | Freq: Three times a day (TID) | ORAL | 0 refills | Status: DC | PRN

## 2020-07-15 MED ORDER — SODIUM CHLORIDE 0.9 % IV SOLN
1.0000 g | Freq: Once | INTRAVENOUS | Status: AC
  Administered 2020-07-15: 1 g via INTRAVENOUS
  Filled 2020-07-15: qty 10

## 2020-07-15 MED ORDER — OXYCODONE-ACETAMINOPHEN 5-325 MG PO TABS
2.0000 | ORAL_TABLET | Freq: Once | ORAL | Status: AC
  Administered 2020-07-15: 2 via ORAL
  Filled 2020-07-15: qty 2

## 2020-07-15 NOTE — Discharge Instructions (Addendum)
As we discussed, you may have passed a kidney stone.  Your appendix appears to be normal.  Take the antibiotics as prescribed for diverticulitis.  Follow-up with your doctor.  Return to the ED with worsening pain especially on the right side of your stomach, fever, vomiting, unable to eat or drink or any other concerns.

## 2020-09-24 DIAGNOSIS — E663 Overweight: Secondary | ICD-10-CM | POA: Diagnosis not present

## 2020-09-24 DIAGNOSIS — Z0000 Encounter for general adult medical examination without abnormal findings: Secondary | ICD-10-CM | POA: Diagnosis not present

## 2020-09-24 DIAGNOSIS — Z1331 Encounter for screening for depression: Secondary | ICD-10-CM | POA: Diagnosis not present

## 2020-09-24 DIAGNOSIS — Z6827 Body mass index (BMI) 27.0-27.9, adult: Secondary | ICD-10-CM | POA: Diagnosis not present

## 2020-09-24 DIAGNOSIS — G4709 Other insomnia: Secondary | ICD-10-CM | POA: Diagnosis not present

## 2021-01-21 ENCOUNTER — Ambulatory Visit (INDEPENDENT_AMBULATORY_CARE_PROVIDER_SITE_OTHER): Payer: No Typology Code available for payment source | Admitting: Nurse Practitioner

## 2021-01-21 ENCOUNTER — Other Ambulatory Visit: Payer: Self-pay

## 2021-01-21 ENCOUNTER — Encounter: Payer: Self-pay | Admitting: Nurse Practitioner

## 2021-01-21 VITALS — BP 125/69 | HR 84 | Temp 98.4°F | Resp 20 | Ht 70.0 in | Wt 179.0 lb

## 2021-01-21 DIAGNOSIS — Z0000 Encounter for general adult medical examination without abnormal findings: Secondary | ICD-10-CM | POA: Insufficient documentation

## 2021-01-21 DIAGNOSIS — Z7689 Persons encountering health services in other specified circumstances: Secondary | ICD-10-CM | POA: Insufficient documentation

## 2021-01-21 NOTE — Assessment & Plan Note (Signed)
Patient establishing care today, no new concerns, completed assessment

## 2021-01-21 NOTE — Progress Notes (Signed)
.  New Patient Note  RE: Thomas Valdez MRN: [id number] DOB: 10/29/1995 Date of Office Visit: 01/21/2021  Chief Complaint: Establish Care and Annual Exam  History of Present Illness: .   Encounter for general adult medical examination without abnormal findings  Physical: Patient's last physical exam was 1 year ago .  Weight: Appropriate for height (BMI less than 27%) ;  Blood Pressure: Normal (BP less than 120/80) ;  Medical History: Patient history reviewed ; Family history reviewed ;  Allergies Reviewed: No change in current allergies ;  Medications Reviewed: Medications reviewed - no changes ;  Lipids: Normal lipid levels ; labs completed results pending Smoking: Life-long non-smoker ;  Physical Activity: Exercises at least 3 times per week ; Yes Alcohol/Drug Use: Is a non-drinker ; No illicit drug use ;  Patient is not afflicted from Stress Incontinence and Urge Incontinence  Safety: reviewed ; Patient wears a seat belt, has smoke detectors, has carbon monoxide detectors, practices appropriate gun safety, and wears sunscreen with extended sun exposure. Dental Care: annual cleanings, brushes and flosses daily. Ophthalmology/Optometry: Annual visit.  Hearing loss: none Vision impairments: none   Assessment and Plan: Thomas Valdez is a 26 y.o. male with: Establishing care with new doctor, encounter for Patient establishing care today, no new concerns, completed assessment  Annual physical exam Annual physical exam completed with labs:CBC, CMP, Lipid panel. Results pending.  Education provided on preventative and health maintenance. Printed hand out given  Return in about 1 year (around 01/21/2022).   Diagnostics:   Past Medical History: Patient Active Problem List   Diagnosis Date Noted   Establishing care with new doctor, encounter for 01/21/2021   Annual physical exam 01/21/2021   History reviewed. No pertinent past medical history. Past Surgical History: History  reviewed. No pertinent surgical history. Medication List:  Current Outpatient Medications  Medication Sig Dispense Refill   traZODone (DESYREL) 50 MG tablet Take 50 mg by mouth at bedtime as needed for sleep.     No current facility-administered medications for this visit.   Allergies: No Known Allergies Social History: Social History   Socioeconomic History   Marital status: Single    Spouse name: Not on file   Number of children: Not on file   Years of education: Not on file   Highest education level: Not on file  Occupational History   Not on file  Tobacco Use   Smoking status: Never   Smokeless tobacco: Never  Vaping Use   Vaping Use: Never used  Substance and Sexual Activity   Alcohol use: Yes    Comment: social   Drug use: No   Sexual activity: Not on file  Other Topics Concern   Not on file  Social History Narrative   Not on file   Social Determinants of Health   Financial Resource Strain: Not on file  Food Insecurity: Not on file  Transportation Needs: Not on file  Physical Activity: Not on file  Stress: Not on file  Social Connections: Not on file       Family History: Family History  Problem Relation Age of Onset   Cancer Father        prostate   Heart disease Maternal Grandmother          Review of Systems  Constitutional: Negative.   HENT: Negative.    Eyes: Negative.   Respiratory: Negative.    Cardiovascular: Negative.   Gastrointestinal: Negative.   Musculoskeletal: Negative.   Skin:  Negative  for rash.  Hematological: Negative.   Psychiatric/Behavioral: Negative.    All other systems reviewed and are negative. Objective: BP 125/69    Pulse 84    Temp 98.4 F (36.9 C) (Oral)    Resp 20    Ht 5\' 10"  (1.778 m)    Wt 179 lb (81.2 kg)    SpO2 99%    BMI 25.68 kg/m  Body mass index is 25.68 kg/m. Physical Exam Vitals and nursing note reviewed.  HENT:     Right Ear: Ear canal and external ear normal.     Left Ear: Ear canal and  external ear normal.     Mouth/Throat:     Mouth: Mucous membranes are moist.     Pharynx: Oropharynx is clear.  Eyes:     Conjunctiva/sclera: Conjunctivae normal.  Cardiovascular:     Pulses: Normal pulses.     Heart sounds: Normal heart sounds.  Pulmonary:     Effort: Pulmonary effort is normal.  Abdominal:     General: Bowel sounds are normal.  Skin:    General: Skin is warm.     Findings: No rash.  Neurological:     General: No focal deficit present.     Mental Status: He is oriented to person, place, and time.  Psychiatric:        Behavior: Behavior normal.   The plan was reviewed with the patient/family, and all questions/concerned were addressed.  It was my pleasure to see Thomas Valdez today and participate in his care. Please feel free to contact me with any questions or concerns.  Sincerely,  Fritz Pickerel NP [facility]

## 2021-01-21 NOTE — Assessment & Plan Note (Signed)
Annual physical exam completed with labs:CBC, CMP, Lipid panel. Results pending.  Education provided on preventative and health maintenance. Printed hand out given

## 2021-01-21 NOTE — Patient Instructions (Signed)

## 2021-01-22 LAB — LIPID PANEL
Chol/HDL Ratio: 3.7 ratio (ref 0.0–5.0)
Cholesterol, Total: 176 mg/dL (ref 100–199)
HDL: 48 mg/dL (ref >39)
LDL Chol Calc (NIH): 121 mg/dL — ABNORMAL HIGH (ref 0–99)
Triglycerides: 36 mg/dL (ref 0–149)
VLDL Cholesterol Cal: 7 mg/dL (ref 5–40)

## 2021-01-22 LAB — COMPREHENSIVE METABOLIC PANEL
ALT: 18 IU/L (ref 0–44)
AST: 26 IU/L (ref 0–40)
Albumin/Globulin Ratio: 1.8 (ref 1.2–2.2)
Albumin: 4.9 g/dL (ref 4.1–5.2)
Alkaline Phosphatase: 71 IU/L (ref 44–121)
BUN/Creatinine Ratio: 19 (ref 9–20)
BUN: 20 mg/dL (ref 6–20)
Bilirubin Total: 0.4 mg/dL (ref 0.0–1.2)
CO2: 23 mmol/L (ref 20–29)
Calcium: 9.5 mg/dL (ref 8.7–10.2)
Chloride: 101 mmol/L (ref 96–106)
Creatinine, Ser: 1.07 mg/dL (ref 0.76–1.27)
Globulin, Total: 2.8 g/dL (ref 1.5–4.5)
Glucose: 93 mg/dL (ref 70–99)
Potassium: 4.8 mmol/L (ref 3.5–5.2)
Sodium: 142 mmol/L (ref 134–144)
Total Protein: 7.7 g/dL (ref 6.0–8.5)
eGFR: 98 mL/min/{1.73_m2} (ref >59)

## 2021-01-22 LAB — CBC WITH DIFFERENTIAL/PLATELET
Basophils Absolute: 0 10*3/uL (ref 0.0–0.2)
Basos: 1 %
EOS (ABSOLUTE): 0 10*3/uL (ref 0.0–0.4)
Eos: 1 %
Hematocrit: 46.7 % (ref 37.5–51.0)
Hemoglobin: 15.4 g/dL (ref 13.0–17.7)
Immature Grans (Abs): 0 10*3/uL (ref 0.0–0.1)
Immature Granulocytes: 0 %
Lymphocytes Absolute: 1.5 10*3/uL (ref 0.7–3.1)
Lymphs: 39 %
MCH: 29.1 pg (ref 26.6–33.0)
MCHC: 33 g/dL (ref 31.5–35.7)
MCV: 88 fL (ref 79–97)
Monocytes Absolute: 0.3 10*3/uL (ref 0.1–0.9)
Monocytes: 8 %
Neutrophils Absolute: 1.9 10*3/uL (ref 1.4–7.0)
Neutrophils: 51 %
Platelets: 340 10*3/uL (ref 150–450)
RBC: 5.3 x10E6/uL (ref 4.14–5.80)
RDW: 12.6 % (ref 11.6–15.4)
WBC: 3.7 10*3/uL (ref 3.4–10.8)

## 2021-01-22 LAB — HIV ANTIBODY (ROUTINE TESTING W REFLEX): HIV Screen 4th Generation wRfx: NONREACTIVE

## 2021-01-22 LAB — HEPATITIS C ANTIBODY: Hep C Virus Ab: <0.1 s/co ratio (ref 0.0–0.9)

## 2021-02-17 ENCOUNTER — Other Ambulatory Visit: Payer: Self-pay

## 2021-02-17 MED ORDER — VALACYCLOVIR HCL 1 G PO TABS
ORAL_TABLET | ORAL | 10 refills | Status: DC
  Filled 2021-05-05: qty 20, 10d supply, fill #0
  Filled 2021-06-15: qty 20, 10d supply, fill #1

## 2021-02-17 MED ORDER — TRAZODONE HCL 50 MG PO TABS
ORAL_TABLET | ORAL | 2 refills | Status: DC
  Filled 2021-02-17: qty 90, 90d supply, fill #0

## 2021-02-19 ENCOUNTER — Other Ambulatory Visit: Payer: Self-pay

## 2021-04-15 ENCOUNTER — Encounter: Payer: Self-pay | Admitting: Nurse Practitioner

## 2021-04-20 ENCOUNTER — Encounter: Payer: Self-pay | Admitting: Nurse Practitioner

## 2021-04-20 ENCOUNTER — Ambulatory Visit (INDEPENDENT_AMBULATORY_CARE_PROVIDER_SITE_OTHER): Payer: No Typology Code available for payment source | Admitting: Nurse Practitioner

## 2021-04-20 VITALS — BP 127/71 | HR 78 | Ht 70.0 in | Wt 191.0 lb

## 2021-04-20 DIAGNOSIS — Z202 Contact with and (suspected) exposure to infections with a predominantly sexual mode of transmission: Secondary | ICD-10-CM | POA: Diagnosis not present

## 2021-04-20 NOTE — Progress Notes (Signed)
? ?Acute Office Visit ? ?Subjective:  ? ? Patient ID: Thomas Valdez, male    DOB: Feb 23, 1995, 26 y.o.   MRN: [id number] ? ?Chief Complaint  ?Patient presents with  ? STD check  ?  Former partner positive for syphylis ?  ? ? ?Exposure to STD ?The patient's pertinent negatives include no genital injury, genital itching, pelvic pain or penile discharge. This is a recurrent problem. The current episode started 1 to 4 weeks ago. The problem has been unchanged. The patient is experiencing no pain. Pertinent negatives include no abdominal pain, chest pain, chills, coughing, fever, flank pain, headaches, nausea or rash. Nothing aggravates the symptoms. He has tried nothing for the symptoms. He is sexually active. Yes, his partner has an STD. His past medical history is significant for syphilis.  ? ? ? ?Family History  ?Problem Relation Age of Onset  ? Cancer Father   ?     prostate  ? Heart disease Maternal Grandmother   ? ? ?Social History  ? ?Socioeconomic History  ? Marital status: Single  ?  Spouse name: Not on file  ? Number of children: Not on file  ? Years of education: Not on file  ? Highest education level: Not on file  ?Occupational History  ? Not on file  ?Tobacco Use  ? Smoking status: Never  ? Smokeless tobacco: Never  ?Vaping Use  ? Vaping Use: Never used  ?Substance and Sexual Activity  ? Alcohol use: Yes  ?  Comment: social  ? Drug use: No  ? Sexual activity: Not on file  ?Other Topics Concern  ? Not on file  ?Social History Narrative  ? Not on file  ? ?Social Determinants of Health  ? ?Financial Resource Strain: Not on file  ?Food Insecurity: Not on file  ?Transportation Needs: Not on file  ?Physical Activity: Not on file  ?Stress: Not on file  ?Social Connections: Not on file  ?Intimate Partner Violence: Not on file  ? ? ?Outpatient Medications Prior to Visit  ?Medication Sig Dispense Refill  ? traZODone (DESYREL) 50 MG tablet Take 50 mg by mouth at bedtime as needed for sleep.    ? traZODone  (DESYREL) 50 MG tablet Take 1 tablet by mouth daily 90 tablet 2  ? valACYclovir (VALTREX) 1000 MG tablet Take 1 tablet by mouth twice a day 20 tablet 10  ? ?No facility-administered medications prior to visit.  ? ? ?No Known Allergies ? ?Review of Systems  ?Constitutional:  Negative for chills and fever.  ?Respiratory:  Negative for cough.   ?Cardiovascular:  Negative for chest pain.  ?Gastrointestinal:  Negative for abdominal pain and nausea.  ?Genitourinary:  Negative for flank pain, pelvic pain and penile discharge.  ?Skin:  Negative for rash.  ?Neurological:  Negative for headaches.  ?All other systems reviewed and are negative. ? ?   ?Objective:  ?  ?Physical Exam ?Vitals and nursing note reviewed.  ?Constitutional:   ?   Appearance: Normal appearance.  ?HENT:  ?   Head: Normocephalic.  ?   Right Ear: External ear normal.  ?   Left Ear: External ear normal.  ?   Nose: Nose normal.  ?   Mouth/Throat:  ?   Mouth: Mucous membranes are moist.  ?   Pharynx: Oropharynx is clear.  ?Eyes:  ?   Conjunctiva/sclera: Conjunctivae normal.  ?Cardiovascular:  ?   Rate and Rhythm: Normal rate and regular rhythm.  ?   Pulses: Normal pulses.  ?  Heart sounds: Normal heart sounds.  ?Pulmonary:  ?   Effort: Pulmonary effort is normal.  ?   Breath sounds: Normal breath sounds.  ?Abdominal:  ?   General: Bowel sounds are normal.  ?Skin: ?   General: Skin is warm.  ?   Findings: No rash.  ?Neurological:  ?   General: No focal deficit present.  ?   Mental Status: He is alert and oriented to person, place, and time.  ?Psychiatric:     ?   Mood and Affect: Mood normal.     ?   Behavior: Behavior normal.  ? ? ?BP 127/71   Pulse 78   Ht 5' 10"  (1.778 m)   Wt 191 lb (86.6 kg)   SpO2 97%   BMI 27.41 kg/m?  ?Wt Readings from Last 3 Encounters:  ?04/20/21 191 lb (86.6 kg)  ?01/21/21 179 lb (81.2 kg)  ?07/14/20 189 lb 8 oz (86 kg)  ? ? ?Health Maintenance Due  ?Topic Date Due  ? COVID-19 Vaccine (1) Never done  ? HPV VACCINES (1 - Male  2-dose series) Never done  ? ? ?   ?Topic Date Due  ? HPV VACCINES (1 - Male 2-dose series) Never done  ? ? ? ?No results found for: TSH ?Lab Results  ?Component Value Date  ? WBC 3.7 01/21/2021  ? HGB 15.4 01/21/2021  ? HCT 46.7 01/21/2021  ? MCV 88 01/21/2021  ? PLT 340 01/21/2021  ? ?Lab Results  ?Component Value Date  ? NA 142 01/21/2021  ? K 4.8 01/21/2021  ? CO2 23 01/21/2021  ? GLUCOSE 93 01/21/2021  ? BUN 20 01/21/2021  ? CREATININE 1.07 01/21/2021  ? BILITOT 0.4 01/21/2021  ? ALKPHOS 71 01/21/2021  ? AST 26 01/21/2021  ? ALT 18 01/21/2021  ? PROT 7.7 01/21/2021  ? ALBUMIN 4.9 01/21/2021  ? CALCIUM 9.5 01/21/2021  ? ANIONGAP 5 07/14/2020  ? EGFR 98 01/21/2021  ? ?Lab Results  ?Component Value Date  ? CHOL 176 01/21/2021  ? ?Lab Results  ?Component Value Date  ? HDL 48 01/21/2021  ? ?Lab Results  ?Component Value Date  ? LDLCALC 121 (H) 01/21/2021  ? ?Lab Results  ?Component Value Date  ? TRIG 36 01/21/2021  ? ?Lab Results  ?Component Value Date  ? CHOLHDL 3.7 01/21/2021  ? ?No results found for: HGBA1C ? ?   ?Assessment & Plan:  ?No signs and symptoms of STD assessed today.  Patient is in clinic today due to a phone call received from partner positive for syphilis.  Completed labs [STD] results pending.  Education provided to patient on safe sex.  And information on PreP initiation by infectious disease clinic. ? ? ?Problem List Items Addressed This Visit   ?None ?Visit Diagnoses   ? ? Exposure to STD    -  Primary  ? Relevant Orders  ? Ct, Ng, Mycoplasmas NAA, Urine  ? ?  ? ? ? ?No orders of the defined types were placed in this encounter. ? ? ? ?Ivy Lynn, NP ? ?

## 2021-04-20 NOTE — Patient Instructions (Signed)
Safe Sex Practicing safe sex means taking steps before and during sex to reduce your risk of: Getting an STI (sexually transmitted infection). Giving your partner an STI. Unwanted or unplanned pregnancy. How to practice safe sex Ways you can practice safe sex  Limit your sexual partners to only one partner who is having sex with only you. Avoid using alcohol and drugs before having sex. Alcohol and drugs can affect your judgment. Before having sex with a new partner: Talk to your partner about past partners, past STIs, and drug use. Get screened for STIs and discuss the results with your partner. Ask your partner to get screened too. Check your body regularly for sores, blisters, rashes, or unusual discharge. If you notice any of these problems, visit your health care provider. Avoid sexual contact if you have symptoms of an infection or you are being treated for an STI. While having sex, use a condom. Make sure to: Use a condom every time you have vaginal, oral, or anal sex. Both females and males should wear condoms during oral sex. Keep condoms in place from the beginning to the end of sexual activity. Use a latex condom, if possible. Latex condoms offer the best protection. Use only water-based lubricants with a condom. Using petroleum-based lubricants or oils will weaken the condom and increase the chance that it will break. Ways your health care provider can help you practice safe sex  See your health care provider for regular screenings, exams, and tests for STIs. Talk with your health care provider about what kind of birth control (contraception) is best for you. Get vaccinated against hepatitis B and human papillomavirus (HPV). If you are at risk of being infected with HIV (human immunodeficiency virus), talk with your health care provider about taking a prescription medicine to prevent HIV infection. You are at risk for HIV if you: Are a man who has sex with other men. Are  sexually active with more than one partner. Take drugs by injection. Have a sex partner who has HIV. Have unprotected sex. Have sex with someone who has sex with both men and women. Have had an STI. Follow these instructions at home: Take over-the-counter and prescription medicines only as told by your health care provider. Keep all follow-up visits. This is important. Where to find more information Centers for Disease Control and Prevention: www.cdc.gov Planned Parenthood: www.plannedparenthood.org Office on Women's Health: www.womenshealth.gov Summary Practicing safe sex means taking steps before and during sex to reduce your risk getting an STI, giving your partner an STI, and having an unwanted or unplanned pregnancy. Before having sex with a new partner, talk to your partner about past partners, past STIs, and drug use. Use a condom every time you have vaginal, oral, or anal sex. Both females and males should wear condoms during oral sex. Check your body regularly for sores, blisters, rashes, or unusual discharge. If you notice any of these problems, visit your health care provider. See your health care provider for regular screenings, exams, and tests for STIs. This information is not intended to replace advice given to you by your health care provider. Make sure you discuss any questions you have with your health care provider. Document Revised: 06/04/2019 Document Reviewed: 06/04/2019 Elsevier Patient Education  2022 Elsevier Inc.  

## 2021-04-21 ENCOUNTER — Telehealth: Payer: Self-pay | Admitting: Nurse Practitioner

## 2021-04-21 NOTE — Telephone Encounter (Signed)
Patient calling to check on lab results. Please review and call back.  

## 2021-04-21 NOTE — Telephone Encounter (Signed)
Labs are note back yet we will call her once they come in please tell patient this when she calls back. ?

## 2021-04-22 NOTE — Telephone Encounter (Signed)
Detailed message left per signed DPR according to  nurse supervisor Berton Lan ?

## 2021-04-23 LAB — CT, NG, MYCOPLASMAS NAA, URINE
Chlamydia trachomatis, NAA: NEGATIVE
Mycoplasma genitalium NAA: NEGATIVE
Mycoplasma hominis NAA: NEGATIVE
Neisseria gonorrhoeae, NAA: NEGATIVE
Ureaplasma spp NAA: POSITIVE — AB

## 2021-04-23 NOTE — Telephone Encounter (Signed)
Pt called again wanting his lab results. Pt says that he works for [employer] and he knows it doesn't usually take more than 2 days to get lab results back, so he doesn't understand what the issue is. ? ?Please call patient ASAP with results. ?

## 2021-04-23 NOTE — Telephone Encounter (Signed)
LM for pt - aware that results are not all back at this time - several in this panel are not back and several are preliminary results - aware per VM that once it is back Je will review and we will call him.  ? ?Also aware by VM that mychart will show results as well ?

## 2021-04-24 ENCOUNTER — Other Ambulatory Visit: Payer: Self-pay | Admitting: Nurse Practitioner

## 2021-04-24 DIAGNOSIS — R8290 Unspecified abnormal findings in urine: Secondary | ICD-10-CM

## 2021-04-24 MED ORDER — DOXYCYCLINE HYCLATE 100 MG PO TABS: 100.0000 mg | ORAL_TABLET | Freq: Two times a day (BID) | ORAL | 0 refills | Status: DC

## 2021-04-30 ENCOUNTER — Encounter: Payer: Self-pay | Admitting: Emergency Medicine

## 2021-05-06 ENCOUNTER — Other Ambulatory Visit: Payer: Self-pay

## 2021-06-11 ENCOUNTER — Encounter: Payer: Self-pay | Admitting: Physician Assistant

## 2021-06-11 ENCOUNTER — Ambulatory Visit (INDEPENDENT_AMBULATORY_CARE_PROVIDER_SITE_OTHER): Payer: No Typology Code available for payment source | Admitting: Physician Assistant

## 2021-06-11 DIAGNOSIS — S42293A Other displaced fracture of upper end of unspecified humerus, initial encounter for closed fracture: Secondary | ICD-10-CM | POA: Insufficient documentation

## 2021-06-11 DIAGNOSIS — S42291A Other displaced fracture of upper end of right humerus, initial encounter for closed fracture: Secondary | ICD-10-CM

## 2021-06-11 MED ORDER — MELOXICAM 15 MG PO TABS: 15.0000 mg | ORAL_TABLET | Freq: Every day | ORAL | 2 refills | Status: DC

## 2021-06-11 NOTE — Progress Notes (Signed)
Office Visit Note   Patient: Thomas Valdez           Date of Birth: 1995/03/11           MRN: [id number] Visit Date: 06/11/2021              Requested by: Daryll Drown, NP [address] [place],  [place] [address] PCP: Daryll Drown, NP  Chief Complaint  Patient presents with   Right Shoulder - Injury    DOI 06/09/2021      HPI: Patient is a pleasant 26 year old gentleman who presents with a chief complaint of right shoulder pain.  He is status post falling hard onto the back of his right shoulder.  Denies other injuries.  Denies any paresthesias.  He is right-handed and works as a Pharmacologist.  He was seen at [facility] where x-rays of his shoulder were obtained.  He denies any history of dislocation.  He does have a remote history of a clavicle fracture as a child.  Assessment & Plan: Visit Diagnoses:  1. Closed fracture of head of right humerus, initial encounter     Plan: Pleasant 26 year old gentleman who is status post fall onto right shoulder.  X-rays from [facility] were reviewed and demonstrate a nondisplaced fracture of the greater tuberosity.  Discussed this with him in great detail.  He is currently in a shoulder immobilizer and should remain so full-time.  He understands certainly he is not to do anything over his head as he may displace the fracture with contraction of the rotator cuff.  I will call him in some meloxicam in addition to the Percocet.  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  I have given him restrictions that he is to keep the immobilizer on.  He understands he should not be driving if he is taking narcotic pain medication I did tell him if he needed a refill of the Percocet I be glad to do this for him when it is needed.  He should have new shoulder x-rays at his next visit  Follow-Up Instructions: Return in about 2 weeks (around 06/25/2021).   Ortho Exam  Patient is alert, oriented, no  adenopathy, well-dressed, normal affect, normal respiratory effort. Examination right arm is immobilized.  He has good distal radial pulses good grip strength.  He is tender over the greater tuberosity of the humeral head.  No significant ecchymosis no redness no paresthesias  Imaging: No results found. No images are attached to the encounter.  Labs: Lab Results  Component Value Date   REPTSTATUS 07/13/2020 FINAL 07/11/2020   CULT 20,000 COLONIES/mL VIRIDANS STREPTOCOCCUS (A) 07/11/2020     Lab Results  Component Value Date   ALBUMIN 4.9 01/21/2021   ALBUMIN 4.2 07/14/2020   ALBUMIN 4.8 08/17/2018    No results found for: MG No results found for: VD25OH  No results found for: PREALBUMIN    Latest Ref Rng & Units 01/21/2021    2:52 PM 07/14/2020    8:38 PM 08/17/2018    8:19 PM  CBC EXTENDED  WBC 3.4 - 10.8 x10E3/uL 3.7   7.1   7.9    RBC 4.14 - 5.80 x10E6/uL 5.30   4.63   4.96    Hemoglobin 13.0 - 17.7 g/dL 53.6   14.4   31.5    HCT 37.5 - 51.0 % 46.7   43.2   46.0    Platelets 150 - 450  x10E3/uL 340   293   343    NEUT# 1.4 - 7.0 x10E3/uL 1.9    5.7    Lymph# 0.7 - 3.1 x10E3/uL 1.5    1.6       There is no height or weight on file to calculate BMI.  Orders:  No orders of the defined types were placed in this encounter.  No orders of the defined types were placed in this encounter.    Procedures: No procedures performed  Clinical Data: No additional findings.  ROS:  All other systems negative, except as noted in the HPI. Review of Systems  Objective: Vital Signs: There were no vitals taken for this visit.  Specialty Comments:  No specialty comments available.  PMFS History: Patient Active Problem List   Diagnosis Date Noted   Humeral head fracture 06/11/2021   Establishing care with new doctor, encounter for 01/21/2021   Annual physical exam 01/21/2021   History reviewed. No pertinent past medical history.  Family History  Problem Relation Age of  Onset   Cancer Father        prostate   Heart disease Maternal Grandmother     History reviewed. No pertinent surgical history. Social History   Occupational History   Not on file  Tobacco Use   Smoking status: Never   Smokeless tobacco: Never  Vaping Use   Vaping Use: Never used  Substance and Sexual Activity   Alcohol use: Yes    Comment: social   Drug use: No   Sexual activity: Not on file

## 2021-06-12 ENCOUNTER — Telehealth: Payer: Self-pay | Admitting: Physician Assistant

## 2021-06-12 ENCOUNTER — Ambulatory Visit: Payer: BC Managed Care – PPO | Admitting: Orthopedic Surgery

## 2021-06-12 NOTE — Telephone Encounter (Signed)
Patient called. He would like a refill on oxycodone. He will use his last 6 today. His call back number is [phone]

## 2021-06-12 NOTE — Telephone Encounter (Signed)
Pt called requesting refills of meloxicam and oxycodone be sent to [facility] in [place]. Pt states he went to pick up meloxicam and pharmacy was closed for maintenance. Please send both refills to [facility] in [place]. Pt phone number is [phone].

## 2021-06-13 ENCOUNTER — Other Ambulatory Visit: Payer: Self-pay | Admitting: Surgical

## 2021-06-13 MED ORDER — OXYCODONE-ACETAMINOPHEN 5-325 MG PO TABS: 1.0000 | ORAL_TABLET | Freq: Four times a day (QID) | ORAL | 0 refills | Status: DC | PRN

## 2021-06-13 NOTE — Telephone Encounter (Signed)
Sent in RX for oxy q6h prn

## 2021-06-15 ENCOUNTER — Other Ambulatory Visit: Payer: Self-pay

## 2021-06-15 ENCOUNTER — Other Ambulatory Visit: Payer: Self-pay | Admitting: Physician Assistant

## 2021-06-15 NOTE — Telephone Encounter (Signed)
Attempted to contact patient however had to leave a voicemail. 

## 2021-06-24 ENCOUNTER — Other Ambulatory Visit: Payer: Self-pay | Admitting: Physician Assistant

## 2021-06-24 ENCOUNTER — Ambulatory Visit (INDEPENDENT_AMBULATORY_CARE_PROVIDER_SITE_OTHER): Payer: No Typology Code available for payment source

## 2021-06-24 ENCOUNTER — Encounter: Payer: Self-pay | Admitting: Orthopaedic Surgery

## 2021-06-24 ENCOUNTER — Ambulatory Visit (INDEPENDENT_AMBULATORY_CARE_PROVIDER_SITE_OTHER): Payer: No Typology Code available for payment source | Admitting: Orthopaedic Surgery

## 2021-06-24 ENCOUNTER — Other Ambulatory Visit: Payer: Self-pay

## 2021-06-24 DIAGNOSIS — S42291A Other displaced fracture of upper end of right humerus, initial encounter for closed fracture: Secondary | ICD-10-CM

## 2021-06-24 MED ORDER — OXYCODONE-ACETAMINOPHEN 5-325 MG PO TABS
1.0000 | ORAL_TABLET | Freq: Four times a day (QID) | ORAL | 0 refills | Status: DC | PRN
  Filled 2021-06-24: qty 30, 8d supply, fill #0

## 2021-06-24 NOTE — Progress Notes (Signed)
Office Visit Note   Patient: Thomas Valdez           Date of Birth: 28-May-1995           MRN: [id number] Visit Date: 06/24/2021              Requested by: Daryll Drown, NP [address] [place],  [place] [address] PCP: Daryll Drown, NP  Chief Complaint  Patient presents with   Right Shoulder - Follow-up    Had a fall 06/09/21      HPI: Patient is a pleasant 26 year old gentleman who is now 2 weeks status post fall onto his right shoulder.  He sustained a minimally displaced greater tuberosity fracture of the right humerus.  He has been immobilized in a sling and immobilizer.  He has not done any overhead motion as was recommended.  He has been using his hand for his job and for activities of daily living but is kept by his side.  Assessment & Plan: Visit Diagnoses:  1. Closed fracture of head of right humerus, initial encounter     Plan: 2 weeks status post above very return tuberosity fracture of the right humeral head.  Patient was advised to continue with immobilization he can come out of his sling and do gentle pendulum exercises he is not to do any active motion or motion above his head.  He will follow-up again in 2 weeks repeat x-rays that should be taken of his right shoulder.  Films today did not demonstrate any change in position of the fracture and essentially nondisplaced  Follow-Up Instructions: No follow-ups on file.   Ortho Exam  Patient is alert, oriented, no adenopathy, well-dressed, normal affect, normal respiratory effort. Examination he has strong grip distally swelling in his hand has decreased since previous exam.  He has good range of motion of his elbow still tender to palpation over the humeral head  Imaging: XR Shoulder Right  Result Date: 06/24/2021 Radiographs of the right shoulder were obtained today.  They were also compared to radiographs of 2 weeks ago.  He has a minimally displaced greater tuberosity fracture of the humeral  head.  No further displacement from previous x-rays no other bony abnormalities  No images are attached to the encounter.  Labs: Lab Results  Component Value Date   REPTSTATUS 07/13/2020 FINAL 07/11/2020   CULT 20,000 COLONIES/mL VIRIDANS STREPTOCOCCUS (A) 07/11/2020     Lab Results  Component Value Date   ALBUMIN 4.9 01/21/2021   ALBUMIN 4.2 07/14/2020   ALBUMIN 4.8 08/17/2018    No results found for: "MG" No results found for: "VD25OH"  No results found for: "PREALBUMIN"    Latest Ref Rng & Units 01/21/2021    2:52 PM 07/14/2020    8:38 PM 08/17/2018    8:19 PM  CBC EXTENDED  WBC 3.4 - 10.8 x10E3/uL 3.7  7.1  7.9   RBC 4.14 - 5.80 x10E6/uL 5.30  4.63  4.96   Hemoglobin 13.0 - 17.7 g/dL 51.8  84.1  66.0   HCT 37.5 - 51.0 % 46.7  43.2  46.0   Platelets 150 - 450 x10E3/uL 340  293  343   NEUT# 1.4 - 7.0 x10E3/uL 1.9   5.7   Lymph# 0.7 - 3.1 x10E3/uL 1.5   1.6      There is no height or weight on file to calculate BMI.  Orders:  Orders Placed This Encounter  Procedures   DG Shoulder Right  XR Shoulder Right   No orders of the defined types were placed in this encounter.    Procedures: No procedures performed  Clinical Data: No additional findings.  ROS:  All other systems negative, except as noted in the HPI. Review of Systems  Objective: Vital Signs: There were no vitals taken for this visit.  Specialty Comments:  No specialty comments available.  PMFS History: Patient Active Problem List   Diagnosis Date Noted   Humeral head fracture 06/11/2021   Establishing care with new doctor, encounter for 01/21/2021   Annual physical exam 01/21/2021   History reviewed. No pertinent past medical history.  Family History  Problem Relation Age of Onset   Cancer Father        prostate   Heart disease Maternal Grandmother     History reviewed. No pertinent surgical history. Social History   Occupational History   Not on file  Tobacco Use   Smoking  status: Never   Smokeless tobacco: Never  Vaping Use   Vaping Use: Never used  Substance and Sexual Activity   Alcohol use: Yes    Comment: social   Drug use: No   Sexual activity: Not on file

## 2021-07-08 ENCOUNTER — Other Ambulatory Visit: Payer: Self-pay | Admitting: Physician Assistant

## 2021-07-08 ENCOUNTER — Ambulatory Visit (INDEPENDENT_AMBULATORY_CARE_PROVIDER_SITE_OTHER): Payer: No Typology Code available for payment source | Admitting: Orthopaedic Surgery

## 2021-07-08 ENCOUNTER — Other Ambulatory Visit: Payer: Self-pay

## 2021-07-08 ENCOUNTER — Encounter: Payer: Self-pay | Admitting: Orthopaedic Surgery

## 2021-07-08 ENCOUNTER — Ambulatory Visit (INDEPENDENT_AMBULATORY_CARE_PROVIDER_SITE_OTHER): Payer: No Typology Code available for payment source

## 2021-07-08 VITALS — Ht 70.5 in | Wt 186.1 lb

## 2021-07-08 DIAGNOSIS — S42291A Other displaced fracture of upper end of right humerus, initial encounter for closed fracture: Secondary | ICD-10-CM

## 2021-07-08 MED ORDER — OXYCODONE-ACETAMINOPHEN 5-325 MG PO TABS
1.0000 | ORAL_TABLET | Freq: Four times a day (QID) | ORAL | 0 refills | Status: DC | PRN
  Filled 2021-07-08: qty 4, 1d supply, fill #0
  Filled 2021-07-08: qty 26, 7d supply, fill #0

## 2021-07-08 NOTE — Progress Notes (Signed)
Office Visit Note   Patient: Thomas Valdez           Date of Birth: Jan 31, 1995           MRN: [id number] Visit Date: 07/08/2021              Requested by: Daryll Drown, NP [address] [place],  [place] [address] PCP: Daryll Drown, NP   Assessment & Plan: Visit Diagnoses:  1. Closed fracture of head of right humerus, initial encounter     Plan: 1 month status post injury to his right shoulder.  Initial x-rays demonstrated nondisplaced fracture of the greater tuberosity.  X-rays today reveal no change in position.  No callus is yet.  Slowly wean from the sling and begin overhead range of motion.  Limit activities at work as outlined and return to the office for reevaluation in 2 weeks.  Should not need further x-rays.  Might need physical therapy depending upon his range of motion on return  Follow-Up Instructions: Return in about 2 weeks (around 07/22/2021).   Orders:  Orders Placed This Encounter  Procedures   XR Shoulder Right   No orders of the defined types were placed in this encounter.     Procedures: No procedures performed   Clinical Data: No additional findings.   Subjective: Chief Complaint  Patient presents with   Right Shoulder - Injury, Pain, Fracture    Shoulder is still painful at times, but feels like it is getting better. Needs refill on pain medication.  1 month post injury to right shoulder with x-rays demonstrating essentially nondisplaced fracture of the greater tuberosity.  Has been wearing the sling and having minimal discomfort.  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  Some discomfort over the greater tuberosity.  Skin intact.  Neurologically intact  Specialty  Comments:  No specialty comments available.  Imaging: XR Shoulder Right  Result Date: 07/08/2021 Films of the right shoulder obtained in 2 projections.  There is been no change in the fracture of the greater tuberosity.  There is about 2 mm displacement.  No callus is yet    PMFS History: Patient Active Problem List   Diagnosis Date Noted   Humeral head fracture 06/11/2021   Establishing care with new doctor, encounter for 01/21/2021   Annual physical exam 01/21/2021   History reviewed. No pertinent past medical history.  Family History  Problem Relation Age of Onset   Cancer Father        prostate   Heart disease Maternal Grandmother     History reviewed. No pertinent surgical history. Social History   Occupational History   Not on file  Tobacco Use   Smoking status: Never   Smokeless tobacco: Never  Vaping Use   Vaping Use: Never used  Substance and Sexual Activity   Alcohol use: Yes    Comment: social   Drug use: No   Sexual activity: Not on file     Valeria Batman, MD   Note - This record has been created using [employer].  Chart creation errors have been sought, but may not always  have been located. Such  creation errors do not reflect on  the standard of medical care.

## 2021-07-23 ENCOUNTER — Other Ambulatory Visit: Payer: Self-pay

## 2021-07-23 ENCOUNTER — Ambulatory Visit (INDEPENDENT_AMBULATORY_CARE_PROVIDER_SITE_OTHER): Payer: No Typology Code available for payment source | Admitting: Orthopaedic Surgery

## 2021-07-23 DIAGNOSIS — S42291A Other displaced fracture of upper end of right humerus, initial encounter for closed fracture: Secondary | ICD-10-CM

## 2021-07-23 MED ORDER — IBUPROFEN 800 MG PO TABS
800.0000 mg | ORAL_TABLET | Freq: Three times a day (TID) | ORAL | 1 refills | Status: DC | PRN
  Filled 2021-07-23: qty 60, 20d supply, fill #0

## 2021-07-23 NOTE — Progress Notes (Signed)
Office Visit Note   Patient: Thomas Valdez           Date of Birth: 02/11/1995           MRN: [id number] Visit Date: 07/23/2021              Requested by: Daryll Drown, NP [address] [place],  [place] [address] PCP: Daryll Drown, NP   Assessment & Plan: Visit Diagnoses:  1. Closed fracture of head of right humerus, initial encounter     Plan: Gradual resumption of activities as his symptoms improved.  He will delay any weight lifting for least a month.  He can return if he is having ongoing problems.  Follow-Up Instructions: Return if symptoms worsen or fail to improve.   Orders:  No orders of the defined types were placed in this encounter.  Meds ordered this encounter  Medications   ibuprofen (ADVIL) 800 MG tablet    Sig: Take 1 tablet (800 mg total) by mouth every 8 (eight) hours as needed.    Dispense:  60 tablet    Refill:  1      Procedures: No procedures performed   Clinical Data: No additional findings.   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  Injury was in May he takes the sling off at times.  Review of Systems all the systems noncontributory.   Objective: Vital Signs: There were no vitals taken for this visit.  Physical Exam Constitutional:      Appearance: He is well-developed.  HENT:     Head: Normocephalic and atraumatic.     Right Ear: External ear normal.     Left Ear: External ear normal.  Eyes:     Pupils: Pupils are equal, round, and reactive to light.  Neck:     Thyroid: No thyromegaly.     Trachea: No tracheal deviation.  Cardiovascular:     Rate and Rhythm: Normal rate.  Pulmonary:     Effort: Pulmonary effort is normal.     Breath sounds: No wheezing.  Abdominal:     General: Bowel sounds are normal.     Palpations:  Abdomen is soft.  Musculoskeletal:     Cervical back: Neck supple.  Skin:    General: Skin is warm and dry.     Capillary Refill: Capillary refill takes less than 2 seconds.  Neurological:     Mental Status: He is alert and oriented to person, place, and time.  Psychiatric:        Behavior: Behavior normal.        Thought Content: Thought content normal.        Judgment: Judgment normal.     Ortho Exam no swelling of the hand.  Internal/external rotation has fairly good strength.  Still has mild discomfort with abduction of the shoulder.  Good cervical range of motion.  Specialty Comments:  No specialty comments available.  Imaging: No results found.   PMFS History: Patient Active Problem List   Diagnosis Date Noted   Humeral head fracture 06/11/2021   Establishing care with new doctor, encounter for 01/21/2021   Annual physical exam 01/21/2021   No past medical history on file.  Family History  Problem Relation Age of Onset   Cancer Father        prostate  Heart disease Maternal Grandmother     No past surgical history on file. Social History   Occupational History   Not on file  Tobacco Use   Smoking status: Never   Smokeless tobacco: Never  Vaping Use   Vaping Use: Never used  Substance and Sexual Activity   Alcohol use: Yes    Comment: social   Drug use: No   Sexual activity: Not on file

## 2021-08-05 ENCOUNTER — Emergency Department (HOSPITAL_COMMUNITY): Payer: No Typology Code available for payment source

## 2021-08-05 ENCOUNTER — Encounter (HOSPITAL_COMMUNITY): Payer: Self-pay | Admitting: *Deleted

## 2021-08-05 ENCOUNTER — Emergency Department (HOSPITAL_COMMUNITY)
Admission: EM | Admit: 2021-08-05 | Discharge: 2021-08-06 | Disposition: A | Discharge status: Home / Self Care | Payer: No Typology Code available for payment source | Attending: Emergency Medicine | Admitting: Emergency Medicine

## 2021-08-05 DIAGNOSIS — K5732 Diverticulitis of large intestine without perforation or abscess without bleeding: Secondary | ICD-10-CM | POA: Insufficient documentation

## 2021-08-05 DIAGNOSIS — R1031 Right lower quadrant pain: Secondary | ICD-10-CM | POA: Diagnosis present

## 2021-08-05 DIAGNOSIS — K5792 Diverticulitis of intestine, part unspecified, without perforation or abscess without bleeding: Secondary | ICD-10-CM

## 2021-08-05 LAB — URINALYSIS, ROUTINE W REFLEX MICROSCOPIC
Bilirubin Urine: NEGATIVE
Glucose, UA: NEGATIVE mg/dL
Hgb urine dipstick: NEGATIVE
Ketones, ur: 80 mg/dL — AB
Leukocytes,Ua: NEGATIVE
Nitrite: NEGATIVE
Protein, ur: NEGATIVE mg/dL
Specific Gravity, Urine: 1.02 (ref 1.005–1.030)
pH: 6 (ref 5.0–8.0)

## 2021-08-05 LAB — CBC
HCT: 44.9 % (ref 39.0–52.0)
Hemoglobin: 15.1 g/dL (ref 13.0–17.0)
MCH: 30.8 pg (ref 26.0–34.0)
MCHC: 33.6 g/dL (ref 30.0–36.0)
MCV: 91.4 fL (ref 80.0–100.0)
Platelets: 291 10*3/uL (ref 150–400)
RBC: 4.91 MIL/uL (ref 4.22–5.81)
RDW: 12 % (ref 11.5–15.5)
WBC: 9.7 10*3/uL (ref 4.0–10.5)
nRBC: 0 % (ref 0.0–0.2)

## 2021-08-05 LAB — COMPREHENSIVE METABOLIC PANEL
ALT: 19 U/L (ref 0–44)
AST: 18 U/L (ref 15–41)
Albumin: 4.7 g/dL (ref 3.5–5.0)
Alkaline Phosphatase: 78 U/L (ref 38–126)
Anion gap: 9 (ref 5–15)
BUN: 17 mg/dL (ref 6–20)
CO2: 26 mmol/L (ref 22–32)
Calcium: 9.5 mg/dL (ref 8.9–10.3)
Chloride: 101 mmol/L (ref 98–111)
Creatinine, Ser: 1 mg/dL (ref 0.61–1.24)
GFR, Estimated: >60 mL/min (ref >60)
Glucose, Bld: 92 mg/dL (ref 70–99)
Potassium: 3.6 mmol/L (ref 3.5–5.1)
Sodium: 136 mmol/L (ref 135–145)
Total Bilirubin: 0.9 mg/dL (ref 0.3–1.2)
Total Protein: 8.6 g/dL — ABNORMAL HIGH (ref 6.5–8.1)

## 2021-08-05 LAB — LIPASE, BLOOD: Lipase: 27 U/L (ref 11–51)

## 2021-08-05 MED ORDER — FENTANYL CITRATE (PF) 100 MCG/2ML IJ SOLN
100.0000 ug | Freq: Once | INTRAMUSCULAR | Status: AC
  Administered 2021-08-05: 100 ug via INTRAVENOUS
  Filled 2021-08-05: qty 2

## 2021-08-05 MED ORDER — ONDANSETRON HCL 4 MG/2ML IJ SOLN
4.0000 mg | Freq: Once | INTRAMUSCULAR | Status: AC
  Administered 2021-08-05: 4 mg via INTRAVENOUS
  Filled 2021-08-05: qty 2

## 2021-08-05 MED ORDER — SODIUM CHLORIDE 0.9 % IV BOLUS
1000.0000 mL | Freq: Once | INTRAVENOUS | Status: AC
  Administered 2021-08-05: 1000 mL via INTRAVENOUS

## 2021-08-05 NOTE — ED Triage Notes (Signed)
Pt in c/o bil lower abd pain onset x 2 days ago, LBM x 3 days, with dark colored stools with hx of the same thing in Feb/March, pt denies n/d, c/o nausea, A&O x4

## 2021-08-06 MED ORDER — AMOXICILLIN-POT CLAVULANATE 875-125 MG PO TABS
1.0000 | ORAL_TABLET | Freq: Once | ORAL | Status: AC
  Administered 2021-08-06: 1 via ORAL
  Filled 2021-08-06: qty 1

## 2021-08-06 MED ORDER — AMOXICILLIN-POT CLAVULANATE 875-125 MG PO TABS: 1.0000 | ORAL_TABLET | Freq: Two times a day (BID) | ORAL | 0 refills | Status: DC

## 2021-08-06 MED ORDER — FENTANYL CITRATE PF 50 MCG/ML IJ SOSY
50.0000 ug | PREFILLED_SYRINGE | Freq: Once | INTRAMUSCULAR | Status: AC
  Administered 2021-08-06: 50 ug via INTRAVENOUS
  Filled 2021-08-06: qty 1

## 2021-08-06 MED ORDER — HYDROCODONE-ACETAMINOPHEN 5-325 MG PO TABS
1.0000 | ORAL_TABLET | Freq: Once | ORAL | Status: AC
  Administered 2021-08-06: 1 via ORAL
  Filled 2021-08-06: qty 1

## 2021-08-06 MED ORDER — HYDROCODONE-ACETAMINOPHEN 5-325 MG PO TABS: 1.0000 | ORAL_TABLET | Freq: Four times a day (QID) | ORAL | 0 refills | Status: DC | PRN

## 2021-08-06 MED ORDER — IOHEXOL 300 MG/ML  SOLN
100.0000 mL | Freq: Once | INTRAMUSCULAR | Status: AC | PRN
  Administered 2021-08-06: 100 mL via INTRAVENOUS

## 2021-08-06 NOTE — ED Provider Notes (Signed)
[facility] EMERGENCY DEPARTMENT Provider Note   CSN: [id number] Arrival date & time: 08/05/21  1717     History  Chief Complaint  Patient presents with   Abdominal Pain    Thomas Valdez is a 26 y.o. male.  The history is provided by the patient.  Abdominal Pain Pain location:  RLQ and LLQ Pain quality: aching   Pain severity:  Severe Onset quality:  Gradual Duration:  2 days Timing:  Intermittent Progression:  Worsening Chronicity:  Recurrent Worsened by:  Movement and palpation Associated symptoms: diarrhea and nausea   Associated symptoms: no dysuria, no hematochezia, no melena and no vomiting   Risk factors: has not had multiple surgeries and no NSAID use    He reports abdominal pain for the past 2 days.  Reports his stool was loose and dark but no blood and no black stool No vomiting.  No previous abdominal surgeries.    Home Medications Prior to Admission medications   Medication Sig Start Date End Date Taking? Authorizing Provider  ibuprofen (ADVIL) 800 MG tablet Take 1 tablet (800 mg total) by mouth every 8 (eight) hours as needed. 07/23/21  Yes Eldred Manges, MD  traZODone (DESYREL) 50 MG tablet Take 1 tablet by mouth daily 10/08/20  Yes Shawnie Dapper, PA-C  valACYclovir (VALTREX) 1000 MG tablet Take 1 tablet by mouth twice a day 12/11/20  Yes Shawnie Dapper, PA-C  doxycycline (VIBRA-TABS) 100 MG tablet Take 1 tablet (100 mg total) by mouth 2 (two) times daily. Patient not taking: Reported on 08/05/2021 04/24/21   Daryll Drown, NP  meloxicam (MOBIC) 15 MG tablet Take 1 tablet (15 mg total) by mouth daily. Patient not taking: Reported on 08/05/2021 06/11/21 06/11/22  Persons, [place], [place]  oxyCODONE-acetaminophen (PERCOCET) 5-325 MG tablet Take 1 tablet by mouth every 6 (six) hours as needed for severe pain. Patient not taking: Reported on 08/05/2021 07/08/21 07/08/22  Persons, [place], [place]  traZODone (DESYREL) 50 MG tablet Take 50 mg by mouth at bedtime  as needed for sleep.    [provider]      Allergies    Patient has no known allergies.    Review of Systems   Review of Systems  Gastrointestinal:  Positive for abdominal pain, diarrhea and nausea. Negative for hematochezia, melena and vomiting.  Genitourinary:  Negative for dysuria.    Physical Exam Updated Vital Signs BP 120/75   Pulse 81   Temp 100 F (37.8 C) (Oral)   Resp 14   SpO2 99%  Physical Exam CONSTITUTIONAL: Well developed/well nourished HEAD: Normocephalic/atraumatic EYES: EOMI/PERRL, no icterus ENMT: Mucous membranes moist NECK: supple no meningeal signs SPINE/BACK:entire spine nontender CV: S1/S2 noted, no murmurs/rubs/gallops noted LUNGS: Lungs are clear to auscultation bilaterally, no apparent distress ABDOMEN: soft, moderate RLQ and LLQ tenderness, no rebound or guarding, bowel sounds noted throughout abdomen GU:no cva tenderness NEURO: Pt is awake/alert/appropriate, moves all extremitiesx4.  No facial droop.   EXTREMITIES: pulses normal/equal, full ROM SKIN: warm, color normal PSYCH: no abnormalities of mood noted, alert and oriented to situation  ED Results / Procedures / Treatments   Labs (all labs ordered are listed, but only abnormal results are displayed) Labs Reviewed  COMPREHENSIVE METABOLIC PANEL - Abnormal; Notable for the following components:      Result Value   Total Protein 8.6 (*)    All other components within normal limits  URINALYSIS, ROUTINE W REFLEX MICROSCOPIC - Abnormal; Notable for the following components:  Ketones, ur 80 (*)    All other components within normal limits  LIPASE, BLOOD  CBC  POC OCCULT BLOOD, ED    EKG None  Radiology CT ABDOMEN PELVIS W CONTRAST  Result Date: 08/06/2021 CLINICAL DATA:  Abdominal pain EXAM: CT ABDOMEN AND PELVIS WITH CONTRAST TECHNIQUE: Multidetector CT imaging of the abdomen and pelvis was performed using the standard protocol following bolus administration of  intravenous contrast. RADIATION DOSE REDUCTION: This exam was performed according to the departmental dose-optimization program which includes automated exposure control, adjustment of the mA and/or kV according to patient size and/or use of iterative reconstruction technique. CONTRAST:  OMNIPAQUE IOHEXOL 300 MG/ML  SOLN COMPARISON:  07/15/2020 FINDINGS: Lower chest: Lung bases are clear. Hepatobiliary: Liver is within normal limits. Gallbladder is unremarkable. No intrahepatic or extrahepatic ductal dilatation. Pancreas: Within normal limits. Spleen: Within normal limits. Adrenals/Urinary Tract: Adrenal glands are within normal limits. Kidneys are within normal limits.  No hydronephrosis. Bladder is within normal limits. Stomach/Bowel: Stomach is within normal limits. No evidence of bowel obstruction. Normal appendix (series 2/image 49). Sigmoid diverticulitis with pericolonic inflammatory changes/stranding in the left lower quadrant (series 2/image 86), reflecting acute diverticulitis. No drainable fluid collection/abscess. No free air to suggest macroscopic perforation. Vascular/Lymphatic: No evidence of abdominal aortic aneurysm. No suspicious abdominopelvic lymphadenopathy. Reproductive: Prostate is unremarkable. Other: Small volume pelvic ascites. Musculoskeletal: Visualized osseous structures are within normal limits. IMPRESSION: Acute sigmoid diverticulitis. No drainable fluid collection/abscess. No free air to suggest macroscopic perforation. Electronically Signed   By: Charline Bills M.D.   On: 08/06/2021 03:42    Procedures Procedures    Medications Ordered in ED Medications  HYDROcodone-acetaminophen (NORCO/VICODIN) 5-325 MG per tablet 1 tablet (has no administration in time range)  amoxicillin-clavulanate (AUGMENTIN) 875-125 MG per tablet 1 tablet (has no administration in time range)  sodium chloride 0.9 % bolus 1,000 mL (0 mLs Intravenous Stopped 08/06/21 0224)  ondansetron (ZOFRAN)  injection 4 mg (4 mg Intravenous Given 08/05/21 2354)  fentaNYL (SUBLIMAZE) injection 100 mcg (100 mcg Intravenous Given 08/05/21 2353)  iohexol (OMNIPAQUE) 300 MG/ML solution 100 mL (100 mLs Intravenous Contrast Given 08/06/21 0322)  fentaNYL (SUBLIMAZE) injection 50 mcg (50 mcg Intravenous Given 08/06/21 0224)    ED Course/ Medical Decision Making/ A&P Clinical Course as of 08/06/21 0428  Thu Aug 06, 2021  0146 Patient felt improved after IV pain medicine.  He is awaiting CT imaging. [DW]  0422 Patient overall is improved. [DW]  8466 Ct imaging reveals diverticulitis.  Pt has had this previously.  Will refer to GI as he will likely need colonoscopy.  Pt denies known h/o IBD [DW]  [id number] Patient without signs of perforation or other complicating features.  He is safe for outpatient management.  He tolerated Augmentin last year.  Will give short course of pain medicine.  We discussed return precautions [DW]    Clinical Course User Index [DW] Zadie Rhine, MD                           Medical Decision Making Amount and/or Complexity of Data Reviewed Labs: ordered. Radiology: ordered.  Risk Prescription drug management.   This patient presents to the ED for concern of abdominal pain, this involves an extensive number of treatment options, and is a complaint that carries with it a high risk of complications and morbidity.  The differential diagnosis includes but is not limited to cholecystitis, cholelithiasis, pancreatitis, gastritis, peptic ulcer disease, appendicitis,  bowel obstruction, bowel perforation, diverticulitis, AAA, ischemic bowel   Additional history obtained: Records reviewed  outpatient records reviewed  Lab Tests: I Ordered, and personally interpreted labs.  The pertinent results include: Labs reveal dehydration  Imaging Studies ordered: I ordered imaging studies including CT scan abdomen pelvis   I independently visualized and interpreted imaging which showed  diverticulitis I agree with the radiologist interpretation  Medicines ordered and prescription drug management: I ordered medication including IV fentanyl for pain Reevaluation of the patient after these medicines showed that the patient    improved   Critical Interventions:  Antibiotics   After the interventions noted above, I reevaluated the patient and found that they have :improved  Complexity of problems addressed: Patient's presentation is most consistent with  acute presentation with potential threat to life or bodily function  Disposition: After consideration of the diagnostic results and the patient's response to treatment,  I feel that the patent would benefit from discharge   .           Final Clinical Impression(s) / ED Diagnoses Final diagnoses:  Diverticulitis    Rx / DC Orders ED Discharge Orders          Ordered    HYDROcodone-acetaminophen (NORCO/VICODIN) 5-325 MG tablet  Every 6 hours PRN        08/06/21 0419    amoxicillin-clavulanate (AUGMENTIN) 875-125 MG tablet  Every 12 hours        08/06/21 0419              Zadie Rhine, MD 08/06/21 [id number]

## 2021-08-10 ENCOUNTER — Inpatient Hospital Stay (HOSPITAL_COMMUNITY)
Admission: EM | Admit: 2021-08-10 | Discharge: 2021-08-13 | DRG: 391 | Disposition: A | Discharge status: Home / Self Care | Payer: No Typology Code available for payment source | Attending: Student | Admitting: Student

## 2021-08-10 ENCOUNTER — Encounter (HOSPITAL_COMMUNITY): Payer: Self-pay | Admitting: Emergency Medicine

## 2021-08-10 ENCOUNTER — Emergency Department (HOSPITAL_COMMUNITY): Payer: No Typology Code available for payment source

## 2021-08-10 ENCOUNTER — Other Ambulatory Visit: Payer: Self-pay

## 2021-08-10 DIAGNOSIS — K5792 Diverticulitis of intestine, part unspecified, without perforation or abscess without bleeding: Principal | ICD-10-CM

## 2021-08-10 DIAGNOSIS — Z79899 Other long term (current) drug therapy: Secondary | ICD-10-CM

## 2021-08-10 DIAGNOSIS — K651 Peritoneal abscess: Secondary | ICD-10-CM | POA: Diagnosis present

## 2021-08-10 DIAGNOSIS — N433 Hydrocele, unspecified: Secondary | ICD-10-CM | POA: Diagnosis present

## 2021-08-10 DIAGNOSIS — K5720 Diverticulitis of large intestine with perforation and abscess without bleeding: Principal | ICD-10-CM | POA: Diagnosis present

## 2021-08-10 HISTORY — DX: Diverticulitis of intestine, part unspecified, without perforation or abscess without bleeding: K57.92

## 2021-08-10 LAB — COMPREHENSIVE METABOLIC PANEL
ALT: 18 U/L (ref 0–44)
AST: 18 U/L (ref 15–41)
Albumin: 4.5 g/dL (ref 3.5–5.0)
Alkaline Phosphatase: 75 U/L (ref 38–126)
Anion gap: 10 (ref 5–15)
BUN: 14 mg/dL (ref 6–20)
CO2: 25 mmol/L (ref 22–32)
Calcium: 9.5 mg/dL (ref 8.9–10.3)
Chloride: 103 mmol/L (ref 98–111)
Creatinine, Ser: 1 mg/dL (ref 0.61–1.24)
GFR, Estimated: >60 mL/min (ref >60)
Glucose, Bld: 96 mg/dL (ref 70–99)
Potassium: 3.9 mmol/L (ref 3.5–5.1)
Sodium: 138 mmol/L (ref 135–145)
Total Bilirubin: 0.8 mg/dL (ref 0.3–1.2)
Total Protein: 9 g/dL — ABNORMAL HIGH (ref 6.5–8.1)

## 2021-08-10 LAB — URINALYSIS, ROUTINE W REFLEX MICROSCOPIC
Bilirubin Urine: NEGATIVE
Glucose, UA: NEGATIVE mg/dL
Hgb urine dipstick: NEGATIVE
Ketones, ur: 80 mg/dL — AB
Leukocytes,Ua: NEGATIVE
Nitrite: NEGATIVE
Protein, ur: NEGATIVE mg/dL
Specific Gravity, Urine: 1.027 (ref 1.005–1.030)
pH: 6 (ref 5.0–8.0)

## 2021-08-10 LAB — CBC
HCT: 45 % (ref 39.0–52.0)
Hemoglobin: 15.3 g/dL (ref 13.0–17.0)
MCH: 31 pg (ref 26.0–34.0)
MCHC: 34 g/dL (ref 30.0–36.0)
MCV: 91.3 fL (ref 80.0–100.0)
Platelets: 266 10*3/uL (ref 150–400)
RBC: 4.93 MIL/uL (ref 4.22–5.81)
RDW: 11.9 % (ref 11.5–15.5)
WBC: 8.7 10*3/uL (ref 4.0–10.5)
nRBC: 0 % (ref 0.0–0.2)

## 2021-08-10 LAB — LIPASE, BLOOD: Lipase: 25 U/L (ref 11–51)

## 2021-08-10 MED ORDER — ONDANSETRON HCL 4 MG/2ML IJ SOLN
4.0000 mg | Freq: Four times a day (QID) | INTRAMUSCULAR | Status: DC | PRN
  Administered 2021-08-12: 4 mg via INTRAVENOUS
  Filled 2021-08-10: qty 2

## 2021-08-10 MED ORDER — HYDROMORPHONE HCL 1 MG/ML IJ SOLN
0.5000 mg | INTRAMUSCULAR | Status: DC | PRN
  Administered 2021-08-10 – 2021-08-12 (×11): 0.5 mg via INTRAVENOUS
  Filled 2021-08-10 (×11): qty 0.5

## 2021-08-10 MED ORDER — LACTATED RINGERS IV SOLN: INTRAVENOUS | Status: DC

## 2021-08-10 MED ORDER — ONDANSETRON HCL 4 MG PO TABS: 4.0000 mg | ORAL_TABLET | Freq: Four times a day (QID) | ORAL | Status: DC | PRN

## 2021-08-10 MED ORDER — LACTATED RINGERS IV BOLUS
2000.0000 mL | Freq: Once | INTRAVENOUS | Status: AC
  Administered 2021-08-10: 2000 mL via INTRAVENOUS

## 2021-08-10 MED ORDER — ONDANSETRON 4 MG PO TBDP
4.0000 mg | ORAL_TABLET | Freq: Once | ORAL | Status: AC | PRN
  Administered 2021-08-10: 4 mg via ORAL
  Filled 2021-08-10: qty 1

## 2021-08-10 MED ORDER — PIPERACILLIN-TAZOBACTAM 3.375 G IVPB 30 MIN
3.3750 g | Freq: Once | INTRAVENOUS | Status: AC
  Administered 2021-08-10: 3.375 g via INTRAVENOUS
  Filled 2021-08-10: qty 50

## 2021-08-10 MED ORDER — KETOROLAC TROMETHAMINE 15 MG/ML IJ SOLN
15.0000 mg | Freq: Once | INTRAMUSCULAR | Status: AC
  Administered 2021-08-10: 15 mg via INTRAVENOUS
  Filled 2021-08-10: qty 1

## 2021-08-10 MED ORDER — HYDROMORPHONE HCL 1 MG/ML IJ SOLN
1.0000 mg | Freq: Once | INTRAMUSCULAR | Status: AC
  Administered 2021-08-10: 1 mg via INTRAVENOUS
  Filled 2021-08-10: qty 1

## 2021-08-10 MED ORDER — IOHEXOL 300 MG/ML  SOLN
100.0000 mL | Freq: Once | INTRAMUSCULAR | Status: AC | PRN
  Administered 2021-08-10: 100 mL via INTRAVENOUS

## 2021-08-10 MED ORDER — PIPERACILLIN-TAZOBACTAM 3.375 G IVPB
3.3750 g | Freq: Three times a day (TID) | INTRAVENOUS | Status: DC
  Administered 2021-08-11: 3.375 g via INTRAVENOUS
  Filled 2021-08-10 (×2): qty 50

## 2021-08-10 MED ORDER — ENOXAPARIN SODIUM 40 MG/0.4ML IJ SOSY
40.0000 mg | PREFILLED_SYRINGE | INTRAMUSCULAR | Status: DC
  Administered 2021-08-10: 40 mg via SUBCUTANEOUS
  Filled 2021-08-10: qty 0.4

## 2021-08-10 NOTE — ED Provider Notes (Signed)
[name] EMERGENCY DEPARTMENT Provider Note   CSN: [id number] Arrival date & time: 08/10/21  1009     History  Chief Complaint  Patient presents with   Abdominal Pain    Thomas Valdez is a 26 y.o. male.   Abdominal Pain Associated symptoms: fatigue, nausea and vomiting    Patient presents for abdominal and testicular pain.  Medical history includes a recent diagnosis of diverticulitis.  He was seen in the ED 5 days ago.  He was prescribed Norco and Augmentin.  Since his recent ED visit, he has had ongoing abdominal pain.  Starting yesterday, he began to experience pain in his bilateral testicles.  Pain in testicles has been persistent and worsening.  He developed recurrence of vomiting yesterday and this has persisted today.  He has been unable to tolerate p.o. intake.  Patient denies any penile discharge or dysuria.  He does not feel that his testicles are swollen.  He denies any recent injuries.    Home Medications Prior to Admission medications   Medication Sig Start Date End Date Taking? Authorizing Provider  amoxicillin-clavulanate (AUGMENTIN) 875-125 MG tablet Take 1 tablet by mouth every 12 (twelve) hours. 08/06/21  Yes Zadie Rhine, MD  HYDROcodone-acetaminophen (NORCO/VICODIN) 5-325 MG tablet Take 1 tablet by mouth every 6 (six) hours as needed for severe pain. 08/06/21  Yes Zadie Rhine, MD  traZODone (DESYREL) 50 MG tablet Take 50 mg by mouth at bedtime as needed for sleep.   Yes [provider]  traZODone (DESYREL) 50 MG tablet Take 1 tablet by mouth daily Patient not taking: Reported on 08/10/2021 10/08/20   Shawnie Dapper, PA-C  valACYclovir (VALTREX) 1000 MG tablet Take 1 tablet by mouth twice a day Patient not taking: Reported on 08/10/2021 12/11/20   Shawnie Dapper, PA-C      Allergies    Patient has no known allergies.    Review of Systems   Review of Systems  Constitutional:  Positive for appetite change and fatigue.   Gastrointestinal:  Positive for abdominal pain, nausea and vomiting.  Genitourinary:  Positive for testicular pain.  All other systems reviewed and are negative.   Physical Exam Updated Vital Signs BP 124/83 (BP Location: Right Arm)   Pulse 74   Temp 98.5 F (36.9 C) (Oral)   Resp 18   Ht 5\' 11"  (1.803 m)   Wt 83.1 kg   SpO2 99%   BMI 25.55 kg/m  Physical Exam Vitals and nursing note reviewed. Exam conducted with a chaperone present.  Constitutional:      General: He is not in acute distress.    Appearance: He is well-developed. He is not ill-appearing, toxic-appearing or diaphoretic.  HENT:     Head: Normocephalic and atraumatic.     Mouth/Throat:     Mouth: Mucous membranes are moist.     Pharynx: Oropharynx is clear.  Eyes:     Extraocular Movements: Extraocular movements intact.     Conjunctiva/sclera: Conjunctivae normal.  Cardiovascular:     Rate and Rhythm: Normal rate and regular rhythm.     Heart sounds: No murmur heard. Pulmonary:     Effort: Pulmonary effort is normal. No respiratory distress.  Abdominal:     Palpations: Abdomen is soft.     Tenderness: There is no abdominal tenderness.  Genitourinary:    Penis: Normal and circumcised.      Testes: Cremasteric reflex is present.        Right: Tenderness present. Swelling not present.  Left: Tenderness present. Swelling not present.  Musculoskeletal:        General: No swelling.     Cervical back: Neck supple.  Skin:    General: Skin is warm and dry.     Capillary Refill: Capillary refill takes less than 2 seconds.  Neurological:     Mental Status: He is alert.  Psychiatric:        Mood and Affect: Mood normal.     ED Results / Procedures / Treatments   Labs (all labs ordered are listed, but only abnormal results are displayed) Labs Reviewed  COMPREHENSIVE METABOLIC PANEL - Abnormal; Notable for the following components:      Result Value   Total Protein 9.0 (*)    All other components  within normal limits  URINALYSIS, ROUTINE W REFLEX MICROSCOPIC - Abnormal; Notable for the following components:   Ketones, ur 80 (*)    All other components within normal limits  CBC  LIPASE, BLOOD  CBC    EKG None  Radiology CT ABDOMEN PELVIS W CONTRAST  Result Date: 08/10/2021 CLINICAL DATA:  Nausea/vomiting Abdominal pain, acute, nonlocalized EXAM: CT ABDOMEN AND PELVIS WITH CONTRAST TECHNIQUE: Multidetector CT imaging of the abdomen and pelvis was performed using the standard protocol following bolus administration of intravenous contrast. RADIATION DOSE REDUCTION: This exam was performed according to the departmental dose-optimization program which includes automated exposure control, adjustment of the mA and/or kV according to patient size and/or use of iterative reconstruction technique. CONTRAST:  OMNIPAQUE IOHEXOL 300 MG/ML  SOLN COMPARISON:  CT 08/06/2021 FINDINGS: Lower chest: No acute abnormality. Hepatobiliary: No focal liver abnormality is seen. The gallbladder is unremarkable. Pancreas: Unremarkable. No pancreatic ductal dilatation or surrounding inflammatory changes. Spleen: Normal in size without focal abnormality. Small adjacent splenule. Adrenals/Urinary Tract: Adrenal glands are unremarkable. No hydronephrosis. There is a 2 mm nonobstructive right lower pole renal stone. The bladder is minimally distended. Stomach/Bowel: There is persistent wall thickening and adjacent inflammatory stranding related to sigmoid diverticulitis. There is pericolic phlegmon/abscess measuring 2.8 x 2.2 x 3.0 cm (series 2 image 63, series 5 image 55).The appendix is normal. Vascular/Lymphatic: No significant vascular findings are present. No enlarged abdominal or pelvic lymph nodes. Reproductive: Unremarkable. Other: Trace ascites.  No hernia.  No free air. Musculoskeletal: No acute or significant osseous findings. IMPRESSION: Persistent acute sigmoid diverticulitis, with developing pericolic  phlegmon/abscess measuring 2.8 x 2.2 x 3.0 cm. Electronically Signed   By: Caprice Renshaw M.D.   On: 08/10/2021 14:48   US SCROTUM W/DOPPLER  Result Date: 08/10/2021 CLINICAL DATA:  Bilateral testicle pain EXAM: SCROTAL ULTRASOUND DOPPLER ULTRASOUND OF THE TESTICLES TECHNIQUE: Complete ultrasound examination of the testicles, epididymis, and other scrotal structures was performed. Color and spectral Doppler ultrasound were also utilized to evaluate blood flow to the testicles. COMPARISON:  None Available. FINDINGS: Right testicle Measurements: 5.1 x 1.9 x 3.5 cm (volume = 18 cm^3). No mass or microlithiasis visualized. Left testicle Measurements: 3.2 x 3.1 x 4.4 cm (volume = 23 cm^3). No mass or microlithiasis visualized. Right epididymis:  Normal in size and appearance. Left epididymis:  Normal in size and appearance. Hydrocele:  Small bilateral hydroceles. Varicocele:  None visualized. Pulsed Doppler interrogation of both testes demonstrates normal low resistance arterial and venous waveforms bilaterally. IMPRESSION: 1. No testicular mass or torsion identified. 2. Small bilateral hydroceles. Electronically Signed   By: Signa Kell M.D.   On: 08/10/2021 14:18    Procedures Procedures    Medications Ordered in  ED Medications  lactated ringers infusion ( Intravenous Infusion Verify 08/10/21 1821)  enoxaparin (LOVENOX) injection 40 mg (40 mg Subcutaneous Given 08/10/21 1831)  piperacillin-tazobactam (ZOSYN) IVPB 3.375 g (has no administration in time range)  lactated ringers infusion ( Intravenous Duplicate 08/10/21 1819)  ondansetron (ZOFRAN) tablet 4 mg (has no administration in time range)    Or  ondansetron (ZOFRAN) injection 4 mg (has no administration in time range)  HYDROmorphone (DILAUDID) injection 0.5 mg (0.5 mg Intravenous Given 08/10/21 1938)  ondansetron (ZOFRAN-ODT) disintegrating tablet 4 mg (4 mg Oral Given 08/10/21 1113)  HYDROmorphone (DILAUDID) injection 1 mg (1 mg Intravenous Given  08/10/21 1347)  lactated ringers bolus 2,000 mL (2,000 mLs Intravenous Bolus 08/10/21 1349)  iohexol (OMNIPAQUE) 300 MG/ML solution 100 mL (100 mLs Intravenous Contrast Given 08/10/21 1424)  piperacillin-tazobactam (ZOSYN) IVPB 3.375 g (0 g Intravenous Stopped 08/10/21 1743)  HYDROmorphone (DILAUDID) injection 1 mg (1 mg Intravenous Given 08/10/21 1648)    ED Course/ Medical Decision Making/ A&P                           Medical Decision Making Amount and/or Complexity of Data Reviewed Labs: ordered. Radiology: ordered.  Risk Prescription drug management. Decision regarding hospitalization.   This patient presents to the ED for concern of abdominal and testicular pain, this involves an extensive number of treatment options, and is a complaint that carries with it a high risk of complications and morbidity.  The differential diagnosis includes complications from recently diagnosed diverticulitis; orchitis, testicular torsion, epididymitis, UTI, nephrolithiasis   Co morbidities that complicate the patient evaluation  Diverticulitis   Additional history obtained:  Additional history obtained from N/A External records from outside source obtained and reviewed including EMR   Lab Tests:  I Ordered, and personally interpreted labs.  The pertinent results include: No leukocytosis is present.  Kidney function and electrolytes are normal.  UA shows ketonuria without evidence of hematuria or UTI.   Imaging Studies ordered:  I ordered imaging studies including CT of abdomen and pelvis, testicular ultrasound I independently visualized and interpreted imaging which showed testicular ultrasound showed small bilateral hydroceles without any other findings to explain the patient's testicular pain; CT scan showed developing pericolic phlegmon/abscess I agree with the radiologist interpretation   Cardiac Monitoring: / EKG:  The patient was maintained on a cardiac monitor.  I personally  viewed and interpreted the cardiac monitored which showed an underlying rhythm of: Sinus rhythm   Consultations Obtained:  I requested consultation with the general surgeon, Dr. Robyne Peers,  and discussed lab and imaging findings as well as pertinent plan - they recommend: Admission for IV antibiotics and observation.  Hospitalist consult to IR tomorrow as needed.   Problem List / ED Course / Critical interventions / Medication management  Patient is a 26 year old male who presents for abdominal pain, testicular pain, nausea, and vomiting.  He was seen in the ED 5 days ago for abdominal pain.  At that time, he was diagnosed with uncomplicated diverticulitis.  He was prescribed Augmentin, which she has been taking up until yesterday when he developed nausea, vomiting, and p.o. intolerance.  In addition to these worsening symptoms yesterday, patient also developed bilateral testicular pain.  He denies any urinary symptoms or penile discharge.  On exam, he does not have any appreciable testicular swelling.  There is no scrotal erythema or edema.  He does have bilateral tenderness to his testicles.  Abdomen is soft without  any significant tenderness.  Patient was given Dilaudid for analgesia.  Prior to being bedded in the ED, he did receive Zofran for his nausea and does report current resolution of his nausea.  Laboratory work-up was initiated.  Patient to undergo CT scan of abdomen pelvis to assess for possible complications from his diverticulitis.  Additionally, ultrasound of scrotum was ordered to assess for testicular abnormalities causing his testicular pain.  Ultrasound scrotum was unremarkable.  He does have bilateral hydroceles but this is unlikely causing his discomfort.  I suspect that his discomfort is radiation of pain from his abdomen.  On the CT scan of the abdomen, patient does have a developing pericolic phlegmon/abscess.  Zosyn was ordered.  I discussed CT scan findings with general surgeon  on-call, Dr. Robyne Peers, who recommends admission for observation and continued IV antibiotics.  She recommends hospitalist consult to IR tomorrow as needed.  Patient was admitted for further management. I ordered medication including Dilaudid for analgesia; IV fluids for dehydration; Zosyn for complicated diverticulitis Reevaluation of the patient after these medicines showed that the patient improved I have reviewed the patients home medicines and have made adjustments as needed   Social Determinants of Health:  Has PCP         Final Clinical Impression(s) / ED Diagnoses Final diagnoses:  Diverticulitis    Rx / DC Orders ED Discharge Orders     None         Gloris Manchester, MD 08/10/21 2011

## 2021-08-10 NOTE — Assessment & Plan Note (Signed)
Remain NPO>>clears on 8/1 Continue IVF Dilaudid prn pain IV zosyn 8/1--discussed with IR>>too small and too deep for intervention General surgery consult

## 2021-08-10 NOTE — ED Notes (Signed)
Ultrasound at bedside

## 2021-08-10 NOTE — ED Triage Notes (Signed)
Pt here last Wednesday and dx with diverticulitis. Has been taking abx. Started having worsening pain to lower abd and now testicles are hurting. Denies swelling to testicles. N/v since yesterday and unable to hold meds down. Denies diarrhea. One normal bm since last visit.

## 2021-08-10 NOTE — Hospital Course (Signed)
26 year old male with no documented chronic medical problems presenting with 1 week history of lower abdominal pain with associated nausea and vomiting.  The patient began developing abdominal pain around 08/03/2021.  He came to the emergency department on 08/06/2021.  CT of the abdomen and pelvis on that visit showed acute sigmoid diverticulitis without any abscess.  The patient was prescribed hydrocodone and Augmentin which she has been taking faithfully.  Unfortunately, the patient continued to have worsening lower abdominal pain with associated nausea and vomiting that developed on 08/09/2021.  He denied any chest pain, shortness breath, cough, hemoptysis, dysuria, hematuria, hematochezia, melena.  He has had some subjective fevers and chills. In the ED, the patient was afebrile and hemodynamically stable with oxygen saturation 100% room air.  WBC 8.7, hemoglobin 15.3, platelets 266,000.  Sodium 138, potassium 3.9, bicarbonate 25, serum creatinine 1.00.  LFTs were unremarkable.  CT of the abdomen and pelvis on 08/10/2021 showed persistent sigmoid diverticulitis with developing pericolonic abscess.  General surgery was consulted and requested admission for IV antibiotics and bowel rest.  The patient was started on IV Zosyn.  Scrotal ultrasound was negative for testicular torsion, masses.  There is small bilateral hydroceles.

## 2021-08-10 NOTE — H&P (Signed)
History and Physical    Patient: Thomas Valdez CLE:[id number] DOB: [birth year] DOA: 08/10/2021 DOS: the patient was seen and examined on 08/10/2021 PCP: Daryll Drown, NP  Patient coming from: Home  Chief Complaint:  Chief Complaint  Patient presents with   Abdominal Pain   HPI: Thomas Valdez is a 26 year old male with no documented chronic medical problems presenting with 1 week history of lower abdominal pain with associated nausea and vomiting.  The patient began developing abdominal pain around 08/03/2021.  He came to the emergency department on 08/06/2021.  CT of the abdomen and pelvis on that visit showed acute sigmoid diverticulitis without any abscess.  The patient was prescribed hydrocodone and Augmentin which she has been taking faithfully.  Unfortunately, the patient continued to have worsening lower abdominal pain with associated nausea and vomiting that developed on 08/09/2021.  He denied any chest pain, shortness breath, cough, hemoptysis, dysuria, hematuria, hematochezia, melena.  He has had some subjective fevers and chills. In the ED, the patient was afebrile and hemodynamically stable with oxygen saturation 100% room air.  WBC 8.7, hemoglobin 15.3, platelets 266,000.  Sodium 138, potassium 3.9, bicarbonate 25, serum creatinine 1.00.  LFTs were unremarkable.  CT of the abdomen and pelvis on 08/10/2021 showed persistent sigmoid diverticulitis with developing pericolonic abscess.  General surgery was consulted and requested admission for IV antibiotics and bowel rest.  The patient was started on IV Zosyn.  Scrotal ultrasound was negative for testicular torsion, masses.  There is small bilateral hydroceles.  Review of Systems: As mentioned in the history of present illness. All other systems reviewed and are negative. Past Medical History:  Diagnosis Date   Diverticulitis    History reviewed. No pertinent surgical history. Social History:  reports that he has never  smoked. He has never used smokeless tobacco. He reports current alcohol use. He reports that he does not use drugs.  No Known Allergies  Family History  Problem Relation Age of Onset   Cancer Father        prostate   Heart disease Maternal Grandmother     Prior to Admission medications   Medication Sig Start Date End Date Taking? Authorizing Provider  amoxicillin-clavulanate (AUGMENTIN) 875-125 MG tablet Take 1 tablet by mouth every 12 (twelve) hours. 08/06/21  Yes Zadie Rhine, MD  HYDROcodone-acetaminophen (NORCO/VICODIN) 5-325 MG tablet Take 1 tablet by mouth every 6 (six) hours as needed for severe pain. 08/06/21  Yes Zadie Rhine, MD  traZODone (DESYREL) 50 MG tablet Take 50 mg by mouth at bedtime as needed for sleep.   Yes [provider]  traZODone (DESYREL) 50 MG tablet Take 1 tablet by mouth daily Patient not taking: Reported on 08/10/2021 10/08/20   Shawnie Dapper, PA-C  valACYclovir (VALTREX) 1000 MG tablet Take 1 tablet by mouth twice a day Patient not taking: Reported on 08/10/2021 12/11/20   Shawnie Dapper, PA-C    Physical Exam: Vitals:   08/10/21 1630 08/10/21 1700 08/10/21 1723 08/10/21 1736  BP: 127/71 125/82  124/83  Pulse: 81 80  74  Resp:    18  Temp:  (!) 97.5 F (36.4 C)  98.5 F (36.9 C)  TempSrc:  Oral  Oral  SpO2: 100% 98%  99%  Height:   5\' 11"  (1.803 m)    GENERAL:  A&O x 3, NAD, well developed, cooperative, follows commands HEENT: [place]/AT, No thrush, No icterus, No oral ulcers Neck:  No neck mass, No meningismus, soft, supple CV: RRR,  no S3, no S4, no rub, no JVD Lungs:  CTA, no wheeze, no rhonchi, good air movement Abd: soft/LLQ and suprapubic pain +BS, nondistended Ext: No edema, no lymphangitis, no cyanosis, no rashes Neuro:  CN II-XII intact, strength 4/5 in RUE, RLE, strength 4/5 LUE, LLE; sensation intact bilateral; no dysmetria; babinski equivocal  Data Reviewed: Data reviewed in history above Assessment and Plan: *  Diverticulitis of colon with perforation Remain NPO Continue IVF Dilaudid prn pain IV zosyn General surgery consult      Advance Care Planning: FULL  Consults: general surgery  Family Communication: none  Severity of Illness: The appropriate patient status for this patient is INPATIENT. Inpatient status is judged to be reasonable and necessary in order to provide the required intensity of service to ensure the patient's safety. The patient's presenting symptoms, physical exam findings, and initial radiographic and laboratory data in the context of their chronic comorbidities is felt to place them at high risk for further clinical deterioration. Furthermore, it is not anticipated that the patient will be medically stable for discharge from the hospital within 2 midnights of admission.   * I certify that at the point of admission it is my clinical judgment that the patient will require inpatient hospital care spanning beyond 2 midnights from the point of admission due to high intensity of service, high risk for further deterioration and high frequency of surveillance required.*  Author: Catarina Hartshorn, MD 08/10/2021 5:56 PM  For on call review [email or web address].

## 2021-08-11 DIAGNOSIS — K5720 Diverticulitis of large intestine with perforation and abscess without bleeding: Secondary | ICD-10-CM | POA: Diagnosis not present

## 2021-08-11 LAB — BASIC METABOLIC PANEL
Anion gap: 11 (ref 5–15)
BUN: 14 mg/dL (ref 6–20)
CO2: 24 mmol/L (ref 22–32)
Calcium: 8.7 mg/dL — ABNORMAL LOW (ref 8.9–10.3)
Chloride: 101 mmol/L (ref 98–111)
Creatinine, Ser: 0.93 mg/dL (ref 0.61–1.24)
GFR, Estimated: >60 mL/min (ref >60)
Glucose, Bld: 77 mg/dL (ref 70–99)
Potassium: 3.7 mmol/L (ref 3.5–5.1)
Sodium: 136 mmol/L (ref 135–145)

## 2021-08-11 LAB — CBC
HCT: 41.3 % (ref 39.0–52.0)
Hemoglobin: 13.5 g/dL (ref 13.0–17.0)
MCH: 30.3 pg (ref 26.0–34.0)
MCHC: 32.7 g/dL (ref 30.0–36.0)
MCV: 92.6 fL (ref 80.0–100.0)
Platelets: 239 10*3/uL (ref 150–400)
RBC: 4.46 MIL/uL (ref 4.22–5.81)
RDW: 11.7 % (ref 11.5–15.5)
WBC: 7.4 10*3/uL (ref 4.0–10.5)
nRBC: 0 % (ref 0.0–0.2)

## 2021-08-11 MED ORDER — TRAZODONE HCL 50 MG PO TABS
50.0000 mg | ORAL_TABLET | Freq: Every evening | ORAL | Status: DC | PRN
  Administered 2021-08-11 – 2021-08-12 (×2): 50 mg via ORAL
  Filled 2021-08-11 (×2): qty 1

## 2021-08-11 MED ORDER — PIPERACILLIN-TAZOBACTAM 3.375 G IVPB
3.3750 g | Freq: Three times a day (TID) | INTRAVENOUS | Status: DC
  Administered 2021-08-11 – 2021-08-13 (×7): 3.375 g via INTRAVENOUS
  Filled 2021-08-11 (×8): qty 50

## 2021-08-11 NOTE — Progress Notes (Signed)
  Transition of Care [facility]) Screening Note   Patient Details  Name: GANON DEMASI Date of Birth: [birth year]   Transition of Care [facility] CM/SW Contact:    Annice Needy, LCSW Phone Number: 08/11/2021, 10:08 AM    Transition of Care Department [facility]) has reviewed patient and no TOC needs have been identified at this time. We will continue to monitor patient advancement through interdisciplinary progression rounds. If new patient transition needs arise, please place a TOC consult.

## 2021-08-11 NOTE — Progress Notes (Signed)
Patient vitals stable.  Patient still co abdominal pain, despite medication.  Patient has been pleasant and appreciative of care.  Patient also states he feels as if he is able to tolerate a diet later today.  No new issues for patient.

## 2021-08-11 NOTE — Progress Notes (Addendum)
Received request for diverticular abscess to be evaluated for drain. Dr. Bryn Gulling reviewed imaging and found the abscess was not amendable to percutaneous drain at this time. Recommends abx and repeat imaging if patient does not improve. Dr. Arbutus Leas made aware of findings.     Alex Gardener, AGNP-BC 08/11/2021, 12:38 PM

## 2021-08-11 NOTE — Progress Notes (Signed)
PROGRESS NOTE  Thomas Valdez TDV:[id number] DOB: October 26, 1995 DOA: 08/10/2021 PCP: Daryll Drown, NP  Brief History:  26 year old male with no documented chronic medical problems presenting with 1 week history of lower abdominal pain with associated nausea and vomiting.  The patient began developing abdominal pain around 08/03/2021.  He came to the emergency department on 08/06/2021.  CT of the abdomen and pelvis on that visit showed acute sigmoid diverticulitis without any abscess.  The patient was prescribed hydrocodone and Augmentin which she has been taking faithfully.  Unfortunately, the patient continued to have worsening lower abdominal pain with associated nausea and vomiting that developed on 08/09/2021.  He denied any chest pain, shortness breath, cough, hemoptysis, dysuria, hematuria, hematochezia, melena.  He has had some subjective fevers and chills. In the ED, the patient was afebrile and hemodynamically stable with oxygen saturation 100% room air.  WBC 8.7, hemoglobin 15.3, platelets 266,000.  Sodium 138, potassium 3.9, bicarbonate 25, serum creatinine 1.00.  LFTs were unremarkable.  CT of the abdomen and pelvis on 08/10/2021 showed persistent sigmoid diverticulitis with developing pericolonic abscess.  General surgery was consulted and requested admission for IV antibiotics and bowel rest.  The patient was started on IV Zosyn.  Scrotal ultrasound was negative for testicular torsion, masses.  There is small bilateral hydroceles.    Assessment and Plan: * Diverticulitis of colon with perforation Remain NPO Continue IVF Dilaudid prn pain IV zosyn General surgery consult    Family Communication:  no Family at bedside  Consultants:  IR, general surgery  Code Status:  FULL   DVT Prophylaxis: [place] Lovenox   Procedures: As Listed in Progress Note Above  Antibiotics: Zosyn 7/31>>     Subjective:  Patient states abd pain is a little better.  Denies f/c, cp,  sob, n/v/d. Objective: Vitals:   08/11/21 0127 08/11/21 0523 08/11/21 0945 08/11/21 1415  BP: 106/66 99/62 (!) 121/90 124/85  Pulse: 78 64 83 65  Resp: 18 18  18   Temp: 98.2 F (36.8 C) 98.2 F (36.8 C)  98.3 F (36.8 C)  TempSrc:    Oral  SpO2: 100% 100%  99%  Weight:      Height:        Intake/Output Summary (Last 24 hours) at 08/11/2021 1726 Last data filed at 08/11/2021 1534 Gross per 24 hour  Intake 2247.55 ml  Output --  Net 2247.55 ml   Weight change:  Exam:  General:  Pt is alert, follows commands appropriately, not in acute distress HEENT: No icterus, No thrush, No neck mass, [place]/AT Cardiovascular: RRR, S1/S2, no rubs, no gallops Respiratory: CTA bilaterally, no wheezing, no crackles, no rhonchi Abdomen: Soft/+BS, mild periumbilical tender, non distended, no guarding Extremities: No edema, No lymphangitis, No petechiae, No rashes, no synovitis   Data Reviewed: I have personally reviewed following labs and imaging studies Basic Metabolic Panel: Recent Labs  Lab 08/05/21 1846 08/10/21 1155 08/11/21 1146  NA 136 138 136  K 3.6 3.9 3.7  CL 101 103 101  CO2 26 25 24   GLUCOSE 92 96 77  BUN 17 14 14   CREATININE 1.00 1.00 0.93  CALCIUM 9.5 9.5 8.7*   Liver Function Tests: Recent Labs  Lab 08/05/21 1846 08/10/21 1155  AST 18 18  ALT 19 18  ALKPHOS 78 75  BILITOT 0.9 0.8  PROT 8.6* 9.0*  ALBUMIN 4.7 4.5   Recent Labs  Lab 08/05/21 1846 08/10/21 1155  LIPASE  27 25   No results for input(s): "AMMONIA" in the last 168 hours. Coagulation Profile: No results for input(s): "INR", "PROTIME" in the last 168 hours. CBC: Recent Labs  Lab 08/05/21 1846 08/10/21 1155 08/11/21 0546  WBC 9.7 8.7 7.4  HGB 15.1 15.3 13.5  HCT 44.9 45.0 41.3  MCV 91.4 91.3 92.6  PLT 291 266 239   Cardiac Enzymes: No results for input(s): "CKTOTAL", "CKMB", "CKMBINDEX", "TROPONINI" in the last 168 hours. BNP: Invalid input(s): "POCBNP" CBG: No results for input(s):  "GLUCAP" in the last 168 hours. HbA1C: No results for input(s): "HGBA1C" in the last 72 hours. Urine analysis:    Component Value Date/Time   COLORURINE YELLOW 08/10/2021 1328   APPEARANCEUR CLEAR 08/10/2021 1328   LABSPEC 1.027 08/10/2021 1328   PHURINE 6.0 08/10/2021 1328   GLUCOSEU NEGATIVE 08/10/2021 1328   HGBUR NEGATIVE 08/10/2021 1328   BILIRUBINUR NEGATIVE 08/10/2021 1328   BILIRUBINUR negative 07/11/2020 1535   KETONESUR 80 (A) 08/10/2021 1328   PROTEINUR NEGATIVE 08/10/2021 1328   UROBILINOGEN 1.0 07/11/2020 1535   UROBILINOGEN 0.2 11/04/2014 1136   NITRITE NEGATIVE 08/10/2021 1328   LEUKOCYTESUR NEGATIVE 08/10/2021 1328   Sepsis Labs: @LABRCNTIP (procalcitonin:4,lacticidven:4) )No results found for this or any previous visit (from the past 240 hour(s)).   Scheduled Meds:  enoxaparin (LOVENOX) injection  40 mg Subcutaneous Q24H   Continuous Infusions:  lactated ringers Stopped (08/11/21 0130)   lactated ringers Stopped (08/11/21 1513)   piperacillin-tazobactam (ZOSYN)  IV Stopped (08/11/21 1513)    Procedures/Studies: CT ABDOMEN PELVIS W CONTRAST  Result Date: 08/10/2021 CLINICAL DATA:  Nausea/vomiting Abdominal pain, acute, nonlocalized EXAM: CT ABDOMEN AND PELVIS WITH CONTRAST TECHNIQUE: Multidetector CT imaging of the abdomen and pelvis was performed using the standard protocol following bolus administration of intravenous contrast. RADIATION DOSE REDUCTION: This exam was performed according to the departmental dose-optimization program which includes automated exposure control, adjustment of the mA and/or kV according to patient size and/or use of iterative reconstruction technique. CONTRAST:  08/12/2021 OMNIPAQUE IOHEXOL 300 MG/ML  SOLN COMPARISON:  CT 08/06/2021 FINDINGS: Lower chest: No acute abnormality. Hepatobiliary: No focal liver abnormality is seen. The gallbladder is unremarkable. Pancreas: Unremarkable. No pancreatic ductal dilatation or surrounding  inflammatory changes. Spleen: Normal in size without focal abnormality. Small adjacent splenule. Adrenals/Urinary Tract: Adrenal glands are unremarkable. No hydronephrosis. There is a 2 mm nonobstructive right lower pole renal stone. The bladder is minimally distended. Stomach/Bowel: There is persistent wall thickening and adjacent inflammatory stranding related to sigmoid diverticulitis. There is pericolic phlegmon/abscess measuring 2.8 x 2.2 x 3.0 cm (series 2 image 63, series 5 image 55).The appendix is normal. Vascular/Lymphatic: No significant vascular findings are present. No enlarged abdominal or pelvic lymph nodes. Reproductive: Unremarkable. Other: Trace ascites.  No hernia.  No free air. Musculoskeletal: No acute or significant osseous findings. IMPRESSION: Persistent acute sigmoid diverticulitis, with developing pericolic phlegmon/abscess measuring 2.8 x 2.2 x 3.0 cm. Electronically Signed   By: 08/08/2021 M.D.   On: 08/10/2021 14:48   08/12/2021 SCROTUM W/DOPPLER  Result Date: 08/10/2021 CLINICAL DATA:  Bilateral testicle pain EXAM: SCROTAL ULTRASOUND DOPPLER ULTRASOUND OF THE TESTICLES TECHNIQUE: Complete ultrasound examination of the testicles, epididymis, and other scrotal structures was performed. Color and spectral Doppler ultrasound were also utilized to evaluate blood flow to the testicles. COMPARISON:  None Available. FINDINGS: Right testicle Measurements: 5.1 x 1.9 x 3.5 cm (volume = 18 cm^3). No mass or microlithiasis visualized. Left testicle Measurements: 3.2 x 3.1 x 4.4 cm (volume =  23 cm^3). No mass or microlithiasis visualized. Right epididymis:  Normal in size and appearance. Left epididymis:  Normal in size and appearance. Hydrocele:  Small bilateral hydroceles. Varicocele:  None visualized. Pulsed Doppler interrogation of both testes demonstrates normal low resistance arterial and venous waveforms bilaterally. IMPRESSION: 1. No testicular mass or torsion identified. 2. Small bilateral  hydroceles. Electronically Signed   By: Signa Kell M.D.   On: 08/10/2021 14:18   CT ABDOMEN PELVIS W CONTRAST  Result Date: 08/06/2021 CLINICAL DATA:  Abdominal pain EXAM: CT ABDOMEN AND PELVIS WITH CONTRAST TECHNIQUE: Multidetector CT imaging of the abdomen and pelvis was performed using the standard protocol following bolus administration of intravenous contrast. RADIATION DOSE REDUCTION: This exam was performed according to the departmental dose-optimization program which includes automated exposure control, adjustment of the mA and/or kV according to patient size and/or use of iterative reconstruction technique. CONTRAST:  OMNIPAQUE IOHEXOL 300 MG/ML  SOLN COMPARISON:  07/15/2020 FINDINGS: Lower chest: Lung bases are clear. Hepatobiliary: Liver is within normal limits. Gallbladder is unremarkable. No intrahepatic or extrahepatic ductal dilatation. Pancreas: Within normal limits. Spleen: Within normal limits. Adrenals/Urinary Tract: Adrenal glands are within normal limits. Kidneys are within normal limits.  No hydronephrosis. Bladder is within normal limits. Stomach/Bowel: Stomach is within normal limits. No evidence of bowel obstruction. Normal appendix (series 2/image 49). Sigmoid diverticulitis with pericolonic inflammatory changes/stranding in the left lower quadrant (series 2/image 86), reflecting acute diverticulitis. No drainable fluid collection/abscess. No free air to suggest macroscopic perforation. Vascular/Lymphatic: No evidence of abdominal aortic aneurysm. No suspicious abdominopelvic lymphadenopathy. Reproductive: Prostate is unremarkable. Other: Small volume pelvic ascites. Musculoskeletal: Visualized osseous structures are within normal limits. IMPRESSION: Acute sigmoid diverticulitis. No drainable fluid collection/abscess. No free air to suggest macroscopic perforation. Electronically Signed   By: Charline Bills M.D.   On: 08/06/2021 03:42    Catarina Hartshorn, DO  [facility]  If 7PM-7AM, please contact night-coverage [email or web address] Password [email or web address] 08/11/2021, 5:26 PM   LOS: 1 day

## 2021-08-11 NOTE — Consult Note (Signed)
[facility] Consult  Reason for Consult: Diverticulitis Referring Physician: Dr. Durwin Nora  Chief Complaint   Abdominal Pain     HPI: Thomas Valdez is a 26 y.o. male who presents with persistent abdominal pain.  Patient was seen last Wednesday in the ED, and at that time was diagnosed with acute uncomplicated sigmoid diverticulitis.  Since that time he has had a bland diet, with failure of his abdominal pain did improve.  On Sunday he did have an episode of emesis after eating rice and cheese.  Given his pain was not improving on Monday, he presented to the ED again.  In the ED, he underwent a repeat CT abdomen and pelvis, which demonstrated persistent acute sigmoid diverticulitis with developing pericolic phlegmon/abscess that measures 2.8 x 2.2 x 3 cm.  He has no leukocytosis.  He currently confirms persistent abdominal pain and left testicle pain, though it is improved.  He denies any further episodes of nausea or vomiting since presenting to the hospital.  He confirms feeling hungry this morning.  He has not passed much flatus and denies any bowel movements since presentation to the hospital.  He denies any history of abdominal surgeries.  His past medical history is unremarkable, and he is only taking trazodone at this time.  He confirms taking the antibiotic as an outpatient.  He will socially smoke and drink alcohol.  He denies use of illicit drugs.  Past Medical History:  Diagnosis Date   Diverticulitis     History reviewed. No pertinent surgical history.  Family History  Problem Relation Age of Onset   Cancer Father        prostate   Heart disease Maternal Grandmother     Social History   Tobacco Use   Smoking status: Never   Smokeless tobacco: Never  Vaping Use   Vaping Use: Never used  Substance Use Topics   Alcohol use: Yes    Comment: social   Drug use: No    Medications: I have reviewed the patient's current medications.  No Known  Allergies   ROS:  Constitutional: negative for chills, fatigue, and fevers Respiratory: negative for cough, wheezing, and shortness of breath Cardiovascular: negative for chest pain and palpitations Gastrointestinal: positive for abdominal pain, negative for nausea and vomiting Genitourinary:positive for left testicle pain  Blood pressure (!) 121/90, pulse 83, temperature 98.2 F (36.8 C), resp. rate 18, height 5\' 11"  (1.803 m), weight 83.1 kg, SpO2 100 %. Physical Exam Vitals reviewed.  Constitutional:      Appearance: He is well-developed.  HENT:     Head: Normocephalic and atraumatic.  Eyes:     Extraocular Movements: Extraocular movements intact.     Pupils: Pupils are equal, round, and reactive to light.  Cardiovascular:     Rate and Rhythm: Normal rate.  Pulmonary:     Effort: Pulmonary effort is normal.  Abdominal:     Comments: Abdomen soft, nondistended, no percussion tenderness, mild suprapubic and left lower quadrant tenderness to palpation; no rigidity, guarding, rebound tenderness  Skin:    General: Skin is warm and dry.  Neurological:     General: No focal deficit present.     Mental Status: He is alert and oriented to person, place, and time.  Psychiatric:        Mood and Affect: Mood normal.        Behavior: Behavior normal.     Results: Results for orders placed or performed during the hospital encounter of 08/10/21 (  from the past 48 hour(s))  Comprehensive metabolic panel     Status: Abnormal   Collection Time: 08/10/21 11:55 AM  Result Value Ref Range   Sodium 138 135 - 145 mmol/L   Potassium 3.9 3.5 - 5.1 mmol/L   Chloride 103 98 - 111 mmol/L   CO2 25 22 - 32 mmol/L   Glucose, Bld 96 70 - 99 mg/dL    Comment: Glucose reference range applies only to samples taken after fasting for at least 8 hours.   BUN 14 6 - 20 mg/dL   Creatinine, Ser 2.02 0.61 - 1.24 mg/dL   Calcium 9.5 8.9 - 54.2 mg/dL   Total Protein 9.0 (H) 6.5 - 8.1 g/dL   Albumin 4.5  3.5 - 5.0 g/dL   AST 18 15 - 41 U/L   ALT 18 0 - 44 U/L   Alkaline Phosphatase 75 38 - 126 U/L   Total Bilirubin 0.8 0.3 - 1.2 mg/dL   GFR, Estimated >70 >62 mL/min    Comment: (NOTE) Calculated using the CKD-EPI Creatinine Equation (2021)    Anion gap 10 5 - 15    Comment: Performed at [facility], [address], [place], [place] [address]  CBC     Status: None   Collection Time: 08/10/21 11:55 AM  Result Value Ref Range   WBC 8.7 4.0 - 10.5 K/uL   RBC 4.93 4.22 - 5.81 MIL/uL   Hemoglobin 15.3 13.0 - 17.0 g/dL   HCT 31.5 17.6 - 16.0 %   MCV 91.3 80.0 - 100.0 fL   MCH 31.0 26.0 - 34.0 pg   MCHC 34.0 30.0 - 36.0 g/dL   RDW 73.7 10.6 - 26.9 %   Platelets 266 150 - 400 K/uL   nRBC 0.0 0.0 - 0.2 %    Comment: Performed at [facility], [address], [place], [place] [address]  Lipase, blood     Status: None   Collection Time: 08/10/21 11:55 AM  Result Value Ref Range   Lipase 25 11 - 51 U/L    Comment: Performed at [facility], [address], [place], [place] [address]  Urinalysis, Routine w reflex microscopic Urine, Clean Catch     Status: Abnormal   Collection Time: 08/10/21  1:28 PM  Result Value Ref Range   Color, Urine YELLOW YELLOW   APPearance CLEAR CLEAR   Specific Gravity, Urine 1.027 1.005 - 1.030   pH 6.0 5.0 - 8.0   Glucose, UA NEGATIVE NEGATIVE mg/dL   Hgb urine dipstick NEGATIVE NEGATIVE   Bilirubin Urine NEGATIVE NEGATIVE   Ketones, ur 80 (A) NEGATIVE mg/dL   Protein, ur NEGATIVE NEGATIVE mg/dL   Nitrite NEGATIVE NEGATIVE   Leukocytes,Ua NEGATIVE NEGATIVE    Comment: Performed at [facility] [address] [place], [place] [address]  CBC     Status: None   Collection Time: 08/11/21  5:46 AM  Result Value Ref Range   WBC 7.4 4.0 - 10.5 K/uL   RBC 4.46 4.22 - 5.81 MIL/uL   Hemoglobin 13.5 13.0 - 17.0 g/dL   HCT 18.2 99.3 - 71.6 %   MCV 92.6 80.0 - 100.0 fL   MCH 30.3 26.0 - 34.0 pg   MCHC 32.7 30.0 - 36.0 g/dL   RDW 96.7 89.3 - 81.0 %   Platelets  239 150 - 400 K/uL   nRBC 0.0 0.0 - 0.2 %    Comment: Performed at [facility], [address], [place], [place] [address]  CT ABDOMEN PELVIS W CONTRAST  Result Date: 08/10/2021 CLINICAL DATA:  Nausea/vomiting Abdominal pain, acute, nonlocalized EXAM: CT ABDOMEN AND PELVIS WITH CONTRAST TECHNIQUE: Multidetector CT imaging of the abdomen and pelvis was performed using the standard protocol following bolus administration of intravenous contrast. RADIATION DOSE REDUCTION: This exam was performed according to the departmental dose-optimization program which includes automated exposure control, adjustment of the mA and/or kV according to patient size and/or use of iterative reconstruction technique. CONTRAST:  OMNIPAQUE IOHEXOL 300 MG/ML  SOLN COMPARISON:  CT 08/06/2021 FINDINGS: Lower chest: No acute abnormality. Hepatobiliary: No focal liver abnormality is seen. The gallbladder is unremarkable. Pancreas: Unremarkable. No pancreatic ductal dilatation or surrounding inflammatory changes. Spleen: Normal in size without focal abnormality. Small adjacent splenule. Adrenals/Urinary Tract: Adrenal glands are unremarkable. No hydronephrosis. There is a 2 mm nonobstructive right lower pole renal stone. The bladder is minimally distended. Stomach/Bowel: There is persistent wall thickening and adjacent inflammatory stranding related to sigmoid diverticulitis. There is pericolic phlegmon/abscess measuring 2.8 x 2.2 x 3.0 cm (series 2 image 63, series 5 image 55).The appendix is normal. Vascular/Lymphatic: No significant vascular findings are present. No enlarged abdominal or pelvic lymph nodes. Reproductive: Unremarkable. Other: Trace ascites.  No hernia.  No free air. Musculoskeletal: No acute or significant osseous findings. IMPRESSION: Persistent acute sigmoid diverticulitis, with developing pericolic phlegmon/abscess measuring 2.8 x 2.2 x 3.0 cm. Electronically Signed   By: Caprice Renshaw M.D.   On: 08/10/2021  14:48   US SCROTUM W/DOPPLER  Result Date: 08/10/2021 CLINICAL DATA:  Bilateral testicle pain EXAM: SCROTAL ULTRASOUND DOPPLER ULTRASOUND OF THE TESTICLES TECHNIQUE: Complete ultrasound examination of the testicles, epididymis, and other scrotal structures was performed. Color and spectral Doppler ultrasound were also utilized to evaluate blood flow to the testicles. COMPARISON:  None Available. FINDINGS: Right testicle Measurements: 5.1 x 1.9 x 3.5 cm (volume = 18 cm^3). No mass or microlithiasis visualized. Left testicle Measurements: 3.2 x 3.1 x 4.4 cm (volume = 23 cm^3). No mass or microlithiasis visualized. Right epididymis:  Normal in size and appearance. Left epididymis:  Normal in size and appearance. Hydrocele:  Small bilateral hydroceles. Varicocele:  None visualized. Pulsed Doppler interrogation of both testes demonstrates normal low resistance arterial and venous waveforms bilaterally. IMPRESSION: 1. No testicular mass or torsion identified. 2. Small bilateral hydroceles. Electronically Signed   By: Signa Kell M.D.   On: 08/10/2021 14:18     Assessment & Plan:  DWAYN MORAVEK is a 26 y.o. male who was admitted with persistent acute sigmoid diverticulitis, and possible new formation of pericolic abscess.  WBC 7.4.  Imaging and blood work evaluated by myself.  -Will trial clear liquid diet this morning given his hunger and slightly improved abdominal pain, may advance to full liquids this afternoon if patient tolerates clears without nausea, vomiting, or worsening abdominal pain -Recommend consultation of IR to verify this new abscess is unable to be aspirated -No acute surgical intervention at this time -Continue IV Zosyn -IV fluids per primary team -PRN pain control and antiemetics -Care per hospitalist -Patient will need outpatient colonoscopy in 6 to 8 weeks  All questions were answered to the satisfaction of the patient.  -- Theophilus Kinds, DO [facility] [address] [place] [place], [place] [address] [phone] (office)

## 2021-08-12 DIAGNOSIS — K5720 Diverticulitis of large intestine with perforation and abscess without bleeding: Secondary | ICD-10-CM | POA: Diagnosis not present

## 2021-08-12 LAB — CBC
HCT: 37.2 % — ABNORMAL LOW (ref 39.0–52.0)
Hemoglobin: 12.3 g/dL — ABNORMAL LOW (ref 13.0–17.0)
MCH: 30.2 pg (ref 26.0–34.0)
MCHC: 33.1 g/dL (ref 30.0–36.0)
MCV: 91.4 fL (ref 80.0–100.0)
Platelets: 274 10*3/uL (ref 150–400)
RBC: 4.07 MIL/uL — ABNORMAL LOW (ref 4.22–5.81)
RDW: 11.4 % — ABNORMAL LOW (ref 11.5–15.5)
WBC: 4.4 10*3/uL (ref 4.0–10.5)
nRBC: 0 % (ref 0.0–0.2)

## 2021-08-12 MED ORDER — SENNOSIDES-DOCUSATE SODIUM 8.6-50 MG PO TABS
2.0000 | ORAL_TABLET | Freq: Every day | ORAL | Status: DC
  Administered 2021-08-12: 2 via ORAL
  Filled 2021-08-12: qty 2

## 2021-08-12 MED ORDER — ACETAMINOPHEN 500 MG PO TABS
1000.0000 mg | ORAL_TABLET | Freq: Four times a day (QID) | ORAL | Status: DC
  Administered 2021-08-12 – 2021-08-13 (×5): 1000 mg via ORAL
  Filled 2021-08-12 (×5): qty 2

## 2021-08-12 MED ORDER — OXYCODONE HCL 5 MG PO TABS
5.0000 mg | ORAL_TABLET | Freq: Four times a day (QID) | ORAL | Status: DC | PRN
  Administered 2021-08-12 – 2021-08-13 (×3): 5 mg via ORAL
  Filled 2021-08-12 (×3): qty 1

## 2021-08-12 MED ORDER — KETOROLAC TROMETHAMINE 30 MG/ML IJ SOLN
30.0000 mg | Freq: Four times a day (QID) | INTRAMUSCULAR | Status: DC
  Administered 2021-08-12 – 2021-08-13 (×4): 30 mg via INTRAVENOUS
  Filled 2021-08-12 (×4): qty 1

## 2021-08-12 NOTE — Progress Notes (Signed)
PROGRESS NOTE  Thomas Valdez SFK:[id number] DOB: 08-25-1995 DOA: 08/10/2021 PCP: Daryll Drown, NP  Brief History:  26 year old male with no documented chronic medical problems presenting with 1 week history of lower abdominal pain with associated nausea and vomiting.  The patient began developing abdominal pain around 08/03/2021.  He came to the emergency department on 08/06/2021.  CT of the abdomen and pelvis on that visit showed acute sigmoid diverticulitis without any abscess.  The patient was prescribed hydrocodone and Augmentin which she has been taking faithfully.  Unfortunately, the patient continued to have worsening lower abdominal pain with associated nausea and vomiting that developed on 08/09/2021.  He denied any chest pain, shortness breath, cough, hemoptysis, dysuria, hematuria, hematochezia, melena.  He has had some subjective fevers and chills. In the ED, the patient was afebrile and hemodynamically stable with oxygen saturation 100% room air.  WBC 8.7, hemoglobin 15.3, platelets 266,000.  Sodium 138, potassium 3.9, bicarbonate 25, serum creatinine 1.00.  LFTs were unremarkable.  CT of the abdomen and pelvis on 08/10/2021 showed persistent sigmoid diverticulitis with developing pericolonic abscess.  General surgery was consulted and requested admission for IV antibiotics and bowel rest.  The patient was started on IV Zosyn.  Scrotal ultrasound was negative for testicular torsion, masses.  There is small bilateral hydroceles.    Assessment and Plan: * Diverticulitis of colon with perforation Currently on clear liquids, advancing to full liquids Continue IVF Pain management changed from IV Dilaudid to oral oxycodone We will also added IV Toradol IV zosyn 8/1--discussed with IR>>too small and too deep for intervention General surgery following    Family Communication:  no Family at bedside  Consultants:  IR, general surgery  Code Status:  FULL   DVT  Prophylaxis: [place] Lovenox   Procedures: As Listed in Progress Note Above  Antibiotics: Zosyn 7/31>>     Subjective: Reports feeling nauseous today.  P.o. intake has been poor today.  Continues to have pain in left lower quadrant radiating to scrotum  Objective: Vitals:   08/11/21 1415 08/11/21 2040 08/12/21 0428 08/12/21 1409  BP: 124/85 122/81 117/72 114/88  Pulse: 65 67 (!) 57 74  Resp: 18 19 17 18   Temp: 98.3 F (36.8 C) 98.5 F (36.9 C) 98.3 F (36.8 C) 98.6 F (37 C)  TempSrc: Oral Oral Oral Oral  SpO2: 99% 97% 98% 100%  Weight:      Height:        Intake/Output Summary (Last 24 hours) at 08/12/2021 1730 Last data filed at 08/12/2021 1100 Gross per 24 hour  Intake 1946.82 ml  Output 2375 ml  Net -428.18 ml   Weight change:  Exam:  General:  Pt is alert, follows commands appropriately, not in acute distress HEENT: No icterus, No thrush, No neck mass, [place]/AT Cardiovascular: RRR, S1/S2, no rubs, no gallops Respiratory: CTA bilaterally, no wheezing, no crackles, no rhonchi Abdomen: Soft/+BS, tender in LLQ, non distended, no guarding Extremities: No edema, No lymphangitis, No petechiae, No rashes, no synovitis   Data Reviewed: I have personally reviewed following labs and imaging studies Basic Metabolic Panel: Recent Labs  Lab 08/05/21 1846 08/10/21 1155 08/11/21 1146  NA 136 138 136  K 3.6 3.9 3.7  CL 101 103 101  CO2 26 25 24   GLUCOSE 92 96 77  BUN 17 14 14   CREATININE 1.00 1.00 0.93  CALCIUM 9.5 9.5 8.7*   Liver Function Tests: Recent Labs  Lab  08/05/21 1846 08/10/21 1155  AST 18 18  ALT 19 18  ALKPHOS 78 75  BILITOT 0.9 0.8  PROT 8.6* 9.0*  ALBUMIN 4.7 4.5   Recent Labs  Lab 08/05/21 1846 08/10/21 1155  LIPASE 27 25   No results for input(s): "AMMONIA" in the last 168 hours. Coagulation Profile: No results for input(s): "INR", "PROTIME" in the last 168 hours. CBC: Recent Labs  Lab 08/05/21 1846 08/10/21 1155 08/11/21 0546  08/12/21 0548  WBC 9.7 8.7 7.4 4.4  HGB 15.1 15.3 13.5 12.3*  HCT 44.9 45.0 41.3 37.2*  MCV 91.4 91.3 92.6 91.4  PLT 291 266 239 274   Cardiac Enzymes: No results for input(s): "CKTOTAL", "CKMB", "CKMBINDEX", "TROPONINI" in the last 168 hours. BNP: Invalid input(s): "POCBNP" CBG: No results for input(s): "GLUCAP" in the last 168 hours. HbA1C: No results for input(s): "HGBA1C" in the last 72 hours. Urine analysis:    Component Value Date/Time   COLORURINE YELLOW 08/10/2021 1328   APPEARANCEUR CLEAR 08/10/2021 1328   LABSPEC 1.027 08/10/2021 1328   PHURINE 6.0 08/10/2021 1328   GLUCOSEU NEGATIVE 08/10/2021 1328   HGBUR NEGATIVE 08/10/2021 1328   BILIRUBINUR NEGATIVE 08/10/2021 1328   BILIRUBINUR negative 07/11/2020 1535   KETONESUR 80 (A) 08/10/2021 1328   PROTEINUR NEGATIVE 08/10/2021 1328   UROBILINOGEN 1.0 07/11/2020 1535   UROBILINOGEN 0.2 11/04/2014 1136   NITRITE NEGATIVE 08/10/2021 1328   LEUKOCYTESUR NEGATIVE 08/10/2021 1328   Sepsis Labs: @LABRCNTIP (procalcitonin:4,lacticidven:4) )No results found for this or any previous visit (from the past 240 hour(s)).   Scheduled Meds:  acetaminophen  1,000 mg Oral Q6H   enoxaparin (LOVENOX) injection  40 mg Subcutaneous Q24H   ketorolac  30 mg Intravenous Q6H   senna-docusate  2 tablet Oral QHS   Continuous Infusions:  lactated ringers 125 mL/hr at 08/12/21 0722   piperacillin-tazobactam (ZOSYN)  IV 3.375 g (08/12/21 1712)    Procedures/Studies: CT ABDOMEN PELVIS W CONTRAST  Result Date: 08/10/2021 CLINICAL DATA:  Nausea/vomiting Abdominal pain, acute, nonlocalized EXAM: CT ABDOMEN AND PELVIS WITH CONTRAST TECHNIQUE: Multidetector CT imaging of the abdomen and pelvis was performed using the standard protocol following bolus administration of intravenous contrast. RADIATION DOSE REDUCTION: This exam was performed according to the departmental dose-optimization program which includes automated exposure control,  adjustment of the mA and/or kV according to patient size and/or use of iterative reconstruction technique. CONTRAST:  08/12/2021 OMNIPAQUE IOHEXOL 300 MG/ML  SOLN COMPARISON:  CT 08/06/2021 FINDINGS: Lower chest: No acute abnormality. Hepatobiliary: No focal liver abnormality is seen. The gallbladder is unremarkable. Pancreas: Unremarkable. No pancreatic ductal dilatation or surrounding inflammatory changes. Spleen: Normal in size without focal abnormality. Small adjacent splenule. Adrenals/Urinary Tract: Adrenal glands are unremarkable. No hydronephrosis. There is a 2 mm nonobstructive right lower pole renal stone. The bladder is minimally distended. Stomach/Bowel: There is persistent wall thickening and adjacent inflammatory stranding related to sigmoid diverticulitis. There is pericolic phlegmon/abscess measuring 2.8 x 2.2 x 3.0 cm (series 2 image 63, series 5 image 55).The appendix is normal. Vascular/Lymphatic: No significant vascular findings are present. No enlarged abdominal or pelvic lymph nodes. Reproductive: Unremarkable. Other: Trace ascites.  No hernia.  No free air. Musculoskeletal: No acute or significant osseous findings. IMPRESSION: Persistent acute sigmoid diverticulitis, with developing pericolic phlegmon/abscess measuring 2.8 x 2.2 x 3.0 cm. Electronically Signed   By: 08/08/2021 M.D.   On: 08/10/2021 14:48   08/12/2021 SCROTUM W/DOPPLER  Result Date: 08/10/2021 CLINICAL DATA:  Bilateral testicle pain EXAM: SCROTAL ULTRASOUND  DOPPLER ULTRASOUND OF THE TESTICLES TECHNIQUE: Complete ultrasound examination of the testicles, epididymis, and other scrotal structures was performed. Color and spectral Doppler ultrasound were also utilized to evaluate blood flow to the testicles. COMPARISON:  None Available. FINDINGS: Right testicle Measurements: 5.1 x 1.9 x 3.5 cm (volume = 18 cm^3). No mass or microlithiasis visualized. Left testicle Measurements: 3.2 x 3.1 x 4.4 cm (volume = 23 cm^3). No mass or  microlithiasis visualized. Right epididymis:  Normal in size and appearance. Left epididymis:  Normal in size and appearance. Hydrocele:  Small bilateral hydroceles. Varicocele:  None visualized. Pulsed Doppler interrogation of both testes demonstrates normal low resistance arterial and venous waveforms bilaterally. IMPRESSION: 1. No testicular mass or torsion identified. 2. Small bilateral hydroceles. Electronically Signed   By: Signa Kell M.D.   On: 08/10/2021 14:18   CT ABDOMEN PELVIS W CONTRAST  Result Date: 08/06/2021 CLINICAL DATA:  Abdominal pain EXAM: CT ABDOMEN AND PELVIS WITH CONTRAST TECHNIQUE: Multidetector CT imaging of the abdomen and pelvis was performed using the standard protocol following bolus administration of intravenous contrast. RADIATION DOSE REDUCTION: This exam was performed according to the departmental dose-optimization program which includes automated exposure control, adjustment of the mA and/or kV according to patient size and/or use of iterative reconstruction technique. CONTRAST:  OMNIPAQUE IOHEXOL 300 MG/ML  SOLN COMPARISON:  07/15/2020 FINDINGS: Lower chest: Lung bases are clear. Hepatobiliary: Liver is within normal limits. Gallbladder is unremarkable. No intrahepatic or extrahepatic ductal dilatation. Pancreas: Within normal limits. Spleen: Within normal limits. Adrenals/Urinary Tract: Adrenal glands are within normal limits. Kidneys are within normal limits.  No hydronephrosis. Bladder is within normal limits. Stomach/Bowel: Stomach is within normal limits. No evidence of bowel obstruction. Normal appendix (series 2/image 49). Sigmoid diverticulitis with pericolonic inflammatory changes/stranding in the left lower quadrant (series 2/image 86), reflecting acute diverticulitis. No drainable fluid collection/abscess. No free air to suggest macroscopic perforation. Vascular/Lymphatic: No evidence of abdominal aortic aneurysm. No suspicious abdominopelvic  lymphadenopathy. Reproductive: Prostate is unremarkable. Other: Small volume pelvic ascites. Musculoskeletal: Visualized osseous structures are within normal limits. IMPRESSION: Acute sigmoid diverticulitis. No drainable fluid collection/abscess. No free air to suggest macroscopic perforation. Electronically Signed   By: Charline Bills M.D.   On: 08/06/2021 03:42    Erick Blinks, MD  [facility]  If 7PM-7AM, please contact night-coverage [email or web address]  08/12/2021, 5:30 PM   LOS: 2 days

## 2021-08-12 NOTE — Progress Notes (Signed)
[facility] Progress Note     Subjective: Patient seen and examined.  He is resting comfortably in bed. He continues to complain of suprapubic and left lower quadrant abdominal pain.  He was able to tolerate a clear liquid diet without nausea and vomiting.  He last passed flatus yesterday, and maybe had a small bowel movement earlier this morning.  He continues to have pain radiating into his left scrotum.  Objective: Vital signs in last 24 hours: Temp:  [98.3 F (36.8 C)-98.5 F (36.9 C)] 98.3 F (36.8 C) (08/02 0428) Pulse Rate:  [57-67] 57 (08/02 0428) Resp:  [17-19] 17 (08/02 0428) BP: (117-124)/(72-85) 117/72 (08/02 0428) SpO2:  [97 %-99 %] 98 % (08/02 0428) Last BM Date : 08/04/21  Intake/Output from previous day: 08/01 0701 - 08/02 0700 In: 3846.4 [P.O.:480; I.V.:3255.7; IV Piggyback:110.7] Out: 1525 [Urine:1525] Intake/Output this shift: Total I/O In: -  Out: 450 [Urine:450]  General appearance: alert, cooperative, and no distress GI: Soft, nondistended, no percussion tenderness, tenderness to palpation in the suprapubic and left lower quadrant; no rigidity, guarding, rebound tenderness  Lab Results:  Recent Labs    08/11/21 0546 08/12/21 0548  WBC 7.4 4.4  HGB 13.5 12.3*  HCT 41.3 37.2*  PLT 239 274   BMET Recent Labs    08/10/21 1155 08/11/21 1146  NA 138 136  K 3.9 3.7  CL 103 101  CO2 25 24  GLUCOSE 96 77  BUN 14 14  CREATININE 1.00 0.93  CALCIUM 9.5 8.7*   PT/INR No results for input(s): "LABPROT", "INR" in the last 72 hours.  Studies/Results: CT ABDOMEN PELVIS W CONTRAST  Result Date: 08/10/2021 CLINICAL DATA:  Nausea/vomiting Abdominal pain, acute, nonlocalized EXAM: CT ABDOMEN AND PELVIS WITH CONTRAST TECHNIQUE: Multidetector CT imaging of the abdomen and pelvis was performed using the standard protocol following bolus administration of intravenous contrast. RADIATION DOSE REDUCTION: This exam was performed according to  the departmental dose-optimization program which includes automated exposure control, adjustment of the mA and/or kV according to patient size and/or use of iterative reconstruction technique. CONTRAST:  OMNIPAQUE IOHEXOL 300 MG/ML  SOLN COMPARISON:  CT 08/06/2021 FINDINGS: Lower chest: No acute abnormality. Hepatobiliary: No focal liver abnormality is seen. The gallbladder is unremarkable. Pancreas: Unremarkable. No pancreatic ductal dilatation or surrounding inflammatory changes. Spleen: Normal in size without focal abnormality. Small adjacent splenule. Adrenals/Urinary Tract: Adrenal glands are unremarkable. No hydronephrosis. There is a 2 mm nonobstructive right lower pole renal stone. The bladder is minimally distended. Stomach/Bowel: There is persistent wall thickening and adjacent inflammatory stranding related to sigmoid diverticulitis. There is pericolic phlegmon/abscess measuring 2.8 x 2.2 x 3.0 cm (series 2 image 63, series 5 image 55).The appendix is normal. Vascular/Lymphatic: No significant vascular findings are present. No enlarged abdominal or pelvic lymph nodes. Reproductive: Unremarkable. Other: Trace ascites.  No hernia.  No free air. Musculoskeletal: No acute or significant osseous findings. IMPRESSION: Persistent acute sigmoid diverticulitis, with developing pericolic phlegmon/abscess measuring 2.8 x 2.2 x 3.0 cm. Electronically Signed   By: Caprice Renshaw M.D.   On: 08/10/2021 14:48   US SCROTUM W/DOPPLER  Result Date: 08/10/2021 CLINICAL DATA:  Bilateral testicle pain EXAM: SCROTAL ULTRASOUND DOPPLER ULTRASOUND OF THE TESTICLES TECHNIQUE: Complete ultrasound examination of the testicles, epididymis, and other scrotal structures was performed. Color and spectral Doppler ultrasound were also utilized to evaluate blood flow to the testicles. COMPARISON:  None Available. FINDINGS: Right testicle Measurements: 5.1 x 1.9 x 3.5 cm (volume = 18 cm^3).  No mass or microlithiasis visualized. Left  testicle Measurements: 3.2 x 3.1 x 4.4 cm (volume = 23 cm^3). No mass or microlithiasis visualized. Right epididymis:  Normal in size and appearance. Left epididymis:  Normal in size and appearance. Hydrocele:  Small bilateral hydroceles. Varicocele:  None visualized. Pulsed Doppler interrogation of both testes demonstrates normal low resistance arterial and venous waveforms bilaterally. IMPRESSION: 1. No testicular mass or torsion identified. 2. Small bilateral hydroceles. Electronically Signed   By: Signa Kell M.D.   On: 08/10/2021 14:18    Anti-infectives: Anti-infectives (From admission, onward)    Start     Dose/Rate Route Frequency Ordered Stop   08/11/21 1000  piperacillin-tazobactam (ZOSYN) IVPB 3.375 g        3.375 g 12.5 mL/hr over 240 Minutes Intravenous Every 8 hours 08/11/21 0725     08/11/21 0000  piperacillin-tazobactam (ZOSYN) IVPB 3.375 g  Status:  Discontinued        3.375 g 12.5 mL/hr over 240 Minutes Intravenous Every 8 hours 08/10/21 1756 08/11/21 0725   08/10/21 1630  piperacillin-tazobactam (ZOSYN) IVPB 3.375 g        3.375 g 100 mL/hr over 30 Minutes Intravenous  Once 08/10/21 1625 08/10/21 1743       Assessment/Plan:  Patient is a 26 y.o. male who was admitted with persistent acute sigmoid diverticulitis, and possible new formation of pericolic abscess.    -Advance to full liquid diet, okay for soft diet this evening if tolerates full liquids -No window for drainage per IR, recommend IV antibiotics and conservative management -Consider repeat CT scan if patient does not clinically improve -Patient still without leukocytosis -Continue IV Zosyn -IV fluids per primary team -Scheduled Tylenol and as needed Roxicodone ordered, Dilaudid discontinued, as he will not be able to be discharged home with IV pain medications -Senokot-S ordered for feelings of bloating -No acute surgical intervention at this time -Care per hospitalist   LOS: 2 days    Cullen Vanallen A  Kairon Shock 08/12/2021

## 2021-08-13 DIAGNOSIS — K5720 Diverticulitis of large intestine with perforation and abscess without bleeding: Secondary | ICD-10-CM | POA: Diagnosis not present

## 2021-08-13 LAB — CBC
HCT: 40.5 % (ref 39.0–52.0)
Hemoglobin: 13.6 g/dL (ref 13.0–17.0)
MCH: 30.4 pg (ref 26.0–34.0)
MCHC: 33.6 g/dL (ref 30.0–36.0)
MCV: 90.6 fL (ref 80.0–100.0)
Platelets: 333 10*3/uL (ref 150–400)
RBC: 4.47 MIL/uL (ref 4.22–5.81)
RDW: 11.4 % — ABNORMAL LOW (ref 11.5–15.5)
WBC: 4 10*3/uL (ref 4.0–10.5)
nRBC: 0 % (ref 0.0–0.2)

## 2021-08-13 LAB — PHOSPHORUS: Phosphorus: 4.9 mg/dL — ABNORMAL HIGH (ref 2.5–4.6)

## 2021-08-13 LAB — BASIC METABOLIC PANEL
Anion gap: 8 (ref 5–15)
BUN: <5 mg/dL — ABNORMAL LOW (ref 6–20)
CO2: 27 mmol/L (ref 22–32)
Calcium: 9 mg/dL (ref 8.9–10.3)
Chloride: 104 mmol/L (ref 98–111)
Creatinine, Ser: 1.07 mg/dL (ref 0.61–1.24)
GFR, Estimated: >60 mL/min (ref >60)
Glucose, Bld: 93 mg/dL (ref 70–99)
Potassium: 3.3 mmol/L — ABNORMAL LOW (ref 3.5–5.1)
Sodium: 139 mmol/L (ref 135–145)

## 2021-08-13 LAB — MAGNESIUM: Magnesium: 2.2 mg/dL (ref 1.7–2.4)

## 2021-08-13 MED ORDER — METRONIDAZOLE 500 MG PO TABS: 500.0000 mg | ORAL_TABLET | Freq: Three times a day (TID) | ORAL | 0 refills | Status: AC

## 2021-08-13 MED ORDER — HYDROCODONE-ACETAMINOPHEN 5-325 MG PO TABS: 1.0000 | ORAL_TABLET | Freq: Four times a day (QID) | ORAL | 0 refills | Status: DC | PRN

## 2021-08-13 MED ORDER — ACETAMINOPHEN 500 MG PO TABS: 1000.0000 mg | ORAL_TABLET | Freq: Four times a day (QID) | ORAL | 0 refills | Status: DC

## 2021-08-13 MED ORDER — AMOXICILLIN-POT CLAVULANATE 875-125 MG PO TABS: 1.0000 | ORAL_TABLET | Freq: Two times a day (BID) | ORAL | 0 refills | Status: DC

## 2021-08-13 NOTE — Discharge Summary (Signed)
[facility] Discharge Summary   Patient: Thomas Valdez JXB:[id number]  PCP: Daryll Drown, NP  Date of admission: 08/10/2021   Date of discharge:  08/13/2021     Discharge Diagnoses:  Principal Problem:   Diverticulitis of colon with perforation   Admitted From: Home Disposition:  Home  Recommendations for Outpatient Follow-up:  PCP: in 1 wk Gen Surgery in 1 wk Gi in 1-2 weeks for colonoscopy in 4 to 8 weeks Follow up LABS/TEST:     Diet recommendation: low fiber soft diet  Activity: The patient is advised to gradually reintroduce usual activities, as tolerated  Discharge Condition: stable  Code Status: Full code   History of present illness: As per the H and P dictated on admission  Hospital Course:  26 year old male with no documented chronic medical problems presenting with 1 week history of lower abdominal pain with associated nausea and vomiting.  The patient began developing abdominal pain around 08/03/2021.  He came to the emergency department on 08/06/2021.  CT of the abdomen and pelvis on that visit showed acute sigmoid diverticulitis without any abscess.  The patient was prescribed hydrocodone and Augmentin which she has been taking faithfully.  Unfortunately, the patient continued to have worsening lower abdominal pain with associated nausea and vomiting that developed on 08/09/2021.  He denied any chest pain, shortness breath, cough, hemoptysis, dysuria, hematuria, hematochezia, melena.  He has had some subjective fevers and chills. In the ED, the patient was afebrile and hemodynamically stable with oxygen saturation 100% room air.  WBC 8.7, hemoglobin 15.3, platelets 266,000.  Sodium 138, potassium 3.9, bicarbonate 25, serum creatinine 1.00.  LFTs were unremarkable.  CT of the abdomen and pelvis on 08/10/2021 showed persistent sigmoid diverticulitis with developing pericolonic abscess.  General surgery was consulted and requested admission for IV antibiotics and  bowel rest.  The patient was started on IV Zosyn. Scrotal ultrasound was negative for testicular torsion, masses.  There is small bilateral hydroceles.   Assessment and Plan: # Diverticulitis of colon with perforation S/p clear liquids, advancing to full liquids and then patient was given soft diet which he tolerated well. S/p IVF, Pain management changed from IV Dilaudid to oral oxycodone, added IV Toradol. S/p IV zosyn, On 8/1--discussed with IR>>too small and too deep for intervention. General surgery was consulted, recommended no intervention.  Patient was cleared by general surgery to discharge on oral antibiotics Augmentin twice daily for 7 additional days.  Percocet was refilled by general surgery for pain control.  Patient was advised to follow-up with GI as an outpatient for colonoscopy in 6 to 8 weeks.  Body mass index is 25.55 kg/m.  Nutrition Interventions:   Pain control  Percocet was refilled by general surgery  - Patient was instructed, not to drive, operate heavy machinery, perform activities at heights, swimming or participation in water activities or provide baby sitting services while on Pain, Sleep and Anxiety Medications; until his outpatient Physician has advised to do so again.  - Also recommended to not to take more than prescribed Pain, Sleep and Anxiety Medications.  Patient was ambulatory without any assistance. On the day of the discharge the patient's vitals were stable, and no other acute medical condition were reported by patient. the patient was felt safe to be discharge at Home.  Consultants: General surgery and IR Procedures: None  Discharge Exam: General: Appear in no distress, no Rash; Oral Mucosa Clear, moist. Cardiovascular: S1 and S2 Present, no Murmur, Respiratory: normal respiratory effort, Bilateral  Air entry present and no Crackles, no wheezes Abdomen: Bowel Sound present, Soft and mild LLQ tenderness, no hernia Extremities: no Pedal edema, no  calf tenderness Neurology: alert and oriented to time, place, and person affect appropriate.  Filed Weights   08/10/21 1736  Weight: 83.1 kg   Vitals:   08/13/21 0423 08/13/21 1402  BP: 116/80 128/82  Pulse: 61 68  Resp: 16 (!) 22  Temp: 97.9 F (36.6 C) 98.3 F (36.8 C)  SpO2: 99% 100%    DISCHARGE MEDICATION: Allergies as of 08/13/2021   No Known Allergies      Medication List     STOP taking these medications    valACYclovir 1000 MG tablet Commonly known as: VALTREX       TAKE these medications    acetaminophen 500 MG tablet Commonly known as: TYLENOL Take 2 tablets (1,000 mg total) by mouth every 6 (six) hours.   amoxicillin-clavulanate 875-125 MG tablet Commonly known as: AUGMENTIN Take 1 tablet by mouth every 12 (twelve) hours.   HYDROcodone-acetaminophen 5-325 MG tablet Commonly known as: NORCO/VICODIN Take 1 tablet by mouth every 6 (six) hours as needed for severe pain.   metroNIDAZOLE 500 MG tablet Commonly known as: Flagyl Take 1 tablet (500 mg total) by mouth 3 (three) times daily for 7 days.   traZODone 50 MG tablet Commonly known as: DESYREL Take 50 mg by mouth at bedtime as needed for sleep. What changed: Another medication with the same name was removed. Continue taking this medication, and follow the directions you see here.       No Known Allergies Discharge Instructions     Call MD for:  difficulty breathing, headache or visual disturbances   Complete by: As directed    Call MD for:  extreme fatigue   Complete by: As directed    Call MD for:  persistant dizziness or light-headedness   Complete by: As directed    Call MD for:  persistant nausea and vomiting   Complete by: As directed    Call MD for:  severe uncontrolled pain   Complete by: As directed    Call MD for:  temperature >100.4   Complete by: As directed    Diet - low sodium heart healthy   Complete by: As directed    Discharge instructions   Complete by: As  directed    Follow-up with PCP in 1 week Follow-up with GI in 1 to 2 weeks to schedule colonoscopy as an outpatient in 4 to 6 weeks Follow with general surgery in 1 to 2 weeks   Increase activity slowly   Complete by: As directed        The results of significant diagnostics from this hospitalization (including imaging, microbiology, ancillary and laboratory) are listed below for reference.    Significant Diagnostic Studies: CT ABDOMEN PELVIS W CONTRAST  Result Date: 08/10/2021 CLINICAL DATA:  Nausea/vomiting Abdominal pain, acute, nonlocalized EXAM: CT ABDOMEN AND PELVIS WITH CONTRAST TECHNIQUE: Multidetector CT imaging of the abdomen and pelvis was performed using the standard protocol following bolus administration of intravenous contrast. RADIATION DOSE REDUCTION: This exam was performed according to the departmental dose-optimization program which includes automated exposure control, adjustment of the mA and/or kV according to patient size and/or use of iterative reconstruction technique. CONTRAST:  OMNIPAQUE IOHEXOL 300 MG/ML  SOLN COMPARISON:  CT 08/06/2021 FINDINGS: Lower chest: No acute abnormality. Hepatobiliary: No focal liver abnormality is seen. The gallbladder is unremarkable. Pancreas: Unremarkable. No pancreatic ductal dilatation  or surrounding inflammatory changes. Spleen: Normal in size without focal abnormality. Small adjacent splenule. Adrenals/Urinary Tract: Adrenal glands are unremarkable. No hydronephrosis. There is a 2 mm nonobstructive right lower pole renal stone. The bladder is minimally distended. Stomach/Bowel: There is persistent wall thickening and adjacent inflammatory stranding related to sigmoid diverticulitis. There is pericolic phlegmon/abscess measuring 2.8 x 2.2 x 3.0 cm (series 2 image 63, series 5 image 55).The appendix is normal. Vascular/Lymphatic: No significant vascular findings are present. No enlarged abdominal or pelvic lymph nodes. Reproductive:  Unremarkable. Other: Trace ascites.  No hernia.  No free air. Musculoskeletal: No acute or significant osseous findings. IMPRESSION: Persistent acute sigmoid diverticulitis, with developing pericolic phlegmon/abscess measuring 2.8 x 2.2 x 3.0 cm. Electronically Signed   By: Caprice Renshaw M.D.   On: 08/10/2021 14:48   US SCROTUM W/DOPPLER  Result Date: 08/10/2021 CLINICAL DATA:  Bilateral testicle pain EXAM: SCROTAL ULTRASOUND DOPPLER ULTRASOUND OF THE TESTICLES TECHNIQUE: Complete ultrasound examination of the testicles, epididymis, and other scrotal structures was performed. Color and spectral Doppler ultrasound were also utilized to evaluate blood flow to the testicles. COMPARISON:  None Available. FINDINGS: Right testicle Measurements: 5.1 x 1.9 x 3.5 cm (volume = 18 cm^3). No mass or microlithiasis visualized. Left testicle Measurements: 3.2 x 3.1 x 4.4 cm (volume = 23 cm^3). No mass or microlithiasis visualized. Right epididymis:  Normal in size and appearance. Left epididymis:  Normal in size and appearance. Hydrocele:  Small bilateral hydroceles. Varicocele:  None visualized. Pulsed Doppler interrogation of both testes demonstrates normal low resistance arterial and venous waveforms bilaterally. IMPRESSION: 1. No testicular mass or torsion identified. 2. Small bilateral hydroceles. Electronically Signed   By: Signa Kell M.D.   On: 08/10/2021 14:18   CT ABDOMEN PELVIS W CONTRAST  Result Date: 08/06/2021 CLINICAL DATA:  Abdominal pain EXAM: CT ABDOMEN AND PELVIS WITH CONTRAST TECHNIQUE: Multidetector CT imaging of the abdomen and pelvis was performed using the standard protocol following bolus administration of intravenous contrast. RADIATION DOSE REDUCTION: This exam was performed according to the departmental dose-optimization program which includes automated exposure control, adjustment of the mA and/or kV according to patient size and/or use of iterative reconstruction technique. CONTRAST:   OMNIPAQUE IOHEXOL 300 MG/ML  SOLN COMPARISON:  07/15/2020 FINDINGS: Lower chest: Lung bases are clear. Hepatobiliary: Liver is within normal limits. Gallbladder is unremarkable. No intrahepatic or extrahepatic ductal dilatation. Pancreas: Within normal limits. Spleen: Within normal limits. Adrenals/Urinary Tract: Adrenal glands are within normal limits. Kidneys are within normal limits.  No hydronephrosis. Bladder is within normal limits. Stomach/Bowel: Stomach is within normal limits. No evidence of bowel obstruction. Normal appendix (series 2/image 49). Sigmoid diverticulitis with pericolonic inflammatory changes/stranding in the left lower quadrant (series 2/image 86), reflecting acute diverticulitis. No drainable fluid collection/abscess. No free air to suggest macroscopic perforation. Vascular/Lymphatic: No evidence of abdominal aortic aneurysm. No suspicious abdominopelvic lymphadenopathy. Reproductive: Prostate is unremarkable. Other: Small volume pelvic ascites. Musculoskeletal: Visualized osseous structures are within normal limits. IMPRESSION: Acute sigmoid diverticulitis. No drainable fluid collection/abscess. No free air to suggest macroscopic perforation. Electronically Signed   By: Charline Bills M.D.   On: 08/06/2021 03:42    Microbiology: No results found for this or any previous visit (from the past 240 hour(s)).   Labs: CBC: Recent Labs  Lab 08/10/21 1155 08/11/21 0546 08/12/21 0548 08/13/21 0440  WBC 8.7 7.4 4.4 4.0  HGB 15.3 13.5 12.3* 13.6  HCT 45.0 41.3 37.2* 40.5  MCV 91.3 92.6 91.4 90.6  PLT  266 239 274 333   Basic Metabolic Panel: Recent Labs  Lab 08/10/21 1155 08/11/21 1146 08/13/21 0440  NA 138 136 139  K 3.9 3.7 3.3*  CL 103 101 104  CO2 25 24 27   GLUCOSE 96 77 93  BUN 14 14 <5*  CREATININE 1.00 0.93 1.07  CALCIUM 9.5 8.7* 9.0   Liver Function Tests: Recent Labs  Lab 08/10/21 1155  AST 18  ALT 18  ALKPHOS 75  BILITOT 0.8  PROT 9.0*  ALBUMIN  4.5   Recent Labs  Lab 08/10/21 1155  LIPASE 25   No results for input(s): "AMMONIA" in the last 168 hours. Cardiac Enzymes: No results for input(s): "CKTOTAL", "CKMB", "CKMBINDEX", "TROPONINI" in the last 168 hours. BNP (last 3 results) No results for input(s): "BNP" in the last 8760 hours. CBG: No results for input(s): "GLUCAP" in the last 168 hours.  Time spent: 35 minutes  Signed:  08/12/21  [facility]  08/13/2021 2:37 PM

## 2021-08-13 NOTE — Plan of Care (Signed)

## 2021-08-13 NOTE — Progress Notes (Signed)
[facility] Progress Note     Subjective: Patient seen and examined.  He is resting comfortably in bed.  His pain is significantly improved from yesterday, and he feels his pain is better controlled on this new pain regimen.  He was able to tolerate his full liquid diet without nausea and vomiting.  He did have a liquid bowel movement overnight.  Objective: Vital signs in last 24 hours: Temp:  [97.9 F (36.6 C)-98.6 F (37 C)] 97.9 F (36.6 C) (08/03 0423) Pulse Rate:  [61-74] 61 (08/03 0423) Resp:  [16-18] 16 (08/03 0423) BP: (114-119)/(73-88) 116/80 (08/03 0423) SpO2:  [99 %-100 %] 99 % (08/03 0423) Last BM Date : 08/12/21  Intake/Output from previous day: 08/02 0701 - 08/03 0700 In: 2515.7 [P.O.:1160; I.V.:1166.4; IV Piggyback:189.3] Out: 1725 [Urine:1725] Intake/Output this shift: Total I/O In: 120 [P.O.:120] Out: -   General appearance: alert, cooperative, and no distress GI: Abdomen soft, nondistended, no percussion tenderness, minimal tenderness to palpation in the suprapubic and left lower quadrant; no rigidity, guarding, rebound tenderness  Lab Results:  Recent Labs    08/12/21 0548 08/13/21 0440  WBC 4.4 4.0  HGB 12.3* 13.6  HCT 37.2* 40.5  PLT 274 333   BMET Recent Labs    08/11/21 1146 08/13/21 0440  NA 136 139  K 3.7 3.3*  CL 101 104  CO2 24 27  GLUCOSE 77 93  BUN 14 <5*  CREATININE 0.93 1.07  CALCIUM 8.7* 9.0   PT/INR No results for input(s): "LABPROT", "INR" in the last 72 hours.  Studies/Results: No results found.  Anti-infectives: Anti-infectives (From admission, onward)    Start     Dose/Rate Route Frequency Ordered Stop   08/11/21 1000  piperacillin-tazobactam (ZOSYN) IVPB 3.375 g        3.375 g 12.5 mL/hr over 240 Minutes Intravenous Every 8 hours 08/11/21 0725     08/11/21 0000  piperacillin-tazobactam (ZOSYN) IVPB 3.375 g  Status:  Discontinued        3.375 g 12.5 mL/hr over 240 Minutes Intravenous Every 8  hours 08/10/21 1756 08/11/21 0725   08/10/21 1630  piperacillin-tazobactam (ZOSYN) IVPB 3.375 g        3.375 g 100 mL/hr over 30 Minutes Intravenous  Once 08/10/21 1625 08/10/21 1743       Assessment/Plan:  Patient is a 26 year old male who was admitted with persistent acute sigmoid diverticulitis and new formation of pericolic abscess  -GI soft diet -No window for drainage per IR, recommending conservative management -No leukocytosis -Continue IV Zosyn -PRN pain control and antiemetics -No acute surgical intervention at this time -If patient tolerates soft diet, he is stable for discharge from a general surgery standpoint -Patient plans to call GI to schedule outpatient colonoscopy, will need colonoscopy in 6 to 8 weeks -Patient will need to be discharged home with antibiotics.  Would likely recommend discharge home with Augmentin for an additional 7 days, but will defer to hospitalist -Also recommend discharge home with prescription for Roxicodone to be taken as needed for pain.  Advised patient he can also take Tylenol and Advil/Motrin   LOS: 3 days    Hobie Kohles A Rosalia Mcavoy 08/13/2021

## 2021-08-19 ENCOUNTER — Encounter: Payer: Self-pay | Admitting: Nurse Practitioner

## 2021-08-19 ENCOUNTER — Other Ambulatory Visit: Payer: Self-pay

## 2021-08-19 ENCOUNTER — Ambulatory Visit (INDEPENDENT_AMBULATORY_CARE_PROVIDER_SITE_OTHER): Payer: No Typology Code available for payment source | Admitting: Nurse Practitioner

## 2021-08-19 VITALS — BP 114/77 | HR 67 | Temp 99.1°F | Ht 71.0 in | Wt 183.6 lb

## 2021-08-19 DIAGNOSIS — R110 Nausea: Secondary | ICD-10-CM

## 2021-08-19 MED ORDER — ONDANSETRON HCL 4 MG PO TABS: 4.0000 mg | ORAL_TABLET | Freq: Three times a day (TID) | ORAL | 0 refills | Status: DC | PRN

## 2021-08-19 MED ORDER — ONDANSETRON HCL 4 MG PO TABS
4.0000 mg | ORAL_TABLET | Freq: Three times a day (TID) | ORAL | 0 refills | Status: DC | PRN
  Filled 2021-08-19: qty 20, 7d supply, fill #0

## 2021-08-19 NOTE — Patient Instructions (Signed)
Diverticulitis  Diverticulitis is when small pouches in your colon (large intestine) get infected or swollen. This causes pain in the belly (abdomen) and watery poop (diarrhea). These pouches are called diverticula. The pouches form in people who have a condition called diverticulosis. What are the causes? This condition may be caused by poop (stool) that gets trapped in the pouches in your colon. The poop lets germs (bacteria) grow in the pouches. This causes the infection. What increases the risk? You are more likely to get this condition if you have small pouches in your colon. The risk is higher if: You are overweight or very overweight (obese). You do not exercise enough. You drink alcohol. You smoke or use products with tobacco in them. You eat a diet that has a lot of red meat such as beef, pork, or lamb. You eat a diet that does not have enough fiber in it. You are older than 27 years of age. What are the signs or symptoms? Pain in the belly. Pain is often on the left side, but it may be in other areas. Fever and feeling cold. Feeling like you may vomit. Vomiting. Having cramps. Feeling full. Changes to how often you poop. Blood in your poop. How is this treated? Most cases are treated at home by: Taking over-the-counter pain medicines. Following a clear liquid diet. Taking antibiotic medicines. Resting. Very bad cases may need to be treated at a hospital. This may include: Not eating or drinking. Taking prescription pain medicine. Getting antibiotic medicines through an IV tube. Getting fluid and food through an IV tube. Having surgery. When you are feeling better, your doctor may tell you to have a test to check your colon (colonoscopy). Follow these instructions at home: Medicines Take over-the-counter and prescription medicines only as told by your doctor. These include: Antibiotics. Pain medicines. Fiber pills. Probiotics. Stool softeners. If you were  prescribed an antibiotic medicine, take it as told by your doctor. Do not stop taking the antibiotic even if you start to feel better. Ask your doctor if the medicine prescribed to you requires you to avoid driving or using machinery. Eating and drinking  Follow a diet as told by your doctor. When you feel better, your doctor may tell you to change your diet. You may need to eat a lot of fiber. Fiber makes it easier to poop (have a bowel movement). Foods with fiber include: Berries. Beans. Lentils. Green vegetables. Avoid eating red meat. General instructions Do not use any products that contain nicotine or tobacco, such as cigarettes, e-cigarettes, and chewing tobacco. If you need help quitting, ask your doctor. Exercise 3 or more times a week. Try to get 30 minutes each time. Exercise enough to sweat and make your heart beat faster. Keep all follow-up visits as told by your doctor. This is important. Contact a doctor if: Your pain does not get better. You are not pooping like normal. Get help right away if: Your pain gets worse. Your symptoms do not get better. Your symptoms get worse very fast. You have a fever. You vomit more than one time. You have poop that is: Bloody. Black. Tarry. Summary This condition happens when small pouches in your colon get infected or swollen. Take medicines only as told by your doctor. Follow a diet as told by your doctor. Keep all follow-up visits as told by your doctor. This is important. This information is not intended to replace advice given to you by your health care provider. Make sure   you discuss any questions you have with your health care provider. Document Revised: 10/09/2018 Document Reviewed: 10/09/2018 Elsevier Patient Education  2023 [employer]  

## 2021-08-19 NOTE — Progress Notes (Signed)
Established Patient Office Visit  Subjective   Patient ID: Thomas Valdez, male    DOB: 07/02/1995  Age: 26 y.o. MRN: [id number]  Chief Complaint  Patient presents with   Hospitalization Follow-up    HPI  Patient is a 26 year old male who presented to the emergency department with 1 week lower abdominal pain associated with nausea and vomiting.  After CT of the abdomen was completed showed acute sigmoid diverticulitis without any abscess patient was treated with antibiotic and pain medicine and was released but that did not resolve patient's symptoms.  Patient later developed fever and chills and return to the emergency department.  Repeat CT showed persistent sigmoid diverticulitis with pericolonic abscess.  General surgery was consulted and requested admission for IV antibiotics and bowel rest.  Patient was treated with IV Zosyn, Augmentin for 7 days pain management and follow-up with GI outpatient for colonoscopy in 6 to 8 weeks. Completed hospital discharge follow-up.  Patient reports the symptoms have resolved except for mild nausea.  Patient is afebrile.  Completed hospital discharge instructions with follow-up appointments.  Patient verbalized understanding.    Today's visit was for Transitional Care Management.  The patient was discharged from [facility] on [facility]/13/2021 with a primary diagnosis of diverticulitis of colon with perforation.   Contact with the patient and/or caregiver, by a clinical staff member, and was documented as a telephone encounter within the EMR.  Through chart review and discussion with the patient I have determined that management of their condition is of moderate complexity.      Patient Active Problem List   Diagnosis Date Noted   Diverticulitis of colon with perforation 08/10/2021   Humeral head fracture 06/11/2021   Establishing care with new doctor, encounter for 01/21/2021   Annual physical exam 01/21/2021   Past Medical History:   Diagnosis Date   Diverticulitis    History reviewed. No pertinent surgical history. Social History   Tobacco Use   Smoking status: Never   Smokeless tobacco: Never  Vaping Use   Vaping Use: Never used  Substance Use Topics   Alcohol use: Yes    Comment: social   Drug use: No   Social History   Socioeconomic History   Marital status: Single    Spouse name: Not on file   Number of children: Not on file   Years of education: Not on file   Highest education level: Not on file  Occupational History   Not on file  Tobacco Use   Smoking status: Never   Smokeless tobacco: Never  Vaping Use   Vaping Use: Never used  Substance and Sexual Activity   Alcohol use: Yes    Comment: social   Drug use: No   Sexual activity: Not on file  Other Topics Concern   Not on file  Social History Narrative   Not on file   Social Determinants of Health   Financial Resource Strain: Not on file  Food Insecurity: Not on file  Transportation Needs: Not on file  Physical Activity: Not on file  Stress: Not on file  Social Connections: Not on file  Intimate Partner Violence: Not on file   Family Status  Relation Name Status   Mother  Alive   Father  Alive   Sister  Alive   Brother  Alive   Brother  Alive   Brother  Alive   Brother  Alive   Brother  Alive   Brother  Alive   [place]  Deceased   MGF  Alive   PGM  Deceased   PGF  Deceased   Family History  Problem Relation Age of Onset   Cancer Father        prostate   Heart disease Maternal Grandmother       Review of Systems  Constitutional: Negative.   HENT: Negative.    Respiratory: Negative.    Cardiovascular: Negative.   Gastrointestinal:  Positive for nausea.  Genitourinary: Negative.   Musculoskeletal: Negative.   Skin: Negative.  Negative for itching and rash.  All other systems reviewed and are negative.     Objective:     BP 114/77   Pulse 67   Temp 99.1 F (37.3 C)   Ht 5\' 11"  (1.803 m)   Wt 183 lb  9.6 oz (83.3 kg)   SpO2 98%   BMI 25.61 kg/m  BP Readings from Last 3 Encounters:  08/19/21 114/77  08/13/21 128/82  08/06/21 125/81   Wt Readings from Last 3 Encounters:  08/19/21 183 lb 9.6 oz (83.3 kg)  08/10/21 183 lb 3.2 oz (83.1 kg)  07/08/21 186 lb 2 oz (84.4 kg)      Physical Exam Vitals and nursing note reviewed.  Constitutional:      Appearance: Normal appearance.  HENT:     Head: Normocephalic.     Right Ear: External ear normal.     Left Ear: External ear normal.     Nose: Nose normal.     Mouth/Throat:     Mouth: Mucous membranes are moist.     Pharynx: Oropharynx is clear.  Eyes:     Conjunctiva/sclera: Conjunctivae normal.  Cardiovascular:     Rate and Rhythm: Normal rate and regular rhythm.  Pulmonary:     Effort: Pulmonary effort is normal.     Breath sounds: Normal breath sounds.  Abdominal:     General: Bowel sounds are normal. There is no distension.     Tenderness: There is no abdominal tenderness. There is no right CVA tenderness or left CVA tenderness.     Comments: Patient reports mild nausea  Skin:    General: Skin is warm.  Neurological:     General: No focal deficit present.     Mental Status: He is alert and oriented to person, place, and time.  Psychiatric:        Mood and Affect: Mood normal.        Behavior: Behavior normal.      No results found for any visits on 08/19/21.  Last CBC Lab Results  Component Value Date   WBC 4.0 08/13/2021   HGB 13.6 08/13/2021   HCT 40.5 08/13/2021   MCV 90.6 08/13/2021   MCH 30.4 08/13/2021   RDW 11.4 (L) 08/13/2021   PLT 333 08/13/2021   Last metabolic panel Lab Results  Component Value Date   GLUCOSE 93 08/13/2021   NA 139 08/13/2021   K 3.3 (L) 08/13/2021   CL 104 08/13/2021   CO2 27 08/13/2021   BUN <5 (L) 08/13/2021   CREATININE 1.07 08/13/2021   GFRNONAA >60 08/13/2021   CALCIUM 9.0 08/13/2021   PHOS 4.9 (H) 08/13/2021   PROT 9.0 (H) 08/10/2021   ALBUMIN 4.5 08/10/2021    LABGLOB 2.8 01/21/2021   AGRATIO 1.8 01/21/2021   BILITOT 0.8 08/10/2021   ALKPHOS 75 08/10/2021   AST 18 08/10/2021   ALT 18 08/10/2021   ANIONGAP 8 08/13/2021   Last lipids Lab Results  Component Value  Date   CHOL 176 01/21/2021   HDL 48 01/21/2021   LDLCALC 121 (H) 01/21/2021   TRIG 36 01/21/2021   CHOLHDL 3.7 01/21/2021   Last hemoglobin A1c No results found for: "HGBA1C" Last thyroid functions No results found for: "TSH", "T3TOTAL", "T4TOTAL", "THYROIDAB" Last vitamin D No results found for: "25OHVITD2", "25OHVITD3", "VD25OH"    The ASCVD Risk score (Arnett DK, et al., 2019) failed to calculate for the following reasons:   The 2019 ASCVD risk score is only valid for ages 87 to 44    Assessment & Plan:  I completed hospital discharge instructions with patient patient verbalized understanding.  He knows to follow-up with GI.  Zofran 4 mg tablet by mouth for nausea.  Follow-up with worsening unresolved symptoms. Problem List Items Addressed This Visit   None Visit Diagnoses     Nausea    -  Primary   Relevant Medications   ondansetron (ZOFRAN) 4 MG tablet       Return if symptoms worsen or fail to improve.    Daryll Drown, NP

## 2021-08-21 ENCOUNTER — Telehealth: Payer: No Typology Code available for payment source | Admitting: Family Medicine

## 2021-08-21 ENCOUNTER — Other Ambulatory Visit: Payer: Self-pay

## 2021-08-21 DIAGNOSIS — A6000 Herpesviral infection of urogenital system, unspecified: Secondary | ICD-10-CM

## 2021-08-21 MED ORDER — VALACYCLOVIR HCL 500 MG PO TABS
500.0000 mg | ORAL_TABLET | Freq: Two times a day (BID) | ORAL | 0 refills | Status: AC
  Filled 2021-08-21: qty 6, 3d supply, fill #0

## 2021-08-21 NOTE — Progress Notes (Signed)
E-Visit for Herpes Simplex  We are sorry that you are not feeling well.  Here is how we plan to help!  Based on what you have shared ith me, it looks like you may be having an outbreak/flare-up of genital herpes.    I have prescribed I have prescribed Valacyclovir 500 mg Take one by mouth twice a day for 3 days.    If you have been prescribed long term medications to be taken on a regular basis, it is important to follow the recommendations and take them as ordered.    Outbreaks usually include blisters and open sores in the genital area. Outbreaks that happen after the first time are usually not as severe and do not last as long. Genital Herpes Simplex is a commonly sexually transmitted viral infection that is found worldwide. Most of these genital infections are caused by one or two herpes simplex viruses that is passed from person to person during vaginal, oral, or anal sex. Sometimes, people do not know they have herpes because they do not have any symptoms.  Please be aware that if you have genital herpes you can be contagious even when you are not having rash or flare-up and you may not have any symptoms, even when you are taking suppressive medicines.  Herpes cannot be cured. The disease usually causes most problems during the first few years. After that, the virus is still there, but it causes few to no symptoms. Even when the virus is active, people with herpes can take medicines to reduce and help prevent symptoms.  Herpes is an infection that can cause blisters and open sores on the genital area. Herpes is caused by a virus that is passed from person to person during vaginal, oral, or anal sex. Sometimes, people do not know they have herpes because they do not have any symptoms. Herpes cannot be cured. The disease usually causes most problems during the first few years. After that, the virus is still there, but it causes few to no symptoms. Even when the virus is active, people with herpes  can take medicines to reduce and help prevent symptoms.  If you have been prescribed medications to be taken on a regular basis, it is important to follow the recommendations and take them as ordered.  Some people with herpes never have any symptoms. But other people can develop symptoms within a few weeks of being infected with the herpes virus   Symptoms usually include blisters in the genital area. In women, this area includes the vagina, buttocks, anus, or thighs. In men, this area includes the penis, scrotum, anus, butt, or thighs. The blisters can become painful open sores, which then crust over as they heal. Sometimes, people can have other symptoms that include:  ?Blisters on the mouth or lips ?Fever, headache, or pain in the joints ?Trouble urinating  Outbreaks might occur every month or more often, or just once or twice a year. Sometimes, people can tell when an outbreak will occur, because they feel itching or pain beforehand. Sometimes they do not know that an outbreak is coming because they have no symptoms. Whatever your pattern is, keep in mind that herpes outbreaks usually become less frequent over time as you get older. Certain things, called "triggers," can make outbreaks more likely to occur. These include stress, sunlight, menstrual periods,or getting sick.  Antiviral therapy can shorten the duration of symptoms and signs in primary infection, which, when untreated, can be associated with significant increase in the  symptoms of the disease.  HOME CARE Use a portable bath (such as a "Sitz bath") where you can sit in warm water for about 20 minutes. Your bathtub could also work. Avoid bubble baths.  Keep the genital area clean and dry and avoid tight clothes.  Take over-the-counter pain medicine such as acetaminophen (brand name: Tylenol) or ibuprofen sample brand names: Advil, Motrin). But avoid aspirin.  Only take medications as instructed by your medical team.  You are  most likely to spread herpes to a sex partner when you have blisters and open sores on your body. But it's also possible to spread herpes to your partner when you do not have any symptoms. That is because herpes can be present on your body without causing any symptoms, like blisters or pain.  Telling your sex partner that you have herpes can be hard. But it can help protect them, since there are ways to lower the risk of spreading the infection.   Using a condom every time you have sex  Not having sex when you have symptoms  Not having oral sex if you have blisters or open sores (in the genital area or around your mouth)  MAKE SURE YOU   Understand these instructions. Do not have sex without using a condom until you have been seen by a doctor and as instructed by the provider If you are not better or improved within 7 days, you MUST have a follow up at your doctor or the health department for evaluation. There are other causes of rashes in the genital region.  Thank you for choosing an e-visit.  Your e-visit answers were reviewed by a board certified advanced clinical practitioner to complete your personal care plan. Depending upon the condition, your plan could have included both over the counter or prescription medications.  Please review your pharmacy choice. Make sure the pharmacy is open so you can pick up prescription now. If there is a problem, you may contact your provider through MyChart messaging and have the prescription routed to another pharmacy.  Your safety is important to us. If you have drug allergies check your prescription carefully.   For the next 24 hours you can use MyChart to ask questions about today's visit, request a non-urgent call back, or ask for a work or school excuse. You will get an email in the next two days asking about your experience. I hope that your e-visit has been valuable and will speed your recovery.  I have provided 5 minutes of non face to face  time during this encounter for chart review and documentation.      

## 2021-08-25 ENCOUNTER — Telehealth: Payer: Self-pay | Admitting: Nurse Practitioner

## 2021-08-25 NOTE — Telephone Encounter (Signed)
FMLA  is filled and waiting for review, it should be faxed out tomorrow

## 2021-08-25 NOTE — Telephone Encounter (Signed)
I have signed FMLA today. Lynden Ang has it, she will fax it  tomorrow, why is the response routed back to me?

## 2021-10-08 ENCOUNTER — Other Ambulatory Visit: Payer: Self-pay | Admitting: Orthopaedic Surgery

## 2021-10-08 ENCOUNTER — Other Ambulatory Visit: Payer: Self-pay | Admitting: Nurse Practitioner

## 2021-10-08 ENCOUNTER — Other Ambulatory Visit: Payer: Self-pay

## 2021-10-09 ENCOUNTER — Other Ambulatory Visit: Payer: Self-pay

## 2021-10-09 MED FILL — Ibuprofen Tab 800 MG: ORAL | 20 days supply | Qty: 60 | Fill #0 | Status: CN

## 2021-10-13 ENCOUNTER — Other Ambulatory Visit: Payer: Self-pay

## 2021-10-13 ENCOUNTER — Emergency Department (HOSPITAL_BASED_OUTPATIENT_CLINIC_OR_DEPARTMENT_OTHER)
Admission: EM | Admit: 2021-10-13 | Discharge: 2021-10-13 | Discharge status: Against Medical Advice | Payer: No Typology Code available for payment source | Attending: Emergency Medicine | Admitting: Emergency Medicine

## 2021-10-13 ENCOUNTER — Encounter (HOSPITAL_BASED_OUTPATIENT_CLINIC_OR_DEPARTMENT_OTHER): Payer: Self-pay

## 2021-10-13 DIAGNOSIS — R109 Unspecified abdominal pain: Secondary | ICD-10-CM | POA: Insufficient documentation

## 2021-10-13 DIAGNOSIS — Z5321 Procedure and treatment not carried out due to patient leaving prior to being seen by health care provider: Secondary | ICD-10-CM | POA: Insufficient documentation

## 2021-10-13 LAB — LIPASE, BLOOD: Lipase: 16 U/L (ref 11–51)

## 2021-10-13 LAB — COMPREHENSIVE METABOLIC PANEL
ALT: 15 U/L (ref 0–44)
AST: 17 U/L (ref 15–41)
Albumin: 5.3 g/dL — ABNORMAL HIGH (ref 3.5–5.0)
Alkaline Phosphatase: 86 U/L (ref 38–126)
Anion gap: 12 (ref 5–15)
BUN: 19 mg/dL (ref 6–20)
CO2: 26 mmol/L (ref 22–32)
Calcium: 10.3 mg/dL (ref 8.9–10.3)
Chloride: 101 mmol/L (ref 98–111)
Creatinine, Ser: 1.02 mg/dL (ref 0.61–1.24)
GFR, Estimated: >60 mL/min (ref >60)
Glucose, Bld: 86 mg/dL (ref 70–99)
Potassium: 4 mmol/L (ref 3.5–5.1)
Sodium: 139 mmol/L (ref 135–145)
Total Bilirubin: 0.7 mg/dL (ref 0.3–1.2)
Total Protein: 9.1 g/dL — ABNORMAL HIGH (ref 6.5–8.1)

## 2021-10-13 LAB — URINALYSIS, ROUTINE W REFLEX MICROSCOPIC
Bilirubin Urine: NEGATIVE
Glucose, UA: NEGATIVE mg/dL
Hgb urine dipstick: NEGATIVE
Ketones, ur: 40 mg/dL — AB
Leukocytes,Ua: NEGATIVE
Nitrite: NEGATIVE
Specific Gravity, Urine: 1.037 — ABNORMAL HIGH (ref 1.005–1.030)
pH: 6 (ref 5.0–8.0)

## 2021-10-13 LAB — CBC
HCT: 46.6 % (ref 39.0–52.0)
Hemoglobin: 15.6 g/dL (ref 13.0–17.0)
MCH: 29.9 pg (ref 26.0–34.0)
MCHC: 33.5 g/dL (ref 30.0–36.0)
MCV: 89.4 fL (ref 80.0–100.0)
Platelets: 244 10*3/uL (ref 150–400)
RBC: 5.21 MIL/uL (ref 4.22–5.81)
RDW: 12.3 % (ref 11.5–15.5)
WBC: 9.9 10*3/uL (ref 4.0–10.5)
nRBC: 0 % (ref 0.0–0.2)

## 2021-10-13 NOTE — ED Triage Notes (Signed)
POV, hx diverticulitis.  Today started having stomach cramps, but sts this feels a little different. Pt denies NVD.  A&O x 4, ambulatory to triage.

## 2021-10-14 ENCOUNTER — Ambulatory Visit
Admission: EM | Admit: 2021-10-14 | Discharge: 2021-10-14 | Disposition: A | Discharge status: Home / Self Care | Payer: No Typology Code available for payment source | Attending: Nurse Practitioner | Admitting: Nurse Practitioner

## 2021-10-14 ENCOUNTER — Telehealth: Payer: No Typology Code available for payment source

## 2021-10-14 DIAGNOSIS — Z8719 Personal history of other diseases of the digestive system: Secondary | ICD-10-CM | POA: Diagnosis not present

## 2021-10-14 DIAGNOSIS — R109 Unspecified abdominal pain: Secondary | ICD-10-CM

## 2021-10-14 LAB — POCT URINALYSIS DIP (MANUAL ENTRY)
Bilirubin, UA: NEGATIVE
Glucose, UA: NEGATIVE mg/dL
Leukocytes, UA: NEGATIVE
Nitrite, UA: NEGATIVE
Protein Ur, POC: NEGATIVE mg/dL
Spec Grav, UA: 1.015 (ref 1.010–1.025)
Urobilinogen, UA: 1 E.U./dL
pH, UA: 6.5 (ref 5.0–8.0)

## 2021-10-14 MED ORDER — AMOXICILLIN-POT CLAVULANATE 875-125 MG PO TABS: 1.0000 | ORAL_TABLET | Freq: Two times a day (BID) | ORAL | 0 refills | Status: DC

## 2021-10-14 NOTE — Discharge Instructions (Signed)
I reviewed the lab work that you completed 1 day ago.  Based on your symptoms, I am going to treat you prophylactically for diverticulitis. Take medication as prescribed. May continue over-the-counter Tylenol or ibuprofen as needed for pain, fever, or general discomfort. As discussed, it is imperative that you follow-up with gastroenterology and general surgery.  Recommend that you get appointment to see these providers this week. Go to the emergency department if you have worsening abdominal pain, fever, chills, nausea, vomiting, diarrhea, or other concerns. Follow-up as needed.

## 2021-10-14 NOTE — ED Provider Notes (Signed)
[facility]    CSN: [id number] Arrival date & time: 10/14/21  1211      History   Chief Complaint Chief Complaint  Patient presents with   Abdominal Pain    HPI Thomas Valdez is a 26 y.o. male.   The history is provided by the patient.   Patient presents for a 1 day history of lower abdominal pain.  Patient states that he has a history of diverticulitis, and was recently seen in the emergency department and admitted in August.  He states that at that time he did have an abscess, which was treated with antibiotics.  He states that his pain feels very similar, but not as "severe".  He states that he did go to the emergency department last evening, but left prior to being seen due to the long wait.  He states lab work was performed at that time.  Patient denies fever, chills, chest pain, nausea, vomiting, or diarrhea.  Patient states that he has been taking over-the-counter Tylenol and ibuprofen for his pain.  Patient informs that he has not been seen by general surgery or gastroenterology since he was discharged from the hospital.  Patient states "this is either my diverticulitis or a kidney stone".  Past Medical History:  Diagnosis Date   Diverticulitis     Patient Active Problem List   Diagnosis Date Noted   Diverticulitis of colon with perforation 08/10/2021   Humeral head fracture 06/11/2021   Establishing care with new doctor, encounter for 01/21/2021   Annual physical exam 01/21/2021    History reviewed. No pertinent surgical history.     Home Medications    Prior to Admission medications   Medication Sig Start Date End Date Taking? Authorizing Provider  amoxicillin-clavulanate (AUGMENTIN) 875-125 MG tablet Take 1 tablet by mouth every 12 (twelve) hours. 10/14/21  Yes Joanne Salah-Warren, Alda Lea, NP  acetaminophen (TYLENOL) 500 MG tablet Take 2 tablets (1,000 mg total) by mouth every 6 (six) hours. 08/13/21   Val Riles, MD  HYDROcodone-acetaminophen  (NORCO/VICODIN) 5-325 MG tablet Take 1 tablet by mouth every 6 (six) hours as needed for severe pain. Patient not taking: Reported on 08/19/2021 08/13/21   [name], Barnetta Chapel A, DO  ibuprofen (ADVIL) 800 MG tablet Take 1 tablet (800 mg total) by mouth every 8 (eight) hours as needed. 10/09/21   Marybelle Killings, MD  ondansetron (ZOFRAN) 4 MG tablet Take 1 tablet (4 mg total) by mouth every 8 (eight) hours as needed for nausea or vomiting. 08/19/21   Ivy Lynn, NP  traZODone (DESYREL) 50 MG tablet Take 50 mg by mouth at bedtime as needed for sleep.    [provider]    Family History Family History  Problem Relation Age of Onset   Cancer Father        prostate   Heart disease Maternal Grandmother     Social History Social History   Tobacco Use   Smoking status: Never   Smokeless tobacco: Never  Vaping Use   Vaping Use: Never used  Substance Use Topics   Alcohol use: Never    Comment: social   Drug use: Never     Allergies   Patient has no known allergies.   Review of Systems Review of Systems Per HPI  Physical Exam Triage Vital Signs ED Triage Vitals  Enc Vitals Group     BP 10/14/21 1250 113/81     Pulse Rate 10/14/21 1250 68     Resp 10/14/21  1250 14     Temp 10/14/21 1250 98.7 F (37.1 C)     Temp Source 10/14/21 1250 Oral     SpO2 10/14/21 1250 98 %     Weight --      Height --      Head Circumference --      Peak Flow --      Pain Score 10/14/21 1249 8     Pain Loc --      Pain Edu? --      Excl. in GC? --    No data found.  Updated Vital Signs BP 113/81 (BP Location: Right Arm)   Pulse 68   Temp 98.7 F (37.1 C) (Oral)   Resp 14   SpO2 98%   Visual Acuity Right Eye Distance:   Left Eye Distance:   Bilateral Distance:    Right Eye Near:   Left Eye Near:    Bilateral Near:     Physical Exam Vitals and nursing note reviewed.  Constitutional:      General: He is not in acute distress.    Appearance: He is well-developed.   HENT:     Head: Normocephalic.  Eyes:     Extraocular Movements: Extraocular movements intact.     Pupils: Pupils are equal, round, and reactive to light.  Cardiovascular:     Rate and Rhythm: Normal rate and regular rhythm.     Heart sounds: Normal heart sounds.  Pulmonary:     Effort: Pulmonary effort is normal. No respiratory distress.     Breath sounds: Normal breath sounds. No stridor. No wheezing, rhonchi or rales.  Abdominal:     General: Abdomen is flat.     Palpations: Abdomen is soft.     Tenderness: There is abdominal tenderness in the periumbilical area. There is no right CVA tenderness, left CVA tenderness, guarding or rebound.  Musculoskeletal:     Cervical back: Normal range of motion.  Skin:    General: Skin is warm and dry.  Neurological:     General: No focal deficit present.     Mental Status: He is alert and oriented to person, place, and time.  Psychiatric:        Mood and Affect: Mood normal.        Behavior: Behavior normal.      UC Treatments / Results  Labs (all labs ordered are listed, but only abnormal results are displayed) Labs Reviewed  POCT URINALYSIS DIP (MANUAL ENTRY) - Abnormal; Notable for the following components:      Result Value   Ketones, POC UA trace (5) (*)    Blood, UA trace-intact (*)    All other components within normal limits    EKG   Radiology No results found.  Procedures Procedures (including critical care time)  Medications Ordered in UC Medications - No data to display  Initial Impression / Assessment and Plan / UC Course  I have reviewed the triage vital signs and the nursing notes.  Pertinent labs & imaging results that were available during my care of the patient were reviewed by me and considered in my medical decision making (see chart for details).  Patient presents for complaints of periumbilical abdominal pain has been present for the past 24 hours.  On exam, patient has tenderness to the  periumbilical area, vital signs are stable, and he currently is in no acute distress.  Patient reports that symptoms feel similar to the episode of diverticulitis he experienced approximately  2 months ago.  Patient did have blood work completed last evening when he was waiting at the emergency department.  Lab work does not indicate clear infection at this time.  Based on the patient's presentation, his history, and current symptoms, will start patient on Augmentin for diverticulitis flare.  Patient was advised that he needs to call gastroenterology and general surgery today to make an appointment.  Strict indication of when to go to the emergency department were provided to the patient.  Patient verbalizes understanding.  All questions were answered.  Final diagnoses:  Abdominal pain, unspecified abdominal location  History of diverticulitis     Discharge Instructions      I reviewed the lab work that you completed 1 day ago.  Based on your symptoms, I am going to treat you prophylactically for diverticulitis. Take medication as prescribed. May continue over-the-counter Tylenol or ibuprofen as needed for pain, fever, or general discomfort. As discussed, it is imperative that you follow-up with gastroenterology and general surgery.  Recommend that you get appointment to see these providers this week. Go to the emergency department if you have worsening abdominal pain, fever, chills, nausea, vomiting, diarrhea, or other concerns. Follow-up as needed.    ED Prescriptions     Medication Sig Dispense Auth. Provider   amoxicillin-clavulanate (AUGMENTIN) 875-125 MG tablet Take 1 tablet by mouth every 12 (twelve) hours. 14 tablet Joevon Holliman-Warren, Sadie Haber, NP      PDMP not reviewed this encounter.   Abran Cantor, NP 10/14/21 1328

## 2021-10-14 NOTE — ED Triage Notes (Signed)
Pt reports lower abdominal pain x 1 day. Pt think can be kidneys stones or diverticulitis flared up.

## 2021-10-15 ENCOUNTER — Emergency Department (HOSPITAL_COMMUNITY): Payer: No Typology Code available for payment source

## 2021-10-15 ENCOUNTER — Telehealth: Payer: Self-pay | Admitting: Gastroenterology

## 2021-10-15 ENCOUNTER — Emergency Department (HOSPITAL_COMMUNITY)
Admission: EM | Admit: 2021-10-15 | Discharge: 2021-10-15 | Disposition: A | Discharge status: Home / Self Care | Payer: No Typology Code available for payment source | Attending: Emergency Medicine | Admitting: Emergency Medicine

## 2021-10-15 ENCOUNTER — Encounter (HOSPITAL_COMMUNITY): Payer: Self-pay

## 2021-10-15 ENCOUNTER — Other Ambulatory Visit: Payer: Self-pay

## 2021-10-15 DIAGNOSIS — D72829 Elevated white blood cell count, unspecified: Secondary | ICD-10-CM | POA: Diagnosis not present

## 2021-10-15 DIAGNOSIS — R8289 Other abnormal findings on cytological and histological examination of urine: Secondary | ICD-10-CM | POA: Diagnosis not present

## 2021-10-15 DIAGNOSIS — K5792 Diverticulitis of intestine, part unspecified, without perforation or abscess without bleeding: Secondary | ICD-10-CM

## 2021-10-15 DIAGNOSIS — R1031 Right lower quadrant pain: Secondary | ICD-10-CM | POA: Diagnosis present

## 2021-10-15 DIAGNOSIS — K5732 Diverticulitis of large intestine without perforation or abscess without bleeding: Secondary | ICD-10-CM | POA: Diagnosis not present

## 2021-10-15 LAB — CBC WITH DIFFERENTIAL/PLATELET
Abs Immature Granulocytes: 0 10*3/uL (ref 0.00–0.07)
Basophils Absolute: 0 10*3/uL (ref 0.0–0.1)
Basophils Relative: 1 %
Eosinophils Absolute: 0 10*3/uL (ref 0.0–0.5)
Eosinophils Relative: 1 %
HCT: 43.5 % (ref 39.0–52.0)
Hemoglobin: 14.5 g/dL (ref 13.0–17.0)
Immature Granulocytes: 0 %
Lymphocytes Relative: 48 %
Lymphs Abs: 1.5 10*3/uL (ref 0.7–4.0)
MCH: 30.2 pg (ref 26.0–34.0)
MCHC: 33.3 g/dL (ref 30.0–36.0)
MCV: 90.6 fL (ref 80.0–100.0)
Monocytes Absolute: 0.3 10*3/uL (ref 0.1–1.0)
Monocytes Relative: 8 %
Neutro Abs: 1.3 10*3/uL — ABNORMAL LOW (ref 1.7–7.7)
Neutrophils Relative %: 42 %
Platelets: 227 10*3/uL (ref 150–400)
RBC: 4.8 MIL/uL (ref 4.22–5.81)
RDW: 12.2 % (ref 11.5–15.5)
WBC: 3.2 10*3/uL — ABNORMAL LOW (ref 4.0–10.5)
nRBC: 0 % (ref 0.0–0.2)

## 2021-10-15 LAB — COMPREHENSIVE METABOLIC PANEL
ALT: 17 U/L (ref 0–44)
AST: 20 U/L (ref 15–41)
Albumin: 4.3 g/dL (ref 3.5–5.0)
Alkaline Phosphatase: 74 U/L (ref 38–126)
Anion gap: 8 (ref 5–15)
BUN: 16 mg/dL (ref 6–20)
CO2: 27 mmol/L (ref 22–32)
Calcium: 9.1 mg/dL (ref 8.9–10.3)
Chloride: 103 mmol/L (ref 98–111)
Creatinine, Ser: 0.95 mg/dL (ref 0.61–1.24)
GFR, Estimated: >60 mL/min (ref >60)
Glucose, Bld: 103 mg/dL — ABNORMAL HIGH (ref 70–99)
Potassium: 4.1 mmol/L (ref 3.5–5.1)
Sodium: 138 mmol/L (ref 135–145)
Total Bilirubin: 0.5 mg/dL (ref 0.3–1.2)
Total Protein: 8.3 g/dL — ABNORMAL HIGH (ref 6.5–8.1)

## 2021-10-15 LAB — URINALYSIS, ROUTINE W REFLEX MICROSCOPIC
Bacteria, UA: NONE SEEN
Bilirubin Urine: NEGATIVE
Glucose, UA: NEGATIVE mg/dL
Ketones, ur: NEGATIVE mg/dL
Leukocytes,Ua: NEGATIVE
Nitrite: NEGATIVE
Protein, ur: NEGATIVE mg/dL
Specific Gravity, Urine: 1.014 (ref 1.005–1.030)
pH: 6 (ref 5.0–8.0)

## 2021-10-15 LAB — RAPID HIV SCREEN (HIV 1/2 AB+AG)
HIV 1/2 Antibodies: NONREACTIVE
HIV-1 P24 Antigen - HIV24: NONREACTIVE

## 2021-10-15 MED ORDER — IOHEXOL 300 MG/ML  SOLN
100.0000 mL | Freq: Once | INTRAMUSCULAR | Status: AC | PRN
  Administered 2021-10-15: 100 mL via INTRAVENOUS

## 2021-10-15 MED ORDER — KETOROLAC TROMETHAMINE 30 MG/ML IJ SOLN
30.0000 mg | Freq: Once | INTRAMUSCULAR | Status: AC
  Administered 2021-10-15: 30 mg via INTRAVENOUS
  Filled 2021-10-15: qty 1

## 2021-10-15 MED ORDER — AMOXICILLIN-POT CLAVULANATE 875-125 MG PO TABS: 1.0000 | ORAL_TABLET | Freq: Two times a day (BID) | ORAL | 0 refills | Status: DC

## 2021-10-15 NOTE — Discharge Instructions (Addendum)
You were seen in the ED today for your abdominal pain.  It was determined that you have diverticulitis and potentially another condition called colitis.  I have attached information about both of these conditions.  We are sending you home with 14 additional days of Augmentin.  Please continue to take the 7-day prescription you were prescribed yesterday at urgent care.  Once you have completed your 7 days, start the 14-day prescription to total 21 days on Augmentin.  We have referred you to a GI specialist for further follow-up.  Their office should reach out to you, however, you can call their office to schedule an appointment within the week.  It is pertinent that you follow-up with GI.  If you develop new or worsening symptoms, return to ED for further evaluation and treatment.

## 2021-10-15 NOTE — ED Notes (Signed)
Pt in bed, pt states that his pain is a little bit better at this time.

## 2021-10-15 NOTE — ED Notes (Signed)
Pt in bed, pt states that his pain is still a 7/10, states that he doesn't need anything more before he goes home, d/c pt iv, cath intact, pt verbalized understanding d/c and follow up. Pt ambulatory from dpt with sig other.

## 2021-10-15 NOTE — ED Triage Notes (Signed)
Pt reports right lower abdominal pain x 3 days. Pt seen UC and was given abx yesterday. Hx diverticulitis.

## 2021-10-15 NOTE — ED Provider Notes (Signed)
[facility] EMERGENCY DEPARTMENT Provider Note   CSN: [id number] Arrival date & time: 10/15/21  1015     History  Chief Complaint  Patient presents with   Abdominal Pain    Thomas Valdez is a 26 y.o. male with history of diverticulitis presents to the ED with concerns of suprapubic and right lower quadrant abdominal discomfort.  Patient states symptoms began approximately 3 days ago.  He was seen at an urgent care yesterday and was prescribed an antibiotic.  Patient has been taking the antibiotic as prescribed.  Patient came in with concerns of diverticulitis.  He states he was admitted back in 08/2021 for diverticulitis, IV antibiotics, and a abscess in his abdomen.  Patient was advised to follow-up with general surgery but was unable to.  He also complains of an occasional sharp pain when urinating.  Denies nausea, vomiting, diarrhea, constipation, fever, chills, melena, hematochezia, hematuria, increased urinary frequency.       Home Medications Prior to Admission medications   Medication Sig Start Date End Date Taking? Authorizing Provider  acetaminophen (TYLENOL) 500 MG tablet Take 2 tablets (1,000 mg total) by mouth every 6 (six) hours. 08/13/21   Val Riles, MD  amoxicillin-clavulanate (AUGMENTIN) 875-125 MG tablet Take 1 tablet by mouth every 12 (twelve) hours. 10/14/21   Leath-Warren, Alda Lea, NP  HYDROcodone-acetaminophen (NORCO/VICODIN) 5-325 MG tablet Take 1 tablet by mouth every 6 (six) hours as needed for severe pain. Patient not taking: Reported on 08/19/2021 08/13/21   [name], Barnetta Chapel A, DO  ibuprofen (ADVIL) 800 MG tablet Take 1 tablet (800 mg total) by mouth every 8 (eight) hours as needed. 10/09/21   Marybelle Killings, MD  ondansetron (ZOFRAN) 4 MG tablet Take 1 tablet (4 mg total) by mouth every 8 (eight) hours as needed for nausea or vomiting. 08/19/21   Ivy Lynn, NP  traZODone (DESYREL) 50 MG tablet Take 50 mg by mouth at bedtime as needed for sleep.     [provider]      Allergies    Patient has no known allergies.    Review of Systems   Review of Systems  Constitutional:  Negative for appetite change.  Gastrointestinal:  Positive for abdominal pain. Negative for abdominal distention and diarrhea.  Genitourinary:  Positive for dysuria.    Physical Exam Updated Vital Signs BP (!) 124/91 (BP Location: Right Arm)   Pulse 67   Temp 98 F (36.7 C) (Oral)   Resp 18   Ht 5\' 11"  (1.803 m)   Wt 81.6 kg   SpO2 95%   BMI 25.10 kg/m  Physical Exam Vitals and nursing note reviewed.  Constitutional:      General: He is not in acute distress.    Appearance: Normal appearance. He is not ill-appearing or diaphoretic.  HENT:     Mouth/Throat:     Mouth: Mucous membranes are moist.  Cardiovascular:     Rate and Rhythm: Normal rate and regular rhythm.  Pulmonary:     Effort: Pulmonary effort is normal.  Abdominal:     General: Abdomen is flat. Bowel sounds are normal.     Palpations: Abdomen is soft. There is no mass.     Tenderness: There is abdominal tenderness in the right lower quadrant and suprapubic area.  Skin:    General: Skin is warm and dry.     Capillary Refill: Capillary refill takes less than 2 seconds.  Neurological:     Mental Status: He is alert. Mental  status is at baseline.  Psychiatric:        Mood and Affect: Mood normal.        Behavior: Behavior normal.     ED Results / Procedures / Treatments   Labs (all labs ordered are listed, but only abnormal results are displayed) Labs Reviewed  CBC WITH DIFFERENTIAL/PLATELET - Abnormal; Notable for the following components:      Result Value   WBC 3.2 (*)    Neutro Abs 1.3 (*)    All other components within normal limits  COMPREHENSIVE METABOLIC PANEL - Abnormal; Notable for the following components:   Glucose, Bld 103 (*)    Total Protein 8.3 (*)    All other components within normal limits  URINALYSIS, ROUTINE W REFLEX MICROSCOPIC - Abnormal;  Notable for the following components:   Hgb urine dipstick SMALL (*)    All other components within normal limits    EKG None  Radiology No results found.  Procedures Procedures    Medications Ordered in ED Medications - No data to display  ED Course/ Medical Decision Making/ A&P                           Medical Decision Making Amount and/or Complexity of Data Reviewed External Data Reviewed: radiology and notes. Labs: ordered. Radiology: ordered.  Risk Prescription drug management.   This patient presents to the ED for concern of RLQ and suprapubic abdominal pain differential diagnosis includes reticulitis, kidney stone, pericolic abscess.  Less suspicious of appendicitis due to lack of fever, negative appendiceal signs.  Patient does not have a surgical abdomen    Additional history obtained:  External records from outside source obtained and reviewed including visit to UC on 10/14/2021, notes from hospital admission on 08/10/2021, previous CT abdomen pelvis.   Lab Tests:  I Ordered, and personally interpreted labs.  The pertinent results include: WBC 3.2, small amount of Hgb in UA Due to patient's low WBC, added on rapid HIV test   Imaging Studies ordered:  I ordered imaging studies including CT abdomen pelvis I independently visualized and interpreted imaging which showed acute and chronic changes to both large and small intestine including diverticulitis.  Inflammation noted to small bowel.  No obvious abscess but stranding noted around small bowel loops.  Potential colitis. I agree with the radiologist interpretation    Medicines ordered and prescription drug management:  I ordered medication including Toradol for pain management Reevaluation of the patient after these medicines showed that the patient improved I have reviewed the patients home medicines and have made adjustments as needed   Problem List / ED Course:  Acute diverticulitis without  perforation Possible colitis  Discussed assessment and plan with Dr. Hyacinth Meeker who agreed.  Due to patient's history of previous hospital admission for diverticulitis requiring IV antibiotics, will repeat labs and imaging today.  Patient also history of pericolic abscess.  Given results of CT, patient will likely need a follow-up colonoscopy.  GI was consulted and recommended 3 weeks of Augmentin.  Patient was placed on Augmentin 10/14/2021, will provide prescription to extend from 7 days to 21 days.  Patient will follow-up with GI on an outpatient basis.  Gave patient return precautions to ED should he develop any new or worsening symptoms.          Final Clinical Impression(s) / ED Diagnoses Final diagnoses:  None    Rx / DC Orders ED Discharge Orders  None         Lenard Simmer, [place] 10/15/21 1523    Eber Hong, MD 10/15/21 1530

## 2021-10-15 NOTE — Telephone Encounter (Signed)
Per Dr. Jenetta Downer, patient in the ED with abdominal pain. Needs to be seen in the office in 1-2 weeks by him or APP.

## 2021-10-16 ENCOUNTER — Ambulatory Visit: Payer: No Typology Code available for payment source | Admitting: Nurse Practitioner

## 2021-10-19 ENCOUNTER — Ambulatory Visit: Payer: No Typology Code available for payment source | Admitting: Nurse Practitioner

## 2021-10-19 ENCOUNTER — Encounter: Payer: Self-pay | Admitting: Nurse Practitioner

## 2021-10-19 NOTE — Progress Notes (Deleted)
August 05, 2021: acute sigmoid diverticulitis.  August 10, 2021: persistent acute sigmoid diverticulitis with developing pericolic phlegmon/abscess mearuing 2.8X2.2X 30.0 cm.   Oct 15, 2021: persistent acute diverticulitis superimpmosed on chronic changes, no signs of perforation or abscess currently.   Gen Surg appt upcoming on 10/12.

## 2021-10-20 ENCOUNTER — Ambulatory Visit: Payer: No Typology Code available for payment source | Admitting: Gastroenterology

## 2021-10-22 ENCOUNTER — Ambulatory Visit: Payer: No Typology Code available for payment source | Admitting: Surgery

## 2021-10-24 ENCOUNTER — Telehealth: Payer: No Typology Code available for payment source | Admitting: Physician Assistant

## 2021-10-24 DIAGNOSIS — A6001 Herpesviral infection of penis: Secondary | ICD-10-CM | POA: Diagnosis not present

## 2021-10-24 MED ORDER — VALACYCLOVIR HCL 500 MG PO TABS: 500.0000 mg | ORAL_TABLET | Freq: Two times a day (BID) | ORAL | 0 refills | Status: DC

## 2021-10-24 NOTE — Progress Notes (Signed)
E-Visit for Herpes Simplex  We are sorry that you are not feeling well.  Here is how we plan to help!  Based on what you have shared ith me, it looks like you may be having an outbreak/flare-up of genital herpes.    I have prescribed I have prescribed Valacyclovir 500 mg Take one by mouth twice a day for 3 days.    If you have been prescribed long term medications to be taken on a regular basis, it is important to follow the recommendations and take them as ordered.    Outbreaks usually include blisters and open sores in the genital area. Outbreaks that happen after the first time are usually not as severe and do not last as long. Genital Herpes Simplex is a commonly sexually transmitted viral infection that is found worldwide. Most of these genital infections are caused by one or two herpes simplex viruses that is passed from person to person during vaginal, oral, or anal sex. Sometimes, people do not know they have herpes because they do not have any symptoms.  Please be aware that if you have genital herpes you can be contagious even when you are not having rash or flare-up and you may not have any symptoms, even when you are taking suppressive medicines.  Herpes cannot be cured. The disease usually causes most problems during the first few years. After that, the virus is still there, but it causes few to no symptoms. Even when the virus is active, people with herpes can take medicines to reduce and help prevent symptoms.  Herpes is an infection that can cause blisters and open sores on the genital area. Herpes is caused by a virus that is passed from person to person during vaginal, oral, or anal sex. Sometimes, people do not know they have herpes because they do not have any symptoms. Herpes cannot be cured. The disease usually causes most problems during the first few years. After that, the virus is still there, but it causes few to no symptoms. Even when the virus is active, people with herpes  can take medicines to reduce and help prevent symptoms.  If you have been prescribed medications to be taken on a regular basis, it is important to follow the recommendations and take them as ordered.  Some people with herpes never have any symptoms. But other people can develop symptoms within a few weeks of being infected with the herpes virus   Symptoms usually include blisters in the genital area. In women, this area includes the vagina, buttocks, anus, or thighs. In men, this area includes the penis, scrotum, anus, butt, or thighs. The blisters can become painful open sores, which then crust over as they heal. Sometimes, people can have other symptoms that include:  ?Blisters on the mouth or lips ?Fever, headache, or pain in the joints ?Trouble urinating  Outbreaks might occur every month or more often, or just once or twice a year. Sometimes, people can tell when an outbreak will occur, because they feel itching or pain beforehand. Sometimes they do not know that an outbreak is coming because they have no symptoms. Whatever your pattern is, keep in mind that herpes outbreaks usually become less frequent over time as you get older. Certain things, called "triggers," can make outbreaks more likely to occur. These include stress, sunlight, menstrual periods,or getting sick.  Antiviral therapy can shorten the duration of symptoms and signs in primary infection, which, when untreated, can be associated with significant increase in the   symptoms of the disease.  HOME CARE Use a portable bath (such as a "Sitz bath") where you can sit in warm water for about 20 minutes. Your bathtub could also work. Avoid bubble baths.  Keep the genital area clean and dry and avoid tight clothes.  Take over-the-counter pain medicine such as acetaminophen (brand name: Tylenol) or ibuprofen sample brand names: Advil, Motrin). But avoid aspirin.  Only take medications as instructed by your medical team.  You are  most likely to spread herpes to a sex partner when you have blisters and open sores on your body. But it's also possible to spread herpes to your partner when you do not have any symptoms. That is because herpes can be present on your body without causing any symptoms, like blisters or pain.  Telling your sex partner that you have herpes can be hard. But it can help protect them, since there are ways to lower the risk of spreading the infection.   Using a condom every time you have sex  Not having sex when you have symptoms  Not having oral sex if you have blisters or open sores (in the genital area or around your mouth)  MAKE SURE YOU   Understand these instructions. Do not have sex without using a condom until you have been seen by a doctor and as instructed by the provider If you are not better or improved within 7 days, you MUST have a follow up at your doctor or the health department for evaluation. There are other causes of rashes in the genital region.  Thank you for choosing an e-visit.  Your e-visit answers were reviewed by a board certified advanced clinical practitioner to complete your personal care plan. Depending upon the condition, your plan could have included both over the counter or prescription medications.  Please review your pharmacy choice. Make sure the pharmacy is open so you can pick up prescription now. If there is a problem, you may contact your provider through MyChart messaging and have the prescription routed to another pharmacy.  Your safety is important to us. If you have drug allergies check your prescription carefully.   For the next 24 hours you can use MyChart to ask questions about today's visit, request a non-urgent call back, or ask for a work or school excuse. You will get an email in the next two days asking about your experience. I hope that your e-visit has been valuable and will speed your recovery.   I have spent 5 minutes in review of e-visit  questionnaire, review and updating patient chart, medical decision making and response to patient.   Addalynne Golding M Jaeden Messer, PA-C  

## 2021-10-27 ENCOUNTER — Other Ambulatory Visit: Payer: Self-pay

## 2021-11-12 ENCOUNTER — Telehealth: Payer: No Typology Code available for payment source | Admitting: Physician Assistant

## 2021-11-12 DIAGNOSIS — A601 Herpesviral infection of perianal skin and rectum: Secondary | ICD-10-CM

## 2021-11-12 DIAGNOSIS — A6001 Herpesviral infection of penis: Secondary | ICD-10-CM

## 2021-11-12 MED ORDER — VALACYCLOVIR HCL 500 MG PO TABS: 500.0000 mg | ORAL_TABLET | Freq: Two times a day (BID) | ORAL | 0 refills | Status: DC

## 2021-11-12 NOTE — Progress Notes (Signed)
I have spent 5 minutes in review of e-visit questionnaire, review and updating patient chart, medical decision making and response to patient.   Rufina Kimery Cody Matti Minney, PA-C    

## 2021-11-12 NOTE — Progress Notes (Signed)
E-Visit for Herpes Simplex  We are sorry that you are not feeling well.  Here is how we plan to help!  Based on what you have shared ith me, it looks like you may be having an outbreak/flare-up of genital herpes.    I have prescribed I have prescribed Valacyclovir 500 mg Take one by mouth twice a day for 3 days  Please contact your primary care provider office so they can restart your preventive dose of medication!    If you have been prescribed long term medications to be taken on a regular basis, it is important to follow the recommendations and take them as ordered.    Outbreaks usually include blisters and open sores in the genital area. Outbreaks that happen after the first time are usually not as severe and do not last as long. Genital Herpes Simplex is a commonly sexually transmitted viral infection that is found worldwide. Most of these genital infections are caused by one or two herpes simplex viruses that is passed from person to person during vaginal, oral, or anal sex. Sometimes, people do not know they have herpes because they do not have any symptoms.  Please be aware that if you have genital herpes you can be contagious even when you are not having rash or flare-up and you may not have any symptoms, even when you are taking suppressive medicines.  Herpes cannot be cured. The disease usually causes most problems during the first few years. After that, the virus is still there, but it causes few to no symptoms. Even when the virus is active, people with herpes can take medicines to reduce and help prevent symptoms.  Herpes is an infection that can cause blisters and open sores on the genital area. Herpes is caused by a virus that is passed from person to person during vaginal, oral, or anal sex. Sometimes, people do not know they have herpes because they do not have any symptoms. Herpes cannot be cured. The disease usually causes most problems during the first few years. After that, the  virus is still there, but it causes few to no symptoms. Even when the virus is active, people with herpes can take medicines to reduce and help prevent symptoms.  If you have been prescribed medications to be taken on a regular basis, it is important to follow the recommendations and take them as ordered.  Some people with herpes never have any symptoms. But other people can develop symptoms within a few weeks of being infected with the herpes virus   Symptoms usually include blisters in the genital area. In women, this area includes the vagina, buttocks, anus, or thighs. In men, this area includes the penis, scrotum, anus, butt, or thighs. The blisters can become painful open sores, which then crust over as they heal. Sometimes, people can have other symptoms that include:  ?Blisters on the mouth or lips ?Fever, headache, or pain in the joints ?Trouble urinating  Outbreaks might occur every month or more often, or just once or twice a year. Sometimes, people can tell when an outbreak will occur, because they feel itching or pain beforehand. Sometimes they do not know that an outbreak is coming because they have no symptoms. Whatever your pattern is, keep in mind that herpes outbreaks usually become less frequent over time as you get older. Certain things, called "triggers," can make outbreaks more likely to occur. These include stress, sunlight, menstrual periods,or getting sick.  Antiviral therapy can shorten the duration of  symptoms and signs in primary infection, which, when untreated, can be associated with significant increase in the symptoms of the disease.  HOME CARE Use a portable bath (such as a "Sitz bath") where you can sit in warm water for about 20 minutes. Your bathtub could also work. Avoid bubble baths.  Keep the genital area clean and dry and avoid tight clothes.  Take over-the-counter pain medicine such as acetaminophen (brand name: Tylenol) or ibuprofen sample brand names:  Advil, Motrin). But avoid aspirin.  Only take medications as instructed by your medical team.  You are most likely to spread herpes to a sex partner when you have blisters and open sores on your body. But it's also possible to spread herpes to your partner when you do not have any symptoms. That is because herpes can be present on your body without causing any symptoms, like blisters or pain.  Telling your sex partner that you have herpes can be hard. But it can help protect them, since there are ways to lower the risk of spreading the infection.   Using a condom every time you have sex  Not having sex when you have symptoms  Not having oral sex if you have blisters or open sores (in the genital area or around your mouth)  MAKE SURE YOU   Understand these instructions. Do not have sex without using a condom until you have been seen by a doctor and as instructed by the provider If you are not better or improved within 7 days, you MUST have a follow up at your doctor or the health department for evaluation. There are other causes of rashes in the genital region.  Thank you for choosing an e-visit.  Your e-visit answers were reviewed by a board certified advanced clinical practitioner to complete your personal care plan. Depending upon the condition, your plan could have included both over the counter or prescription medications.  Please review your pharmacy choice. Make sure the pharmacy is open so you can pick up prescription now. If there is a problem, you may contact your provider through [employer] and have the prescription routed to another pharmacy.  Your safety is important to [place]. If you have drug allergies check your prescription carefully.   For the next 24 hours you can use MyChart to ask questions about today's visit, request a non-urgent call back, or ask for a work or school excuse. You will get an email in the next two days asking about your experience. I hope that your  e-visit has been valuable and will speed your recovery.

## 2022-03-04 ENCOUNTER — Ambulatory Visit
Admission: EM | Admit: 2022-03-04 | Discharge: 2022-03-04 | Disposition: A | Discharge status: Home / Self Care | Payer: Federal, State, Local not specified - PPO | Attending: Physician Assistant | Admitting: Physician Assistant

## 2022-03-04 ENCOUNTER — Telehealth: Payer: Self-pay | Admitting: Physician Assistant

## 2022-03-04 DIAGNOSIS — R300 Dysuria: Secondary | ICD-10-CM | POA: Diagnosis not present

## 2022-03-04 DIAGNOSIS — R369 Urethral discharge, unspecified: Secondary | ICD-10-CM

## 2022-03-04 LAB — URINALYSIS, W/ REFLEX TO CULTURE (INFECTION SUSPECTED)
Bilirubin Urine: NEGATIVE
Glucose, UA: NEGATIVE mg/dL
Hgb urine dipstick: NEGATIVE
Ketones, ur: NEGATIVE mg/dL
Nitrite: NEGATIVE
Protein, ur: NEGATIVE mg/dL
Specific Gravity, Urine: 1.015 (ref 1.005–1.030)
WBC, UA: >50 WBC/hpf (ref 0–5)
pH: 8 (ref 5.0–8.0)

## 2022-03-04 MED ORDER — DOXYCYCLINE HYCLATE 100 MG PO CAPS: 100.0000 mg | ORAL_CAPSULE | Freq: Two times a day (BID) | ORAL | 0 refills | Status: AC

## 2022-03-04 MED ORDER — CEFTRIAXONE SODIUM 500 MG IJ SOLR
500.0000 mg | Freq: Once | INTRAMUSCULAR | Status: AC
  Administered 2022-03-04: 500 mg via INTRAMUSCULAR

## 2022-03-04 NOTE — ED Triage Notes (Signed)
Pt c/o urinary frequency,burning & pain x3 days. Noticed some discharge from penis today. Denies any concern for STI.

## 2022-03-04 NOTE — Discharge Instructions (Signed)
-  High suspicion for sexually transmitted infection.  You have been given an injection in the clinic today to cover you for gonorrhea and I sent doxycycline antibiotic to cover chlamydia.  Start the medication today.  The results will be back on your test in a couple days and we will call you with any positive results.  Results via [place]. - The culture result will be back in a couple days as well and if we need to put you on a different medication we will call you. - Make sure to practice safe sex. - Follow-up with the health department for regular screenings and discuss PrEP as well. - If you are positive for STI you will need to make sure you do not have any sexual intercourse until a week after you finish treatment and you should notify your most recent partners. -Check with your insurance about providers in the area for primary care.  You may go on to the [facility] health website and establish with primary care there if desired.

## 2022-03-04 NOTE — Progress Notes (Signed)
E-Visit for Urinary Problems ? ?Based on what you shared with me, I feel your condition warrants further evaluation and I recommend that you be seen for a face to face office visit.  Male bladder infections are not very common.  We worry about prostate or kidney conditions.  The standard of care is to examine the abdomen and kidneys, and to do a urine and blood test to make sure that something more serious is not going on.  We recommend that you see a provider today.  If your doctor's office is closed [facility] has the following Urgent Cares: ? ?  ?NOTE: You will not be charged for this e-visit. ? ?If you are having a true medical emergency please call 911.   ? ?  ? For an urgent face to face visit, [facility] has six urgent care centers for your convenience:  ?  ? [facility] [place] ?Get Driving Directions ?[phone] ?[address] ?[place], [place] [address] ?  ? [facility] ([place]) ?Get Driving Directions ?[phone] ?[address] ?[place], [place] [address] ? ?[facility] ([place]) ?Get Driving Directions ?[phone] ?[address] ?[place],  [place]  [address] ? ?[facility] ?Get Driving Directions ?[phone] ?[address] ?[place], [place] [address] ?  ? [facility] ?Get Driving Directions  ?[phone] ?[address] ?[address] ?[place], [place] [address] ?  ? [facility]gent [facility] at [place] ?Get Driving Directions ?[phone] ?[address] ?, [place] [address] ? ?Your MyChart E-visit questionnaire answers were reviewed by a board certified advanced clinical practitioner to complete your personal care plan based on your specific symptoms.  Thank you for using e-Visits. ?

## 2022-03-04 NOTE — ED Provider Notes (Signed)
[facility]    CSN: [id number] Arrival date & time: 03/04/22  1552      History   Chief Complaint Chief Complaint  Patient presents with   Dysuria   Penile Discharge    HPI Thomas Valdez is a 27 y.o. male presenting for dysuria for the past 3 days.  Patient reports cloudy penile discharge that began today.  He denies fever, urinary frequency, pelvic pain or testicular pain.  No external lesions or rashes.  Patient is sexually active with male partners and states his last partner was about 2 to 3 weeks ago.  He says he failed to use protection with his partner.  He says he has probably had 4 partners in the past 6 months.  He believes the last time he was tested for STIs was about 6 months ago.  Patient says he tries to use protection but does not always.  He has had an STI before but says it has been a long time.  He is not on PrEP.  He has not established with a PCP. Tried AZO without relief.  HPI  Past Medical History:  Diagnosis Date   Diverticulitis     Patient Active Problem List   Diagnosis Date Noted   Diverticulitis of colon with perforation 08/10/2021   Humeral head fracture 06/11/2021   Establishing care with new doctor, encounter for 01/21/2021   Annual physical exam 01/21/2021    History reviewed. No pertinent surgical history.     Home Medications    Prior to Admission medications   Medication Sig Start Date End Date Taking? Authorizing Provider  doxycycline (VIBRAMYCIN) 100 MG capsule Take 1 capsule (100 mg total) by mouth 2 (two) times daily for 7 days. 03/04/22 03/11/22 Yes Danton Clap, PA-C  valACYclovir (VALTREX) 500 MG tablet Take 1 tablet (500 mg total) by mouth 2 (two) times daily. 11/12/21  Yes Brunetta Jeans, PA-C  acetaminophen (TYLENOL) 500 MG tablet Take 2 tablets (1,000 mg total) by mouth every 6 (six) hours. 08/13/21   Val Riles, MD  ibuprofen (ADVIL) 800 MG tablet Take 1 tablet (800 mg total) by mouth every 8 (eight)  hours as needed. 10/09/21   Marybelle Killings, MD  ondansetron (ZOFRAN) 4 MG tablet Take 1 tablet (4 mg total) by mouth every 8 (eight) hours as needed for nausea or vomiting. 08/19/21   Ivy Lynn, NP  traZODone (DESYREL) 50 MG tablet Take 50 mg by mouth at bedtime as needed for sleep.    [provider]    Family History Family History  Problem Relation Age of Onset   Cancer Father        prostate   Heart disease Maternal Grandmother     Social History Social History   Tobacco Use   Smoking status: Never   Smokeless tobacco: Never  Vaping Use   Vaping Use: Never used  Substance Use Topics   Alcohol use: Yes    Comment: social   Drug use: Never     Allergies   Patient has no known allergies.   Review of Systems Review of Systems  Constitutional:  Negative for fatigue and fever.  Gastrointestinal:  Negative for abdominal pain, nausea and vomiting.  Genitourinary:  Positive for dysuria and penile discharge. Negative for frequency, genital sores, hematuria, penile pain, penile swelling, scrotal swelling, testicular pain and urgency.  Musculoskeletal:  Negative for arthralgias.  Skin:  Negative for rash.  Neurological:  Negative for weakness.  Physical Exam Triage Vital Signs ED Triage Vitals  Enc Vitals Group     BP 03/04/22 1619 131/82     Pulse Rate 03/04/22 1619 72     Resp 03/04/22 1619 16     Temp 03/04/22 1619 98.8 F (37.1 C)     Temp Source 03/04/22 1619 Oral     SpO2 03/04/22 1619 98 %     Weight 03/04/22 1618 176 lb (79.8 kg)     Height 03/04/22 1618 5' 11"$  (1.803 m)     Head Circumference --      Peak Flow --      Pain Score 03/04/22 1618 0     Pain Loc --      Pain Edu? --      Excl. in [place]? --    No data found.  Updated Vital Signs BP 131/82 (BP Location: Left Arm)   Pulse 72   Temp 98.8 F (37.1 C) (Oral)   Resp 16   Ht 5' 11"$  (1.803 m)   Wt 176 lb (79.8 kg)   SpO2 98%   BMI 24.55 kg/m      Physical Exam Vitals and  nursing note reviewed.  Constitutional:      General: He is not in acute distress.    Appearance: Normal appearance. He is well-developed. He is not ill-appearing.  HENT:     Head: Normocephalic and atraumatic.  Eyes:     General: No scleral icterus.    Conjunctiva/sclera: Conjunctivae normal.  Cardiovascular:     Rate and Rhythm: Normal rate and regular rhythm.  Pulmonary:     Effort: Pulmonary effort is normal. No respiratory distress.  Abdominal:     Palpations: Abdomen is soft.     Tenderness: There is no abdominal tenderness.  Musculoskeletal:     Cervical back: Neck supple.  Skin:    General: Skin is warm and dry.     Capillary Refill: Capillary refill takes less than 2 seconds.  Neurological:     General: No focal deficit present.     Mental Status: He is alert. Mental status is at baseline.     Motor: No weakness.     Gait: Gait normal.  Psychiatric:        Mood and Affect: Mood normal.        Behavior: Behavior normal.      UC Treatments / Results  Labs (all labs ordered are listed, but only abnormal results are displayed) Labs Reviewed  URINALYSIS, W/ REFLEX TO CULTURE (INFECTION SUSPECTED) - Abnormal; Notable for the following components:      Result Value   Leukocytes,Ua MODERATE (*)    Bacteria, UA FEW (*)    All other components within normal limits  URINE CULTURE  CYTOLOGY, (ORAL, ANAL, URETHRAL) ANCILLARY ONLY    EKG   Radiology No results found.  Procedures Procedures (including critical care time)  Medications Ordered in UC Medications  cefTRIAXone (ROCEPHIN) injection 500 mg (has no administration in time range)    Initial Impression / Assessment and Plan / UC Course  I have reviewed the triage vital signs and the nursing notes.  Pertinent labs & imaging results that were available during my care of the patient were reviewed by me and considered in my medical decision making (see chart for details).   27 year old male presents for  dysuria and urethral discharge.  Symptoms started 3 days ago.  He is sexually active with male partners and does not always use protection.  Last STI testing 6 months ago and he has had 4 partners since then.  Patient elects to forego GU exam and perform a self swab.  Urine sample also obtained.  Urine sample shows moderate leukocytes.  Urine culture sent.  Pending GC/chlamydia/trichomonas testing.  Most likely STI given high risk sexual intercourse.  Patient given 500 mg IM Rocephin in clinic and sent doxycycline to pharmacy.  Encouraged safe sex and advised to follow-up with PCP and/or the health department.  Discussed the importance of him starting PrEP and practicing safe sex.   Final Clinical Impressions(s) / UC Diagnoses   Final diagnoses:  Urethral discharge in male  Dysuria     Discharge Instructions      -High suspicion for sexually transmitted infection.  You have been given an injection in the clinic today to cover you for gonorrhea and I sent doxycycline antibiotic to cover chlamydia.  Start the medication today.  The results will be back on your test in a couple days and we will call you with any positive results.  Results via [place]. - The culture result will be back in a couple days as well and if we need to put you on a different medication we will call you. - Make sure to practice safe sex. - Follow-up with the health department for regular screenings and discuss PrEP as well. - If you are positive for STI you will need to make sure you do not have any sexual intercourse until a week after you finish treatment and you should notify your most recent partners. -Check with your insurance about providers in the area for primary care.  You may go on to the [facility] health website and establish with primary care there if desired.     ED Prescriptions     Medication Sig Dispense Auth. Provider   doxycycline (VIBRAMYCIN) 100 MG capsule Take 1 capsule (100 mg total) by mouth 2 (two)  times daily for 7 days. 14 capsule Danton Clap, PA-C      PDMP not reviewed this encounter.   Danton Clap, PA-C 03/04/22 1710

## 2022-03-05 LAB — CYTOLOGY, (ORAL, ANAL, URETHRAL) ANCILLARY ONLY
Chlamydia: NEGATIVE
Comment: NEGATIVE
Comment: NEGATIVE
Comment: NORMAL
Neisseria Gonorrhea: POSITIVE — AB
Trichomonas: NEGATIVE

## 2022-03-06 LAB — URINE CULTURE: Culture: NO GROWTH

## 2022-03-11 ENCOUNTER — Encounter: Payer: Self-pay | Admitting: Radiology

## 2022-06-17 ENCOUNTER — Ambulatory Visit: Payer: Federal, State, Local not specified - PPO

## 2022-06-17 DIAGNOSIS — K5732 Diverticulitis of large intestine without perforation or abscess without bleeding: Secondary | ICD-10-CM | POA: Diagnosis not present

## 2022-06-17 DIAGNOSIS — R112 Nausea with vomiting, unspecified: Secondary | ICD-10-CM | POA: Diagnosis not present

## 2022-06-17 DIAGNOSIS — K5792 Diverticulitis of intestine, part unspecified, without perforation or abscess without bleeding: Secondary | ICD-10-CM | POA: Diagnosis not present

## 2022-06-17 DIAGNOSIS — M545 Low back pain, unspecified: Secondary | ICD-10-CM | POA: Diagnosis not present

## 2022-06-17 DIAGNOSIS — R109 Unspecified abdominal pain: Secondary | ICD-10-CM | POA: Diagnosis not present

## 2022-06-19 DIAGNOSIS — K5792 Diverticulitis of intestine, part unspecified, without perforation or abscess without bleeding: Secondary | ICD-10-CM | POA: Diagnosis not present

## 2022-06-25 ENCOUNTER — Encounter (HOSPITAL_COMMUNITY): Payer: Self-pay | Admitting: Emergency Medicine

## 2022-06-25 ENCOUNTER — Other Ambulatory Visit: Payer: Self-pay

## 2022-06-25 ENCOUNTER — Inpatient Hospital Stay (HOSPITAL_COMMUNITY)
Admission: EM | Admit: 2022-06-25 | Discharge: 2022-06-28 | DRG: 392 | Disposition: A | Discharge status: Home / Self Care | Payer: Federal, State, Local not specified - PPO | Attending: Internal Medicine | Admitting: Internal Medicine

## 2022-06-25 ENCOUNTER — Emergency Department (HOSPITAL_COMMUNITY): Payer: Federal, State, Local not specified - PPO

## 2022-06-25 DIAGNOSIS — A528 Late syphilis, latent: Secondary | ICD-10-CM | POA: Diagnosis present

## 2022-06-25 DIAGNOSIS — Z8249 Family history of ischemic heart disease and other diseases of the circulatory system: Secondary | ICD-10-CM

## 2022-06-25 DIAGNOSIS — K5720 Diverticulitis of large intestine with perforation and abscess without bleeding: Secondary | ICD-10-CM | POA: Diagnosis not present

## 2022-06-25 DIAGNOSIS — Z79899 Other long term (current) drug therapy: Secondary | ICD-10-CM

## 2022-06-25 DIAGNOSIS — R1084 Generalized abdominal pain: Secondary | ICD-10-CM | POA: Diagnosis not present

## 2022-06-25 DIAGNOSIS — Z87442 Personal history of urinary calculi: Secondary | ICD-10-CM

## 2022-06-25 DIAGNOSIS — K5732 Diverticulitis of large intestine without perforation or abscess without bleeding: Secondary | ICD-10-CM

## 2022-06-25 DIAGNOSIS — Z8619 Personal history of other infectious and parasitic diseases: Secondary | ICD-10-CM | POA: Diagnosis not present

## 2022-06-25 DIAGNOSIS — R109 Unspecified abdominal pain: Secondary | ICD-10-CM | POA: Diagnosis not present

## 2022-06-25 LAB — COMPREHENSIVE METABOLIC PANEL
ALT: 17 U/L (ref 0–44)
AST: 19 U/L (ref 15–41)
Albumin: 4.1 g/dL (ref 3.5–5.0)
Alkaline Phosphatase: 59 U/L (ref 38–126)
Anion gap: 9 (ref 5–15)
BUN: 11 mg/dL (ref 6–20)
CO2: 24 mmol/L (ref 22–32)
Calcium: 9 mg/dL (ref 8.9–10.3)
Chloride: 101 mmol/L (ref 98–111)
Creatinine, Ser: 0.94 mg/dL (ref 0.61–1.24)
GFR, Estimated: >60 mL/min (ref >60)
Glucose, Bld: 95 mg/dL (ref 70–99)
Potassium: 3.6 mmol/L (ref 3.5–5.1)
Sodium: 134 mmol/L — ABNORMAL LOW (ref 135–145)
Total Bilirubin: 0.6 mg/dL (ref 0.3–1.2)
Total Protein: 8.3 g/dL — ABNORMAL HIGH (ref 6.5–8.1)

## 2022-06-25 LAB — CBC
HCT: 38.5 % — ABNORMAL LOW (ref 39.0–52.0)
Hemoglobin: 12.9 g/dL — ABNORMAL LOW (ref 13.0–17.0)
MCH: 30 pg (ref 26.0–34.0)
MCHC: 33.5 g/dL (ref 30.0–36.0)
MCV: 89.5 fL (ref 80.0–100.0)
Platelets: 425 10*3/uL — ABNORMAL HIGH (ref 150–400)
RBC: 4.3 MIL/uL (ref 4.22–5.81)
RDW: 12.1 % (ref 11.5–15.5)
WBC: 8.2 10*3/uL (ref 4.0–10.5)
nRBC: 0 % (ref 0.0–0.2)

## 2022-06-25 LAB — URINALYSIS, ROUTINE W REFLEX MICROSCOPIC
Bilirubin Urine: NEGATIVE
Glucose, UA: NEGATIVE mg/dL
Hgb urine dipstick: NEGATIVE
Ketones, ur: 5 mg/dL — AB
Leukocytes,Ua: NEGATIVE
Nitrite: NEGATIVE
Protein, ur: NEGATIVE mg/dL
Specific Gravity, Urine: 1.006 (ref 1.005–1.030)
pH: 7 (ref 5.0–8.0)

## 2022-06-25 LAB — LIPASE, BLOOD: Lipase: 27 U/L (ref 11–51)

## 2022-06-25 LAB — CULTURE, BLOOD (ROUTINE X 2)

## 2022-06-25 MED ORDER — MORPHINE SULFATE (PF) 4 MG/ML IV SOLN
4.0000 mg | Freq: Once | INTRAVENOUS | Status: AC
  Administered 2022-06-25: 4 mg via INTRAVENOUS
  Filled 2022-06-25: qty 1

## 2022-06-25 MED ORDER — HEPARIN SODIUM (PORCINE) 5000 UNIT/ML IJ SOLN
5000.0000 [IU] | Freq: Three times a day (TID) | INTRAMUSCULAR | Status: DC
  Filled 2022-06-25 (×5): qty 1

## 2022-06-25 MED ORDER — IOHEXOL 300 MG/ML  SOLN
100.0000 mL | Freq: Once | INTRAMUSCULAR | Status: AC | PRN
  Administered 2022-06-25: 100 mL via INTRAVENOUS

## 2022-06-25 MED ORDER — PIPERACILLIN-TAZOBACTAM 3.375 G IVPB 30 MIN
3.3750 g | Freq: Once | INTRAVENOUS | Status: AC
  Administered 2022-06-25: 3.375 g via INTRAVENOUS
  Filled 2022-06-25: qty 50

## 2022-06-25 MED ORDER — PIPERACILLIN-TAZOBACTAM 3.375 G IVPB
3.3750 g | Freq: Three times a day (TID) | INTRAVENOUS | Status: DC
  Administered 2022-06-26 – 2022-06-28 (×7): 3.375 g via INTRAVENOUS
  Filled 2022-06-25 (×7): qty 50

## 2022-06-25 MED ORDER — ACETAMINOPHEN 325 MG PO TABS: 650.0000 mg | ORAL_TABLET | Freq: Four times a day (QID) | ORAL | Status: DC | PRN

## 2022-06-25 MED ORDER — METRONIDAZOLE 500 MG/100ML IV SOLN: 500.0000 mg | Freq: Once | INTRAVENOUS | Status: DC

## 2022-06-25 MED ORDER — SODIUM CHLORIDE 0.9% FLUSH
3.0000 mL | Freq: Two times a day (BID) | INTRAVENOUS | Status: DC
  Administered 2022-06-25 – 2022-06-28 (×5): 3 mL via INTRAVENOUS

## 2022-06-25 MED ORDER — ONDANSETRON HCL 4 MG/2ML IJ SOLN
4.0000 mg | Freq: Once | INTRAMUSCULAR | Status: AC
  Administered 2022-06-25: 4 mg via INTRAVENOUS
  Filled 2022-06-25: qty 2

## 2022-06-25 MED ORDER — LACTATED RINGERS IV SOLN: INTRAVENOUS | Status: DC

## 2022-06-25 MED ORDER — SODIUM CHLORIDE 0.9 % IV SOLN: 2.0000 g | Freq: Once | INTRAVENOUS | Status: DC

## 2022-06-25 MED ORDER — MORPHINE SULFATE (PF) 2 MG/ML IV SOLN
2.0000 mg | INTRAVENOUS | Status: DC | PRN
  Administered 2022-06-26 (×3): 2 mg via INTRAVENOUS
  Filled 2022-06-25 (×3): qty 1

## 2022-06-25 MED ORDER — SODIUM CHLORIDE 0.9 % IV BOLUS
1000.0000 mL | Freq: Once | INTRAVENOUS | Status: AC
  Administered 2022-06-25: 1000 mL via INTRAVENOUS

## 2022-06-25 MED ORDER — ACETAMINOPHEN 650 MG RE SUPP: 650.0000 mg | Freq: Four times a day (QID) | RECTAL | Status: DC | PRN

## 2022-06-25 NOTE — ED Notes (Signed)
ED TO INPATIENT HANDOFF REPORT  ED Nurse Name and Phone #: Jess F   S Name/Age/Gender Thomas Valdez 27 y.o. male Room/Bed: APA19/APA19  Code Status   Code Status: Full Code  Home/SNF/Other Home Patient oriented to: self, place, time, and situation Is this baseline? Yes   Triage Complete: Triage complete  Chief Complaint Abdominal pain [R10.9]  Triage Note Pt here for c/o abdominal pain. States he was in hospital last week for a "flare up of his Diverticulitis".    Allergies No Known Allergies  Level of Care/Admitting Diagnosis ED Disposition     ED Disposition  Admit   Condition  --   Comment  Hospital Area: [facility] [[id number]]  Level of Care: Telemetry [5]  Covid Evaluation: Asymptomatic - no recent exposure (last 10 days) testing not required  Diagnosis: Abdominal pain [161096]  Admitting Physician: Darrold Junker  Attending Physician: Darrold Junker          B Medical/Surgery History Past Medical History:  Diagnosis Date   Diverticulitis    History reviewed. No pertinent surgical history.   A IV Location/Drains/Wounds Patient Lines/Drains/Airways Status     Active Line/Drains/Airways     Name Placement date Placement time Site Days   Peripheral IV 06/25/22 20 G 1.16" Anterior;Distal;Right;Upper Arm 06/25/22  1949  Arm  less than 1            Intake/Output Last 24 hours No intake or output data in the 24 hours ending 06/25/22 2259  Labs/Imaging Results for orders placed or performed during the hospital encounter of 06/25/22 (from the past 48 hour(s))  Lipase, blood     Status: None   Collection Time: 06/25/22  7:48 PM  Result Value Ref Range   Lipase 27 11 - 51 U/L    Comment: Performed at [facility], [address], [place], [place] [address]  Comprehensive metabolic panel     Status: Abnormal   Collection Time: 06/25/22  7:48 PM  Result Value Ref Range   Sodium 134 (L) 135 - 145 mmol/L    Potassium 3.6 3.5 - 5.1 mmol/L   Chloride 101 98 - 111 mmol/L   CO2 24 22 - 32 mmol/L   Glucose, Bld 95 70 - 99 mg/dL    Comment: Glucose reference range applies only to samples taken after fasting for at least 8 hours.   BUN 11 6 - 20 mg/dL   Creatinine, Ser 9.81 0.61 - 1.24 mg/dL   Calcium 9.0 8.9 - 19.1 mg/dL   Total Protein 8.3 (H) 6.5 - 8.1 g/dL   Albumin 4.1 3.5 - 5.0 g/dL   AST 19 15 - 41 U/L   ALT 17 0 - 44 U/L   Alkaline Phosphatase 59 38 - 126 U/L   Total Bilirubin 0.6 0.3 - 1.2 mg/dL   GFR, Estimated >47 >82 mL/min    Comment: (NOTE) Calculated using the CKD-EPI Creatinine Equation (2021)    Anion gap 9 5 - 15    Comment: Performed at [facility], [address], [place], [place] [address]  CBC     Status: Abnormal   Collection Time: 06/25/22  7:48 PM  Result Value Ref Range   WBC 8.2 4.0 - 10.5 K/uL   RBC 4.30 4.22 - 5.81 MIL/uL   Hemoglobin 12.9 (L) 13.0 - 17.0 g/dL   HCT 30.8 (L) 65.7 - 84.6 %   MCV 89.5 80.0 - 100.0 fL   MCH 30.0 26.0 -  34.0 pg   MCHC 33.5 30.0 - 36.0 g/dL   RDW 95.2 84.1 - 32.4 %   Platelets 425 (H) 150 - 400 K/uL   nRBC 0.0 0.0 - 0.2 %    Comment: Performed at [facility], [address] [place], [place] [address]  Urinalysis, Routine w reflex microscopic -Urine, Clean Catch     Status: Abnormal   Collection Time: 06/25/22  8:04 PM  Result Value Ref Range   Color, Urine STRAW (A) YELLOW   APPearance CLEAR CLEAR   Specific Gravity, Urine 1.006 1.005 - 1.030   pH 7.0 5.0 - 8.0   Glucose, UA NEGATIVE NEGATIVE mg/dL   Hgb urine dipstick NEGATIVE NEGATIVE   Bilirubin Urine NEGATIVE NEGATIVE   Ketones, ur 5 (A) NEGATIVE mg/dL   Protein, ur NEGATIVE NEGATIVE mg/dL   Nitrite NEGATIVE NEGATIVE   Leukocytes,Ua NEGATIVE NEGATIVE    Comment: Performed at [facility], [address], [place], [place] [address]   CT ABDOMEN PELVIS W CONTRAST  Result Date: 06/25/2022 CLINICAL DATA:  Diverticulitis with complication suspected. Abdominal pain.  Hospitalized last week for a flare-up of diverticulitis. EXAM: CT ABDOMEN AND PELVIS WITH CONTRAST TECHNIQUE: Multidetector CT imaging of the abdomen and pelvis was performed using the standard protocol following bolus administration of intravenous contrast. RADIATION DOSE REDUCTION: This exam was performed according to the departmental dose-optimization program which includes automated exposure control, adjustment of the mA and/or kV according to patient size and/or use of iterative reconstruction technique. CONTRAST:  OMNIPAQUE IOHEXOL 300 MG/ML  SOLN COMPARISON:  10/15/2021, 08/06/2021, and 07/14/2020 FINDINGS: Lower chest: Mild dependent atelectasis in the lung bases. Hepatobiliary: No focal liver abnormality is seen. No gallstones, gallbladder wall thickening, or biliary dilatation. Pancreas: Unremarkable. No pancreatic ductal dilatation or surrounding inflammatory changes. Spleen: Normal in size without focal abnormality. Adrenals/Urinary Tract: Adrenal glands are unremarkable. Kidneys are normal, without renal calculi, focal lesion, or hydronephrosis. Bladder is unremarkable. Stomach/Bowel: Stomach, small bowel, and colon are not abnormally distended. Inflammatory process in the pelvis with thick-walled segment of sigmoid colon likely representing diverticulitis. This involves to loops of redundant sigmoid colon. Inflammatory reaction in the adjacent pelvic soft tissues with loculated gas and fluid collection measuring about 2.7 cm diameter consistent with an abscess. Adjacent small bowel loops are also thick walled likely representing reactive inflammation. This is in the same area as previous CT scans have demonstrated inflammatory changes likely due to diverticulitis. Could also consider Crohn disease in the setting of recurrent infections. No proximal obstruction. Appendix is normal. Vascular/Lymphatic: No significant vascular findings are present. No enlarged abdominal or pelvic lymph nodes.  Reproductive: Prostate is unremarkable. Other: Small amount of free fluid in the pelvis is likely reactive. No free air in the abdomen. Abdominal wall musculature appears intact. Musculoskeletal: No acute or significant osseous findings. IMPRESSION: 1. Inflammatory reaction in the pelvis with wall thickening involving 2 loops of redundant sigmoid colon. Associated soft tissue infiltration and a 2.7 cm diameter abscess are present. Adjacent small bowel loops are also thickened, likely reactive. Small amount of free fluid in the pelvis is also likely reactive. Changes likely represent diverticulitis with diverticular abscess but could also consider Crohn's disease in the setting of recurrent inflammatory process. 2. No evidence of proximal bowel obstruction. 3. Appendix is normal. Electronically Signed   By: Burman Nieves M.D.   On: 06/25/2022 21:19    Pending Labs [employer] (From admission, onward)     Start     Ordered  06/26/22 0500  Comprehensive metabolic panel  Tomorrow morning,   R        06/25/22 2257   06/26/22 0500  CBC  Tomorrow morning,   R        06/25/22 2257   06/25/22 2230  Hepatitis panel, acute  Add-on,   AD        06/25/22 2229   06/25/22 2215  Blood culture (routine x 2)  BLOOD CULTURE X 2,   R (with STAT occurrences)      06/25/22 2214   06/25/22 2208  HIV Antibody (routine testing w rflx)  (HIV Antibody (Routine testing w reflex) panel)  Once,   URGENT        06/25/22 2208   06/25/22 2208  RPR  Once,   URGENT        06/25/22 2208            Vitals/Pain Today's Vitals   06/25/22 1937 06/25/22 1937 06/25/22 2135  BP:  (!) 140/94   Pulse:  (!) 128   Resp:  16   Temp:  98.4 F (36.9 C)   TempSrc:  Oral   SpO2:  100%   Weight: 78 kg    Height: 5\' 11"  (1.803 m)    PainSc: 9   5     Isolation Precautions No active isolations  Medications Medications  heparin injection 5,000 Units (has no administration in time range)  sodium chloride flush (NS) 0.9  % injection 3 mL (has no administration in time range)  acetaminophen (TYLENOL) tablet 650 mg (has no administration in time range)    Or  acetaminophen (TYLENOL) suppository 650 mg (has no administration in time range)  morphine (PF) 2 MG/ML injection 2 mg (has no administration in time range)  lactated ringers infusion (has no administration in time range)  sodium chloride 0.9 % bolus 1,000 mL (1,000 mLs Intravenous New Bag/Given 06/25/22 2003)  morphine (PF) 4 MG/ML injection 4 mg (4 mg Intravenous Given 06/25/22 2002)  ondansetron (ZOFRAN) injection 4 mg (4 mg Intravenous Given 06/25/22 2002)  iohexol (OMNIPAQUE) 300 MG/ML solution 100 mL (100 mLs Intravenous Contrast Given 06/25/22 2051)  piperacillin-tazobactam (ZOSYN) IVPB 3.375 g (0 g Intravenous Stopped 06/25/22 2225)  morphine (PF) 4 MG/ML injection 4 mg (4 mg Intravenous Given 06/25/22 2217)    Mobility walks     Focused Assessments GI   R Recommendations: See Admitting Provider Note  Report given to:   Additional Notes: n/a

## 2022-06-25 NOTE — Assessment & Plan Note (Signed)
2/2 to diverticular abscess. General surgery has been consulted . We will cont with zosyn .

## 2022-06-25 NOTE — Assessment & Plan Note (Signed)
We will obtain acute hepatitis panel, HIV RPR, GC/. Chlaymdia,

## 2022-06-25 NOTE — Assessment & Plan Note (Signed)
?   If this is diverticular or infectious pt amy needs ID consult. In am .  Pt is MSM and is at risk for other STD's and agrees to be checked,

## 2022-06-25 NOTE — H&P (Signed)
History and Physical    Patient: Thomas Valdez:[id number] DOB: [birth year] DOA: 06/25/2022 DOS: the patient was seen and examined on 06/26/2022 PCP: Daryll Drown, NP (Inactive)  Patient coming from: Home  Chief Complaint:  Chief Complaint  Patient presents with   Abdominal Pain    HPI: Thomas Valdez is a 27 y.o. male with medical history significant for diverticulitis presenting with complaints of abdominal pain that is been going on for about past 2 days.  Patient states that he was admitted to [facility] last week and discharged for diverticulitis.  He was given Augmentin but he started to have worsening lower abdominal pain even on the medication so he decided come here. Patient states that he does have an upcoming GI appointment that they made from [facility] upon discharge and he still has to see them.  Patient otherwise denies any headaches blurred vision fevers or chills diarrhea bleeding or any other concerns. In the emergency room he is alert awake oriented initial temperature 98.4 respirations of 16 tachycardic at 128 O2 sats of 100% blood pressure 140/94. Blood work showed sodium of 134 total protein 8.3 hemoglobin 12.9 platelet count of 425 urine with ketones and straw-colored.  Other blood work is within normal limits including lipase kidney function and LFTs. CT of patient okay abdomen did not show an abscess of 2.7 cm in his colon. General surgery was consulted and recommended Zosyn and will consult today morning.  Review of Systems:diverticular or infectious pt may neds  Review of Systems  Constitutional:  Positive for malaise/fatigue.  Gastrointestinal:  Positive for abdominal pain and nausea.  All other systems reviewed and are negative.  Past Medical History:  Diagnosis Date   Diverticulitis    History reviewed. No pertinent surgical history. Social History:  reports that he has never smoked. He has never used smokeless tobacco. He reports current  alcohol use. He reports that he does not use drugs.  No Known Allergies  Family History  Problem Relation Age of Onset   Cancer Father        prostate   Heart disease Maternal Grandmother     Prior to Admission medications   Medication Sig Start Date End Date Taking? Authorizing Provider  acetaminophen (TYLENOL) 500 MG tablet Take 2 tablets (1,000 mg total) by mouth every 6 (six) hours. 08/13/21   Gillis Santa, MD  ibuprofen (ADVIL) 800 MG tablet Take 1 tablet (800 mg total) by mouth every 8 (eight) hours as needed. 10/09/21   Eldred Manges, MD  ondansetron (ZOFRAN) 4 MG tablet Take 1 tablet (4 mg total) by mouth every 8 (eight) hours as needed for nausea or vomiting. 08/19/21   Daryll Drown, NP  traZODone (DESYREL) 50 MG tablet Take 50 mg by mouth at bedtime as needed for sleep.    [provider]  valACYclovir (VALTREX) 500 MG tablet Take 1 tablet (500 mg total) by mouth 2 (two) times daily. 11/12/21   Waldon Merl, PA-C     Vitals:   06/25/22 1937 06/25/22 2330 06/26/22 0001 06/26/22 0401  BP: (!) 140/94  121/76 109/79  Pulse: (!) 128 (!) 57 67 70  Resp: 16  20 20   Temp: 98.4 F (36.9 C)  98.5 F (36.9 C) 98.7 F (37.1 C)  TempSrc: Oral     SpO2: 100% 100% 100% 99%  Weight:  79.5 kg 79.5 kg   Height:  5\' 11"  (1.803 m)     Physical Exam Vitals and  nursing note reviewed.  Constitutional:      General: He is not in acute distress. HENT:     Head: Normocephalic and atraumatic.     Right Ear: Hearing normal.     Left Ear: Hearing normal.     Nose: Nose normal. No nasal deformity.     Mouth/Throat:     Lips: Pink.     Tongue: No lesions.     Pharynx: Oropharynx is clear.  Eyes:     General: Lids are normal.     Extraocular Movements: Extraocular movements intact.  Cardiovascular:     Rate and Rhythm: Normal rate and regular rhythm.     Heart sounds: Normal heart sounds.  Pulmonary:     Effort: Pulmonary effort is normal.     Breath sounds: Normal  breath sounds.  Abdominal:     General: Bowel sounds are normal. There is no distension.     Palpations: Abdomen is soft. There is no mass.     Tenderness: There is no abdominal tenderness. There is no guarding.     Hernia: No hernia is present.  Musculoskeletal:     Right lower leg: No edema.     Left lower leg: No edema.  Skin:    General: Skin is warm.  Neurological:     General: No focal deficit present.     Mental Status: He is alert and oriented to person, place, and time.     Cranial Nerves: Cranial nerves 2-12 are intact.  Psychiatric:        Attention and Perception: Attention normal.        Mood and Affect: Mood normal.        Speech: Speech normal.        Behavior: Behavior normal. Behavior is cooperative.      Labs on Admission: I have personally reviewed following labs and imaging studies  CBC: Recent Labs  Lab 06/25/22 1948  WBC 8.2  HGB 12.9*  HCT 38.5*  MCV 89.5  PLT 425*   Basic Metabolic Panel: Recent Labs  Lab 06/25/22 1948  NA 134*  K 3.6  CL 101  CO2 24  GLUCOSE 95  BUN 11  CREATININE 0.94  CALCIUM 9.0   GFR: Estimated Creatinine Clearance: 124.6 mL/min (by C-G formula based on SCr of 0.94 mg/dL). Liver Function Tests: Recent Labs  Lab 06/25/22 1948  AST 19  ALT 17  ALKPHOS 59  BILITOT 0.6  PROT 8.3*  ALBUMIN 4.1   Recent Labs  Lab 06/25/22 1948  LIPASE 27   No results for input(s): "AMMONIA" in the last 168 hours. Coagulation Profile: No results for input(s): "INR", "PROTIME" in the last 168 hours. Cardiac Enzymes: No results for input(s): "CKTOTAL", "CKMB", "CKMBINDEX", "TROPONINI" in the last 168 hours. BNP (last 3 results) No results for input(s): "PROBNP" in the last 8760 hours. HbA1C: No results for input(s): "HGBA1C" in the last 72 hours. CBG: No results for input(s): "GLUCAP" in the last 168 hours. Lipid Profile: No results for input(s): "CHOL", "HDL", "LDLCALC", "TRIG", "CHOLHDL", "LDLDIRECT" in the last 72  hours. Thyroid Function Tests: No results for input(s): "TSH", "T4TOTAL", "FREET4", "T3FREE", "THYROIDAB" in the last 72 hours. Anemia Panel: No results for input(s): "VITAMINB12", "FOLATE", "FERRITIN", "TIBC", "IRON", "RETICCTPCT" in the last 72 hours. Urine analysis:    Component Value Date/Time   COLORURINE STRAW (A) 06/25/2022 2004   APPEARANCEUR CLEAR 06/25/2022 2004   LABSPEC 1.006 06/25/2022 2004   PHURINE 7.0 06/25/2022 2004  GLUCOSEU NEGATIVE 06/25/2022 2004   HGBUR NEGATIVE 06/25/2022 2004   BILIRUBINUR NEGATIVE 06/25/2022 2004   BILIRUBINUR negative 10/14/2021 1258   KETONESUR 5 (A) 06/25/2022 2004   PROTEINUR NEGATIVE 06/25/2022 2004   UROBILINOGEN 1.0 10/14/2021 1258   UROBILINOGEN 0.2 11/04/2014 1136   NITRITE NEGATIVE 06/25/2022 2004   LEUKOCYTESUR NEGATIVE 06/25/2022 2004    Radiological Exams on Admission: CT ABDOMEN PELVIS W CONTRAST  Result Date: 06/25/2022 CLINICAL DATA:  Diverticulitis with complication suspected. Abdominal pain. Hospitalized last week for a flare-up of diverticulitis. EXAM: CT ABDOMEN AND PELVIS WITH CONTRAST TECHNIQUE: Multidetector CT imaging of the abdomen and pelvis was performed using the standard protocol following bolus administration of intravenous contrast. RADIATION DOSE REDUCTION: This exam was performed according to the departmental dose-optimization program which includes automated exposure control, adjustment of the mA and/or kV according to patient size and/or use of iterative reconstruction technique. CONTRAST:  OMNIPAQUE IOHEXOL 300 MG/ML  SOLN COMPARISON:  10/15/2021, 08/06/2021, and 07/14/2020 FINDINGS: Lower chest: Mild dependent atelectasis in the lung bases. Hepatobiliary: No focal liver abnormality is seen. No gallstones, gallbladder wall thickening, or biliary dilatation. Pancreas: Unremarkable. No pancreatic ductal dilatation or surrounding inflammatory changes. Spleen: Normal in size without focal abnormality.  Adrenals/Urinary Tract: Adrenal glands are unremarkable. Kidneys are normal, without renal calculi, focal lesion, or hydronephrosis. Bladder is unremarkable. Stomach/Bowel: Stomach, small bowel, and colon are not abnormally distended. Inflammatory process in the pelvis with thick-walled segment of sigmoid colon likely representing diverticulitis. This involves to loops of redundant sigmoid colon. Inflammatory reaction in the adjacent pelvic soft tissues with loculated gas and fluid collection measuring about 2.7 cm diameter consistent with an abscess. Adjacent small bowel loops are also thick walled likely representing reactive inflammation. This is in the same area as previous CT scans have demonstrated inflammatory changes likely due to diverticulitis. Could also consider Crohn disease in the setting of recurrent infections. No proximal obstruction. Appendix is normal. Vascular/Lymphatic: No significant vascular findings are present. No enlarged abdominal or pelvic lymph nodes. Reproductive: Prostate is unremarkable. Other: Small amount of free fluid in the pelvis is likely reactive. No free air in the abdomen. Abdominal wall musculature appears intact. Musculoskeletal: No acute or significant osseous findings. IMPRESSION: 1. Inflammatory reaction in the pelvis with wall thickening involving 2 loops of redundant sigmoid colon. Associated soft tissue infiltration and a 2.7 cm diameter abscess are present. Adjacent small bowel loops are also thickened, likely reactive. Small amount of free fluid in the pelvis is also likely reactive. Changes likely represent diverticulitis with diverticular abscess but could also consider Crohn's disease in the setting of recurrent inflammatory process. 2. No evidence of proximal bowel obstruction. 3. Appendix is normal. Electronically Signed   By: Burman Nieves M.D.   On: 06/25/2022 21:19     Data Reviewed: Relevant notes from primary care and specialist visits, past  discharge summaries as available in EHR, including Care Everywhere. Prior diagnostic testing as pertinent to current admission diagnoses Updated medications and problem lists for reconciliation ED course, including vitals, labs, imaging, treatment and response to treatment Triage notes, nursing and pharmacy notes and ED provider's notes Notable results as noted in HPI Assessment and Plan: * Abdominal pain 2/2 to diverticular abscess. General surgery has been consulted . We will cont with zosyn .   Colonic diverticular abscess ? If this is diverticular or infectious pt amy needs ID consult. In am .  Pt is MSM and is at risk for other STD's and agrees to  be checked,    History of sexually transmitted disease We will obtain acute hepatitis panel, HIV RPR, GC/. Chlaymdia,     DVT prophylaxis:  Heparin  Consults:  General surgery  Advance Care Planning:    Code Status: Full Code   Family Communication:  None  Disposition Plan:  Back to previous home environment  Severity of Illness: The appropriate patient status for this patient is INPATIENT. Inpatient status is judged to be reasonable and necessary in order to provide the required intensity of service to ensure the patient's safety. The patient's presenting symptoms, physical exam findings, and initial radiographic and laboratory data in the context of their chronic comorbidities is felt to place them at high risk for further clinical deterioration. Furthermore, it is not anticipated that the patient will be medically stable for discharge from the hospital within 2 midnights of admission.   * I certify that at the point of admission it is my clinical judgment that the patient will require inpatient hospital care spanning beyond 2 midnights from the point of admission due to high intensity of service, high risk for further deterioration and high frequency of surveillance required.*  Author: Gertha Calkin, MD 06/26/2022 5:22  AM  For on call review [email or web address].

## 2022-06-25 NOTE — ED Triage Notes (Signed)
Pt here for c/o abdominal pain. States he was in hospital last week for a "flare up of his Diverticulitis".

## 2022-06-25 NOTE — ED Provider Notes (Signed)
EMERGENCY DEPARTMENT AT [facility] Provider Note  CSN: [id number] Arrival date & time: 06/25/22 1933  Chief Complaint(s) Abdominal Pain  HPI Thomas Valdez is a 27 y.o. male with a history of diverticulitis presenting to the emergency department with abdominal pain.  Patient reports he was admitted to [facility] last week, discharged Saturday for diverticulitis.  He reports that he was taking his oxycodone as needed which helped some with this pain and had been compliant with his Augmentin.  However 2 days ago, he began having worsening lower abdominal pain, nausea.  Pain was not fully relieved with his prescribed medication.  Reports vomiting once yesterday.  No fevers or chills.  Feels similar to when he was admitted.  Pain is also a little worse with urination.  Past Medical History Past Medical History:  Diagnosis Date   Diverticulitis    Patient Active Problem List   Diagnosis Date Noted   Diverticulitis of colon with perforation 08/10/2021   Humeral head fracture 06/11/2021   Establishing care with new doctor, encounter for 01/21/2021   Annual physical exam 01/21/2021   Home Medication(s) Prior to Admission medications   Medication Sig Start Date End Date Taking? Authorizing Provider  acetaminophen (TYLENOL) 500 MG tablet Take 2 tablets (1,000 mg total) by mouth every 6 (six) hours. 08/13/21   Gillis Santa, MD  ibuprofen (ADVIL) 800 MG tablet Take 1 tablet (800 mg total) by mouth every 8 (eight) hours as needed. 10/09/21   Eldred Manges, MD  ondansetron (ZOFRAN) 4 MG tablet Take 1 tablet (4 mg total) by mouth every 8 (eight) hours as needed for nausea or vomiting. 08/19/21   Daryll Drown, NP  traZODone (DESYREL) 50 MG tablet Take 50 mg by mouth at bedtime as needed for sleep.    [provider]  valACYclovir (VALTREX) 500 MG tablet Take 1 tablet (500 mg total) by mouth 2 (two) times daily. 11/12/21   Noel Journey                                                                                                                                     Past Surgical History History reviewed. No pertinent surgical history. Family History Family History  Problem Relation Age of Onset   Cancer Father        prostate   Heart disease Maternal Grandmother     Social History Social History   Tobacco Use   Smoking status: Never   Smokeless tobacco: Never  Vaping Use   Vaping Use: Never used  Substance Use Topics   Alcohol use: Yes    Comment: social   Drug use: Never   Allergies Patient has no known allergies.  Review of Systems Review of Systems  All other systems reviewed and are negative.   Physical Exam Vital Signs  I have reviewed the triage vital signs BP (!) 140/94 (  BP Location: Right Arm)   Pulse (!) 128   Temp 98.4 F (36.9 C) (Oral)   Resp 16   Ht 5\' 11"  (1.803 m)   Wt 78 kg   SpO2 100%   BMI 23.99 kg/m  Physical Exam Vitals and nursing note reviewed.  Constitutional:      General: He is not in acute distress.    Appearance: Normal appearance.  HENT:     Mouth/Throat:     Mouth: Mucous membranes are moist.  Eyes:     Conjunctiva/sclera: Conjunctivae normal.  Cardiovascular:     Rate and Rhythm: Regular rhythm. Tachycardia present.  Pulmonary:     Effort: Pulmonary effort is normal. No respiratory distress.     Breath sounds: Normal breath sounds.  Abdominal:     General: Abdomen is flat.     Palpations: Abdomen is soft.     Tenderness: There is abdominal tenderness in the right lower quadrant, suprapubic area and left lower quadrant.  Musculoskeletal:     Right lower leg: No edema.     Left lower leg: No edema.  Skin:    General: Skin is warm and dry.     Capillary Refill: Capillary refill takes less than 2 seconds.  Neurological:     Mental Status: He is alert and oriented to person, place, and time. Mental status is at baseline.  Psychiatric:        Mood and Affect:  Mood normal.        Behavior: Behavior normal.     ED Results and Treatments Labs (all labs ordered are listed, but only abnormal results are displayed) Labs Reviewed  COMPREHENSIVE METABOLIC PANEL - Abnormal; Notable for the following components:      Result Value   Sodium 134 (*)    Total Protein 8.3 (*)    All other components within normal limits  CBC - Abnormal; Notable for the following components:   Hemoglobin 12.9 (*)    HCT 38.5 (*)    Platelets 425 (*)    All other components within normal limits  URINALYSIS, ROUTINE W REFLEX MICROSCOPIC - Abnormal; Notable for the following components:   Color, Urine STRAW (*)    Ketones, ur 5 (*)    All other components within normal limits  LIPASE, BLOOD                                                                                                                          Radiology CT ABDOMEN PELVIS W CONTRAST  Result Date: 06/25/2022 CLINICAL DATA:  Diverticulitis with complication suspected. Abdominal pain. Hospitalized last week for a flare-up of diverticulitis. EXAM: CT ABDOMEN AND PELVIS WITH CONTRAST TECHNIQUE: Multidetector CT imaging of the abdomen and pelvis was performed using the standard protocol following bolus administration of intravenous contrast. RADIATION DOSE REDUCTION: This exam was performed according to the departmental dose-optimization program which includes automated exposure control, adjustment of the mA and/or kV according to patient size and/or  use of iterative reconstruction technique. CONTRAST:  OMNIPAQUE IOHEXOL 300 MG/ML  SOLN COMPARISON:  10/15/2021, 08/06/2021, and 07/14/2020 FINDINGS: Lower chest: Mild dependent atelectasis in the lung bases. Hepatobiliary: No focal liver abnormality is seen. No gallstones, gallbladder wall thickening, or biliary dilatation. Pancreas: Unremarkable. No pancreatic ductal dilatation or surrounding inflammatory changes. Spleen: Normal in size without focal abnormality.  Adrenals/Urinary Tract: Adrenal glands are unremarkable. Kidneys are normal, without renal calculi, focal lesion, or hydronephrosis. Bladder is unremarkable. Stomach/Bowel: Stomach, small bowel, and colon are not abnormally distended. Inflammatory process in the pelvis with thick-walled segment of sigmoid colon likely representing diverticulitis. This involves to loops of redundant sigmoid colon. Inflammatory reaction in the adjacent pelvic soft tissues with loculated gas and fluid collection measuring about 2.7 cm diameter consistent with an abscess. Adjacent small bowel loops are also thick walled likely representing reactive inflammation. This is in the same area as previous CT scans have demonstrated inflammatory changes likely due to diverticulitis. Could also consider Crohn disease in the setting of recurrent infections. No proximal obstruction. Appendix is normal. Vascular/Lymphatic: No significant vascular findings are present. No enlarged abdominal or pelvic lymph nodes. Reproductive: Prostate is unremarkable. Other: Small amount of free fluid in the pelvis is likely reactive. No free air in the abdomen. Abdominal wall musculature appears intact. Musculoskeletal: No acute or significant osseous findings. IMPRESSION: 1. Inflammatory reaction in the pelvis with wall thickening involving 2 loops of redundant sigmoid colon. Associated soft tissue infiltration and a 2.7 cm diameter abscess are present. Adjacent small bowel loops are also thickened, likely reactive. Small amount of free fluid in the pelvis is also likely reactive. Changes likely represent diverticulitis with diverticular abscess but could also consider Crohn's disease in the setting of recurrent inflammatory process. 2. No evidence of proximal bowel obstruction. 3. Appendix is normal. Electronically Signed   By: Burman Nieves M.D.   On: 06/25/2022 21:19    Pertinent labs & imaging results that were available during my care of the patient  were reviewed by me and considered in my medical decision making (see MDM for details).  Medications Ordered in ED Medications  piperacillin-tazobactam (ZOSYN) IVPB 3.375 g (3.375 g Intravenous New Bag/Given 06/25/22 2155)  morphine (PF) 4 MG/ML injection 4 mg (has no administration in time range)  sodium chloride 0.9 % bolus 1,000 mL (1,000 mLs Intravenous New Bag/Given 06/25/22 2003)  morphine (PF) 4 MG/ML injection 4 mg (4 mg Intravenous Given 06/25/22 2002)  ondansetron (ZOFRAN) injection 4 mg (4 mg Intravenous Given 06/25/22 2002)  iohexol (OMNIPAQUE) 300 MG/ML solution 100 mL (100 mLs Intravenous Contrast Given 06/25/22 2051)                                                                                                                                     Procedures Procedures  (including critical care time)  Medical Decision Making / ED Course   MDM:  27 year old  male presenting to the emergency department with abdominal pain.  Patient well-appearing, vital signs notable for initial tachycardia.  Does have some lower abdomen tenderness.  Differential includes persistent or complicated diverticulitis, other complication of diverticulitis such as abscess, perforation, reports normal bowel movements so less concern for obstruction.  Less likely appendicitis.  He does report an abnormal sensation urinating so we will check urinalysis as well.  He has been compliant with Augmentin in the outpatient setting so if he has persistent diverticulitis will likely need to be readmitted for further inpatient treatment.  Clinical Course as of 06/25/22 2205  Fri Jun 25, 2022  2201 Discussed with the hospitalist who will admit the patient for complicated diverticulitis. CT report also could be crohn's. Hospitalist will consider GI consult in AM. Discussed with Dr. Robyne Peers with general surgery who recommends Zosyn and will consult.  [WS]    Clinical Course User Index [WS] Lonell Grandchild, MD      Additional history obtained:  -External records from outside source obtained and reviewed including: Chart review including previous notes, labs, imaging, consultation notes including [facility] hospitalization records    Lab Tests: -I ordered, reviewed, and interpreted labs.   The pertinent results include:   Labs Reviewed  COMPREHENSIVE METABOLIC PANEL - Abnormal; Notable for the following components:      Result Value   Sodium 134 (*)    Total Protein 8.3 (*)    All other components within normal limits  CBC - Abnormal; Notable for the following components:   Hemoglobin 12.9 (*)    HCT 38.5 (*)    Platelets 425 (*)    All other components within normal limits  URINALYSIS, ROUTINE W REFLEX MICROSCOPIC - Abnormal; Notable for the following components:   Color, Urine STRAW (*)    Ketones, ur 5 (*)    All other components within normal limits  LIPASE, BLOOD    Notable for borderline hyponatremia. Mild thrombocytosis. No leukocytosis  Imaging Studies ordered: I ordered imaging studies including CT abdomen On my interpretation imaging demonstrates complicated diverticulitis I independently visualized and interpreted imaging. I agree with the radiologist interpretation   Medicines ordered and prescription drug management: Meds ordered this encounter  Medications   sodium chloride 0.9 % bolus 1,000 mL   morphine (PF) 4 MG/ML injection 4 mg   ondansetron (ZOFRAN) injection 4 mg   iohexol (OMNIPAQUE) 300 MG/ML solution 100 mL   DISCONTD: cefTRIAXone (ROCEPHIN) 2 g in sodium chloride 0.9 % 100 mL IVPB    Order Specific Question:   Antibiotic Indication:    Answer:   Intra-abdominal   DISCONTD: metroNIDAZOLE (FLAGYL) IVPB 500 mg   piperacillin-tazobactam (ZOSYN) IVPB 3.375 g    Order Specific Question:   Antibiotic Indication:    Answer:   Intra-abdominal Infection   morphine (PF) 4 MG/ML injection 4 mg    -I have reviewed the patients home medicines and have made  adjustments as needed   Consultations Obtained: I requested consultation with the general surgeon,  and discussed lab and imaging findings as well as pertinent plan - they recommend: admit to medicine, they will consult   Cardiac Monitoring: The patient was maintained on a cardiac monitor.  I personally viewed and interpreted the cardiac monitored which showed an underlying rhythm of: sinus tachycardia    Reevaluation: After the interventions noted above, I reevaluated the patient and found that their symptoms have improved  Co morbidities that complicate the patient evaluation  Past Medical History:  Diagnosis  Date   Diverticulitis       Dispostion: Disposition decision including need for hospitalization was considered, and patient admitted to the hospital.    Final Clinical Impression(s) / ED Diagnoses Final diagnoses:  Diverticulitis of large intestine with abscess without bleeding     This chart was dictated using voice recognition software.  Despite best efforts to proofread,  errors can occur which can change the documentation meaning.    Lonell Grandchild, MD 06/25/22 2205

## 2022-06-26 DIAGNOSIS — K5720 Diverticulitis of large intestine with perforation and abscess without bleeding: Secondary | ICD-10-CM | POA: Diagnosis not present

## 2022-06-26 DIAGNOSIS — R109 Unspecified abdominal pain: Secondary | ICD-10-CM | POA: Diagnosis present

## 2022-06-26 DIAGNOSIS — K5732 Diverticulitis of large intestine without perforation or abscess without bleeding: Secondary | ICD-10-CM

## 2022-06-26 DIAGNOSIS — Z8249 Family history of ischemic heart disease and other diseases of the circulatory system: Secondary | ICD-10-CM | POA: Diagnosis not present

## 2022-06-26 DIAGNOSIS — Z8619 Personal history of other infectious and parasitic diseases: Secondary | ICD-10-CM | POA: Diagnosis not present

## 2022-06-26 DIAGNOSIS — Z87442 Personal history of urinary calculi: Secondary | ICD-10-CM | POA: Diagnosis not present

## 2022-06-26 DIAGNOSIS — A528 Late syphilis, latent: Secondary | ICD-10-CM | POA: Diagnosis present

## 2022-06-26 DIAGNOSIS — Z79899 Other long term (current) drug therapy: Secondary | ICD-10-CM | POA: Diagnosis not present

## 2022-06-26 LAB — CBC
HCT: 37 % — ABNORMAL LOW (ref 39.0–52.0)
Hemoglobin: 11.8 g/dL — ABNORMAL LOW (ref 13.0–17.0)
MCH: 28.7 pg (ref 26.0–34.0)
MCHC: 31.9 g/dL (ref 30.0–36.0)
MCV: 90 fL (ref 80.0–100.0)
Platelets: 139 10*3/uL — ABNORMAL LOW (ref 150–400)
RBC: 4.11 MIL/uL — ABNORMAL LOW (ref 4.22–5.81)
RDW: 14.4 % (ref 11.5–15.5)
WBC: 7.5 10*3/uL (ref 4.0–10.5)
nRBC: 0 % (ref 0.0–0.2)

## 2022-06-26 LAB — COMPREHENSIVE METABOLIC PANEL
ALT: 16 U/L (ref 0–44)
AST: 15 U/L (ref 15–41)
Albumin: 3.4 g/dL — ABNORMAL LOW (ref 3.5–5.0)
Alkaline Phosphatase: 59 U/L (ref 38–126)
Anion gap: 9 (ref 5–15)
BUN: 9 mg/dL (ref 6–20)
CO2: 25 mmol/L (ref 22–32)
Calcium: 8.9 mg/dL (ref 8.9–10.3)
Chloride: 103 mmol/L (ref 98–111)
Creatinine, Ser: 0.9 mg/dL (ref 0.61–1.24)
GFR, Estimated: >60 mL/min (ref >60)
Glucose, Bld: 90 mg/dL (ref 70–99)
Potassium: 3.6 mmol/L (ref 3.5–5.1)
Sodium: 137 mmol/L (ref 135–145)
Total Bilirubin: 0.5 mg/dL (ref 0.3–1.2)
Total Protein: 7.1 g/dL (ref 6.5–8.1)

## 2022-06-26 LAB — CULTURE, BLOOD (ROUTINE X 2): Culture: NO GROWTH

## 2022-06-26 LAB — HEPATITIS PANEL, ACUTE
HCV Ab: NONREACTIVE
Hep A IgM: NONREACTIVE
Hep B C IgM: NONREACTIVE
Hepatitis B Surface Ag: NONREACTIVE

## 2022-06-26 LAB — RPR
RPR Ser Ql: POSITIVE — AB
RPR Titer: 1:1 {titer}

## 2022-06-26 LAB — HIV ANTIBODY (ROUTINE TESTING W REFLEX): HIV Screen 4th Generation wRfx: NONREACTIVE

## 2022-06-26 MED ORDER — OXYCODONE HCL 5 MG PO TABS
5.0000 mg | ORAL_TABLET | Freq: Four times a day (QID) | ORAL | Status: DC | PRN
  Administered 2022-06-28: 5 mg via ORAL
  Filled 2022-06-26: qty 1

## 2022-06-26 MED ORDER — MORPHINE SULFATE (PF) 2 MG/ML IV SOLN
2.0000 mg | INTRAVENOUS | Status: DC | PRN
  Administered 2022-06-26: 2 mg via INTRAVENOUS
  Filled 2022-06-26: qty 1

## 2022-06-26 MED ORDER — ACETAMINOPHEN 500 MG PO TABS
1000.0000 mg | ORAL_TABLET | Freq: Four times a day (QID) | ORAL | Status: DC
  Administered 2022-06-26 – 2022-06-28 (×7): 1000 mg via ORAL
  Filled 2022-06-26 (×8): qty 2

## 2022-06-26 MED ORDER — KETOROLAC TROMETHAMINE 15 MG/ML IJ SOLN
15.0000 mg | Freq: Four times a day (QID) | INTRAMUSCULAR | Status: DC
  Administered 2022-06-26 – 2022-06-28 (×8): 15 mg via INTRAVENOUS
  Filled 2022-06-26 (×8): qty 1

## 2022-06-26 MED ORDER — METHOCARBAMOL 500 MG PO TABS
500.0000 mg | ORAL_TABLET | Freq: Three times a day (TID) | ORAL | Status: DC
  Administered 2022-06-26 – 2022-06-28 (×6): 500 mg via ORAL
  Filled 2022-06-26 (×6): qty 1

## 2022-06-26 MED ORDER — BOOST / RESOURCE BREEZE PO LIQD CUSTOM
1.0000 | Freq: Three times a day (TID) | ORAL | Status: DC
  Administered 2022-06-26 – 2022-06-28 (×6): 1 via ORAL

## 2022-06-26 MED ORDER — SENNOSIDES-DOCUSATE SODIUM 8.6-50 MG PO TABS
1.0000 | ORAL_TABLET | Freq: Two times a day (BID) | ORAL | Status: DC
  Administered 2022-06-26 – 2022-06-28 (×3): 1 via ORAL
  Filled 2022-06-26 (×5): qty 1

## 2022-06-26 NOTE — TOC Initial Note (Addendum)
Transition of Care [facility]) - Initial/Assessment Note    Patient Details  Name: Thomas Valdez MRN: [id number] Date of Birth: [birth year]  Transition of Care (TOC) CM/SW Contact:    Catalina Gravel, LCSW Phone Number: 06/26/2022, 1:46 PM  Clinical Narrative:                 Pt recnetly DC from [facility], failed oral therapy. Requires additional medical care /monitoring. Csw spoke with pt for brief assessment. Pt does not have a current PCP, needs a new one he stated.  CSW advised pt a list of PCP's will be added to AVS.  DC Monday. TOC to follow.     Barriers to Discharge: No Barriers Identified   Patient Goals and CMS Choice            Expected Discharge Plan and Services                                              Prior Living Arrangements/Services                       Activities of Daily Living Home Assistive Devices/Equipment: None ADL Screening (condition at time of admission) Patient's cognitive ability adequate to safely complete daily activities?: Yes Is the patient deaf or have difficulty hearing?: No Does the patient have difficulty seeing, even when wearing glasses/contacts?: No Does the patient have difficulty concentrating, remembering, or making decisions?: No Patient able to express need for assistance with ADLs?: Yes Does the patient have difficulty dressing or bathing?: No Independently performs ADLs?: Yes (appropriate for developmental age) Does the patient have difficulty walking or climbing stairs?: No Weakness of Legs: None Weakness of Arms/Hands: None  Permission Sought/Granted                  Emotional Assessment              Admission diagnosis:  Abdominal pain [R10.9] Diverticulitis of large intestine with abscess without bleeding [K57.20] Diverticulitis of colon with perforation [K57.20] Patient Active Problem List   Diagnosis Date Noted   Diverticulitis of large intestine with abscess without bleeding  06/26/2022   Diverticulitis of colon with perforation 06/26/2022   Abdominal pain 06/25/2022   History of sexually transmitted disease 06/25/2022   Colonic diverticular abscess 08/10/2021   Humeral head fracture 06/11/2021   Establishing care with new doctor, encounter for 01/21/2021   Annual physical exam 01/21/2021   PCP:  Daryll Drown, NP (Inactive) Pharmacy:   [facility] [address], [place] - [address] [place] [address] Phone: [phone] Fax: [phone]  [id number] [place], [place] - [address] [place] [address] Phone: [phone] Fax: [phone]     Social Determinants of Health (SDOH) Social History: SDOH Screenings   Food Insecurity: No Food Insecurity (06/26/2022)  Housing: Low Risk  (06/26/2022)  Transportation Needs: No Transportation Needs (06/26/2022)  Utilities: Not At Risk (06/26/2022)  Depression (PHQ2-9): Low Risk  (08/19/2021)  Tobacco Use: Low Risk  (06/25/2022)   SDOH Interventions:     Readmission Risk Interventions     No data to display

## 2022-06-26 NOTE — Consult Note (Signed)
[facility] Consult  Reason for Consult: Diverticulitis with diverticular abscess Referring Physician: Dr. Suezanne Jacquet  Chief Complaint   Abdominal Pain     HPI: Thomas Valdez is a 27 y.o. male who presents with recurrent diverticulitis.  He was recently in the hospital at [facility] at the end of last week for 2 days secondary to diverticulitis noted on CT scan.  At that time, he had a questionable diverticular abscess versus large diverticulum.  He was discharged home with Augmentin.  For the first couple of days, he had no pain, but starting on Tuesday, he had worsening back and abdominal pain that prompted his visit to the emergency department.  He has had decreased oral intake since being discharged home.  He is moving his bowels, though it has been slightly less secondary to his decreased oral intake.  He was admitted back in August for the same issue.  He was supposed to follow-up with GI, but never did, as he was in the process of moving.  He has no other significant past medical history and denies any history of abdominal surgeries.  In the ED, he was noted to be hemodynamically stable.  He underwent a CT of the abdomen and pelvis which demonstrated an inflammatory reaction in the pelvis with wall thickening of 2 loops of redundant sigmoid colon with a 2.7 cm abscess present.  Changes were thought to represent diverticulitis with a diverticular abscess or close disease in the setting of recurrent inflammatory process.  He has no leukocytosis.  Other blood work is within normal limits.  Past Medical History:  Diagnosis Date   Diverticulitis     History reviewed. No pertinent surgical history.  Family History  Problem Relation Age of Onset   Cancer Father        prostate   Heart disease Maternal Grandmother     Social History   Tobacco Use   Smoking status: Never   Smokeless tobacco: Never  Vaping Use   Vaping Use: Never used  Substance Use Topics   Alcohol  use: Yes    Comment: social   Drug use: Never    Medications: I have reviewed the patient's current medications. Prior to Admission:  Medications Prior to Admission  Medication Sig Dispense Refill Last Dose   acetaminophen (TYLENOL) 500 MG tablet Take 2 tablets (1,000 mg total) by mouth every 6 (six) hours. 30 tablet 0    ibuprofen (ADVIL) 800 MG tablet Take 1 tablet (800 mg total) by mouth every 8 (eight) hours as needed. 60 tablet 1    ondansetron (ZOFRAN) 4 MG tablet Take 1 tablet (4 mg total) by mouth every 8 (eight) hours as needed for nausea or vomiting. 20 tablet 0    traZODone (DESYREL) 50 MG tablet Take 50 mg by mouth at bedtime as needed for sleep.      valACYclovir (VALTREX) 500 MG tablet Take 1 tablet (500 mg total) by mouth 2 (two) times daily. 6 tablet 0    Scheduled:  feeding supplement  1 Container Oral TID BM   heparin  5,000 Units Subcutaneous Q8H   senna-docusate  1 tablet Oral BID   sodium chloride flush  3 mL Intravenous Q12H   Continuous:  lactated ringers 100 mL/hr at 06/26/22 0534   piperacillin-tazobactam 12.5 mL/hr at 06/26/22 0534   NWG:[id number] **OR** acetaminophen, morphine injection  No Known Allergies   ROS:  Pertinent items are noted in HPI.  Blood pressure 103/65, pulse 71, temperature 98.4 F (  36.9 C), resp. rate 16, height 5\' 11"  (1.803 m), weight 79.5 kg, SpO2 100 %. Physical Exam Vitals reviewed.  Constitutional:      Appearance: He is well-developed.  HENT:     Head: Normocephalic and atraumatic.  Eyes:     Extraocular Movements: Extraocular movements intact.     Pupils: Pupils are equal, round, and reactive to light.  Cardiovascular:     Rate and Rhythm: Normal rate.  Pulmonary:     Effort: Pulmonary effort is normal.  Abdominal:     Comments: Abdomen soft, nondistended, no percussion tenderness, mild tenderness to palpation in bilateral lower quadrants; no rigidity, guarding, rebound tenderness  Skin:    General: Skin is  warm and dry.  Neurological:     General: No focal deficit present.     Mental Status: He is alert and oriented to person, place, and time.  Psychiatric:        Mood and Affect: Mood normal.        Behavior: Behavior normal.     Results: Results for orders placed or performed during the hospital encounter of 06/25/22 (from the past 48 hour(s))  Lipase, blood     Status: None   Collection Time: 06/25/22  7:48 PM  Result Value Ref Range   Lipase 27 11 - 51 U/L    Comment: Performed at [facility], [address] [place], [place] [address]  Comprehensive metabolic panel     Status: Abnormal   Collection Time: 06/25/22  7:48 PM  Result Value Ref Range   Sodium 134 (L) 135 - 145 mmol/L   Potassium 3.6 3.5 - 5.1 mmol/L   Chloride 101 98 - 111 mmol/L   CO2 24 22 - 32 mmol/L   Glucose, Bld 95 70 - 99 mg/dL    Comment: Glucose reference range applies only to samples taken after fasting for at least 8 hours.   BUN 11 6 - 20 mg/dL   Creatinine, Ser 6.04 0.61 - 1.24 mg/dL   Calcium 9.0 8.9 - 54.0 mg/dL   Total Protein 8.3 (H) 6.5 - 8.1 g/dL   Albumin 4.1 3.5 - 5.0 g/dL   AST 19 15 - 41 U/L   ALT 17 0 - 44 U/L   Alkaline Phosphatase 59 38 - 126 U/L   Total Bilirubin 0.6 0.3 - 1.2 mg/dL   GFR, Estimated >98 >11 mL/min    Comment: (NOTE) Calculated using the CKD-EPI Creatinine Equation (2021)    Anion gap 9 5 - 15    Comment: Performed at [facility], [address], [place], [place] [address]  CBC     Status: Abnormal   Collection Time: 06/25/22  7:48 PM  Result Value Ref Range   WBC 8.2 4.0 - 10.5 K/uL   RBC 4.30 4.22 - 5.81 MIL/uL   Hemoglobin 12.9 (L) 13.0 - 17.0 g/dL   HCT 29.5 (L) 62.1 - 30.8 %   MCV 89.5 80.0 - 100.0 fL   MCH 30.0 26.0 - 34.0 pg   MCHC 33.5 30.0 - 36.0 g/dL   RDW 65.7 84.6 - 96.2 %   Platelets 425 (H) 150 - 400 K/uL   nRBC 0.0 0.0 - 0.2 %    Comment: Performed at [facility], [address], [place], [place] [address]  Urinalysis, Routine w reflex  microscopic -Urine, Clean Catch     Status: Abnormal   Collection Time: 06/25/22  8:04 PM  Result Value Ref Range   Color, Urine STRAW (  A) YELLOW   APPearance CLEAR CLEAR   Specific Gravity, Urine 1.006 1.005 - 1.030   pH 7.0 5.0 - 8.0   Glucose, UA NEGATIVE NEGATIVE mg/dL   Hgb urine dipstick NEGATIVE NEGATIVE   Bilirubin Urine NEGATIVE NEGATIVE   Ketones, ur 5 (A) NEGATIVE mg/dL   Protein, ur NEGATIVE NEGATIVE mg/dL   Nitrite NEGATIVE NEGATIVE   Leukocytes,Ua NEGATIVE NEGATIVE    Comment: Performed at [facility], [address], [place], [place] [address]  Blood culture (routine x 2)     Status: None (Preliminary result)   Collection Time: 06/25/22 10:35 PM   Specimen: Left Antecubital; Blood  Result Value Ref Range   Specimen Description LEFT ANTECUBITAL    Special Requests      BOTTLES DRAWN AEROBIC AND ANAEROBIC Blood Culture adequate volume   Culture      NO GROWTH < 12 HOURS Performed at [facility], [address], [place], [place] [address]    Report Status PENDING   Blood culture (routine x 2)     Status: None (Preliminary result)   Collection Time: 06/25/22 10:37 PM   Specimen: BLOOD LEFT HAND  Result Value Ref Range   Specimen Description BLOOD LEFT HAND    Special Requests      BOTTLES DRAWN AEROBIC AND ANAEROBIC Blood Culture adequate volume   Culture      NO GROWTH < 12 HOURS Performed at [facility], [address], [place], [place] [address]    Report Status PENDING   Comprehensive metabolic panel     Status: Abnormal   Collection Time: 06/26/22  4:48 AM  Result Value Ref Range   Sodium 137 135 - 145 mmol/L   Potassium 3.6 3.5 - 5.1 mmol/L   Chloride 103 98 - 111 mmol/L   CO2 25 22 - 32 mmol/L   Glucose, Bld 90 70 - 99 mg/dL    Comment: Glucose reference range applies only to samples taken after fasting for at least 8 hours.   BUN 9 6 - 20 mg/dL   Creatinine, Ser 4.13 0.61 - 1.24 mg/dL   Calcium 8.9 8.9 - 24.4 mg/dL   Total Protein 7.1 6.5 - 8.1  g/dL   Albumin 3.4 (L) 3.5 - 5.0 g/dL   AST 15 15 - 41 U/L   ALT 16 0 - 44 U/L   Alkaline Phosphatase 59 38 - 126 U/L   Total Bilirubin 0.5 0.3 - 1.2 mg/dL   GFR, Estimated >01 >02 mL/min    Comment: (NOTE) Calculated using the CKD-EPI Creatinine Equation (2021)    Anion gap 9 5 - 15    Comment: Performed at [facility] [address], [place], [place] [address]  CBC     Status: Abnormal   Collection Time: 06/26/22  4:48 AM  Result Value Ref Range   WBC 7.5 4.0 - 10.5 K/uL   RBC 4.11 (L) 4.22 - 5.81 MIL/uL   Hemoglobin 11.8 (L) 13.0 - 17.0 g/dL   HCT 64.4 (L) 03.4 - 74.2 %   MCV 90.0 80.0 - 100.0 fL   MCH 28.7 26.0 - 34.0 pg   MCHC 31.9 30.0 - 36.0 g/dL   RDW 59.5 63.8 - 75.6 %   Platelets 139 (L) 150 - 400 K/uL   nRBC 0.0 0.0 - 0.2 %    Comment: Performed at [facility], [address], [place], [place] [address]    CT ABDOMEN PELVIS W CONTRAST  Result Date: 06/25/2022 CLINICAL DATA:  Diverticulitis  with complication suspected. Abdominal pain. Hospitalized last week for a flare-up of diverticulitis. EXAM: CT ABDOMEN AND PELVIS WITH CONTRAST TECHNIQUE: Multidetector CT imaging of the abdomen and pelvis was performed using the standard protocol following bolus administration of intravenous contrast. RADIATION DOSE REDUCTION: This exam was performed according to the departmental dose-optimization program which includes automated exposure control, adjustment of the mA and/or kV according to patient size and/or use of iterative reconstruction technique. CONTRAST:  OMNIPAQUE IOHEXOL 300 MG/ML  SOLN COMPARISON:  10/15/2021, 08/06/2021, and 07/14/2020 FINDINGS: Lower chest: Mild dependent atelectasis in the lung bases. Hepatobiliary: No focal liver abnormality is seen. No gallstones, gallbladder wall thickening, or biliary dilatation. Pancreas: Unremarkable. No pancreatic ductal dilatation or surrounding inflammatory changes. Spleen: Normal in size without focal abnormality.  Adrenals/Urinary Tract: Adrenal glands are unremarkable. Kidneys are normal, without renal calculi, focal lesion, or hydronephrosis. Bladder is unremarkable. Stomach/Bowel: Stomach, small bowel, and colon are not abnormally distended. Inflammatory process in the pelvis with thick-walled segment of sigmoid colon likely representing diverticulitis. This involves to loops of redundant sigmoid colon. Inflammatory reaction in the adjacent pelvic soft tissues with loculated gas and fluid collection measuring about 2.7 cm diameter consistent with an abscess. Adjacent small bowel loops are also thick walled likely representing reactive inflammation. This is in the same area as previous CT scans have demonstrated inflammatory changes likely due to diverticulitis. Could also consider Crohn disease in the setting of recurrent infections. No proximal obstruction. Appendix is normal. Vascular/Lymphatic: No significant vascular findings are present. No enlarged abdominal or pelvic lymph nodes. Reproductive: Prostate is unremarkable. Other: Small amount of free fluid in the pelvis is likely reactive. No free air in the abdomen. Abdominal wall musculature appears intact. Musculoskeletal: No acute or significant osseous findings. IMPRESSION: 1. Inflammatory reaction in the pelvis with wall thickening involving 2 loops of redundant sigmoid colon. Associated soft tissue infiltration and a 2.7 cm diameter abscess are present. Adjacent small bowel loops are also thickened, likely reactive. Small amount of free fluid in the pelvis is also likely reactive. Changes likely represent diverticulitis with diverticular abscess but could also consider Crohn's disease in the setting of recurrent inflammatory process. 2. No evidence of proximal bowel obstruction. 3. Appendix is normal. Electronically Signed   By: Burman Nieves M.D.   On: 06/25/2022 21:19     Assessment & Plan:  Thomas Valdez is a 27 y.o. male who was admitted with  recurrent diverticulitis with diverticular abscess.  Imaging and blood work evaluated by myself.  -I explained to the patient that his CT scan performed last week likely showed a small diverticular abscess that is more readily visible on this new imaging study.  Will still try to treat him conservatively with plan for him to follow-up with GI for colonoscopy.  I did explain to him that he will likely need surgery in the future, but we would like for the active inflammation to go down prior to surgery -Clear liquid diet ordered, may advance as tolerated -Continue IV Zosyn -IVF -No acute surgical intervention at this time -PRN pain control and antiemetics-changed Tylenol to scheduled, added scheduled Robaxin and Toradol, added as needed oxycodone -Appreciate hospitalist recommendations -Patient will need outpatient colonoscopy in 6 to 8 weeks  All questions were answered to the satisfaction of the patient and family.  -- Theophilus Kinds, DO [facility] [address] [place], [place] [address] [phone] (office)

## 2022-06-26 NOTE — Progress Notes (Signed)
PROGRESS NOTE  Thomas Valdez ZOX:[id number] DOB: Jan 09, 1996 DOA: 06/25/2022 PCP: Daryll Drown, NP (Inactive)  Brief History:  27 year old male with a history of diverticulitis and nephrolithiasis presented with 2 days of abdominal pain.  Notably, the patient was admitted to [facility] from 06/17/2022 to 06/19/2022 for acute diverticulitis. At [facility], the patient had a CT of the abdomen and pelvis reports showed marked inflammation of the upper anterior pelvis related to pronounced diverticulum extending caudally from the sigmoid colon.  There was no associated abscess formation noted at that time.  The patient was treated with intravenous antibiotics and discharged home with 10-day supply of Augmentin.  Notably, the patient did not have significant leukocytosis.  His WBC was 7.8 on 06/17/2022. Unfortunately, the patient developed worsening lower abdominal pain in the past 2 days; therefore he came to the emergency department for further evaluation and treatment. The patient complains of nausea but denies any vomiting.  He denies any fevers, chills, chest pain, shortness of breath, coughing, hemoptysis.  There is no hematochezia or melena.  There is no dysuria or hematuria.  Last BM on 6/14  Notably, the patient had a hospital admission at [facility] from 08/10/2021 to 08/13/2021 for acute diverticulitis with perforation.  The patient was treated medically.  It was felt that his abscess was too small for IR intervention.  He was treated with Zosyn and discharged home with Augmentin.  In the ED, the patient was afebrile hemodynamically stable. BMP showed a sodium 134, potassium 3.6, bicarbonate 24, serum creatinine 0.94.  LFTs were unremarkable.  Lipase 27.  WBC 8.2, hemoglobin 12.9, platelets 425,000.  UA was negative for pyuria.  CT of the abdomen pelvis showed inflammatory reaction in the pelvis with wall thickening involving 2 loops of redundant sigmoid colon.  There was some associated  soft tissue infiltration with a 2 point centimeter diameter abscess.  The adjacent small bowel loops were also thickened which was felt to be reactive.   Assessment/Plan: Acute diverticulitis of colon with perforation -06/25/22 CT abd/pelvis with 2.7 cm abscess as discussed above -general surgery consult -npo/bowel rest -continue zosyn -continue IVF -continue prn morphine  History of Gonorrhea -GC/chlamydia probe pending -follow up viral hepatitis panel/HIV       Family Communication:  noi Family at bedside  Consultants:  general surgery  Code Status:  FULL   DVT Prophylaxis:  [place] Heparin    Procedures: As Listed in Progress Note Above  Antibiotics: None    Subjective: Pt states lower abd pain is about same.  Denies f/c, cp, sob, vomiting diarrhea, hematochezia, melena  Objective: Vitals:   06/25/22 2330 06/26/22 0001 06/26/22 0401 06/26/22 0500  BP:  121/76 109/79   Pulse: (!) 57 67 70   Resp:  20 20   Temp:  98.5 F (36.9 C) 98.7 F (37.1 C)   TempSrc:      SpO2: 100% 100% 99%   Weight: 79.5 kg 79.5 kg  79.5 kg  Height: 5\' 11"  (1.803 m)       Intake/Output Summary (Last 24 hours) at 06/26/2022 0708 Last data filed at 06/26/2022 0641 Gross per 24 hour  Intake 1869.28 ml  Output --  Net 1869.28 ml   Weight change:  Exam:  General:  Pt is alert, follows commands appropriately, not in acute distress HEENT: No icterus, No thrush, No neck mass, [place]/AT Cardiovascular: RRR, S1/S2, no rubs, no gallops Respiratory: CTA bilaterally, no wheezing,  no crackles, no rhonchi Abdomen: Soft/+BS, lower abd tender, non distended, no guarding Extremities: No edema, No lymphangitis, No petechiae, No rashes, no synovitis   Data Reviewed: I have personally reviewed following labs and imaging studies Basic Metabolic Panel: Recent Labs  Lab 06/25/22 1948 06/26/22 0448  NA 134* 137  K 3.6 3.6  CL 101 103  CO2 24 25  GLUCOSE 95 90  BUN 11 9  CREATININE 0.94 0.90   CALCIUM 9.0 8.9   Liver Function Tests: Recent Labs  Lab 06/25/22 1948 06/26/22 0448  AST 19 15  ALT 17 16  ALKPHOS 59 59  BILITOT 0.6 0.5  PROT 8.3* 7.1  ALBUMIN 4.1 3.4*   Recent Labs  Lab 06/25/22 1948  LIPASE 27   No results for input(s): "AMMONIA" in the last 168 hours. Coagulation Profile: No results for input(s): "INR", "PROTIME" in the last 168 hours. CBC: Recent Labs  Lab 06/25/22 1948 06/26/22 0448  WBC 8.2 7.5  HGB 12.9* 11.8*  HCT 38.5* 37.0*  MCV 89.5 90.0  PLT 425* 139*   Cardiac Enzymes: No results for input(s): "CKTOTAL", "CKMB", "CKMBINDEX", "TROPONINI" in the last 168 hours. BNP: Invalid input(s): "POCBNP" CBG: No results for input(s): "GLUCAP" in the last 168 hours. HbA1C: No results for input(s): "HGBA1C" in the last 72 hours. Urine analysis:    Component Value Date/Time   COLORURINE STRAW (A) 06/25/2022 2004   APPEARANCEUR CLEAR 06/25/2022 2004   LABSPEC 1.006 06/25/2022 2004   PHURINE 7.0 06/25/2022 2004   GLUCOSEU NEGATIVE 06/25/2022 2004   HGBUR NEGATIVE 06/25/2022 2004   BILIRUBINUR NEGATIVE 06/25/2022 2004   BILIRUBINUR negative 10/14/2021 1258   KETONESUR 5 (A) 06/25/2022 2004   PROTEINUR NEGATIVE 06/25/2022 2004   UROBILINOGEN 1.0 10/14/2021 1258   UROBILINOGEN 0.2 11/04/2014 1136   NITRITE NEGATIVE 06/25/2022 2004   LEUKOCYTESUR NEGATIVE 06/25/2022 2004   Sepsis Labs: @LABRCNTIP (procalcitonin:4,lacticidven:4) ) Recent Results (from the past 240 hour(s))  Blood culture (routine x 2)     Status: None (Preliminary result)   Collection Time: 06/25/22 10:35 PM   Specimen: Left Antecubital; Blood  Result Value Ref Range Status   Specimen Description LEFT ANTECUBITAL  Final   Special Requests   Final    BOTTLES DRAWN AEROBIC AND ANAEROBIC Blood Culture adequate volume   Culture   Final    NO GROWTH < 12 HOURS Performed at [facility], [address], [place], [place] [address]    Report Status PENDING  Incomplete   Blood culture (routine x 2)     Status: None (Preliminary result)   Collection Time: 06/25/22 10:37 PM   Specimen: BLOOD LEFT HAND  Result Value Ref Range Status   Specimen Description BLOOD LEFT HAND  Final   Special Requests   Final    BOTTLES DRAWN AEROBIC AND ANAEROBIC Blood Culture adequate volume   Culture   Final    NO GROWTH < 12 HOURS Performed at [facility], [address], [place], [place] [address]    Report Status PENDING  Incomplete     Scheduled Meds:  feeding supplement  1 Container Oral TID BM   heparin  5,000 Units Subcutaneous Q8H   sodium chloride flush  3 mL Intravenous Q12H   Continuous Infusions:  lactated ringers 100 mL/hr at 06/26/22 0534   piperacillin-tazobactam 12.5 mL/hr at 06/26/22 0534    Procedures/Studies: CT ABDOMEN PELVIS W CONTRAST  Result Date: 06/25/2022 CLINICAL DATA:  Diverticulitis with complication suspected. Abdominal pain. Hospitalized last week for  a flare-up of diverticulitis. EXAM: CT ABDOMEN AND PELVIS WITH CONTRAST TECHNIQUE: Multidetector CT imaging of the abdomen and pelvis was performed using the standard protocol following bolus administration of intravenous contrast. RADIATION DOSE REDUCTION: This exam was performed according to the departmental dose-optimization program which includes automated exposure control, adjustment of the mA and/or kV according to patient size and/or use of iterative reconstruction technique. CONTRAST:  OMNIPAQUE IOHEXOL 300 MG/ML  SOLN COMPARISON:  10/15/2021, 08/06/2021, and 07/14/2020 FINDINGS: Lower chest: Mild dependent atelectasis in the lung bases. Hepatobiliary: No focal liver abnormality is seen. No gallstones, gallbladder wall thickening, or biliary dilatation. Pancreas: Unremarkable. No pancreatic ductal dilatation or surrounding inflammatory changes. Spleen: Normal in size without focal abnormality. Adrenals/Urinary Tract: Adrenal glands are unremarkable. Kidneys are normal, without renal  calculi, focal lesion, or hydronephrosis. Bladder is unremarkable. Stomach/Bowel: Stomach, small bowel, and colon are not abnormally distended. Inflammatory process in the pelvis with thick-walled segment of sigmoid colon likely representing diverticulitis. This involves to loops of redundant sigmoid colon. Inflammatory reaction in the adjacent pelvic soft tissues with loculated gas and fluid collection measuring about 2.7 cm diameter consistent with an abscess. Adjacent small bowel loops are also thick walled likely representing reactive inflammation. This is in the same area as previous CT scans have demonstrated inflammatory changes likely due to diverticulitis. Could also consider Crohn disease in the setting of recurrent infections. No proximal obstruction. Appendix is normal. Vascular/Lymphatic: No significant vascular findings are present. No enlarged abdominal or pelvic lymph nodes. Reproductive: Prostate is unremarkable. Other: Small amount of free fluid in the pelvis is likely reactive. No free air in the abdomen. Abdominal wall musculature appears intact. Musculoskeletal: No acute or significant osseous findings. IMPRESSION: 1. Inflammatory reaction in the pelvis with wall thickening involving 2 loops of redundant sigmoid colon. Associated soft tissue infiltration and a 2.7 cm diameter abscess are present. Adjacent small bowel loops are also thickened, likely reactive. Small amount of free fluid in the pelvis is also likely reactive. Changes likely represent diverticulitis with diverticular abscess but could also consider Crohn's disease in the setting of recurrent inflammatory process. 2. No evidence of proximal bowel obstruction. 3. Appendix is normal. Electronically Signed   By: Burman Nieves M.D.   On: 06/25/2022 21:19    Catarina Hartshorn, DO  [facility]  If 7PM-7AM, please contact night-coverage [email or web address] Password [email or web address] 06/26/2022, 7:08 AM   LOS: 0 days \

## 2022-06-26 NOTE — Plan of Care (Signed)

## 2022-06-26 NOTE — Hospital Course (Addendum)
27 year old male with a history of diverticulitis and nephrolithiasis presented with 2 days of abdominal pain.  Notably, the patient was admitted to [facility] regional hospital from 06/17/2022 to 06/19/2022 for acute diverticulitis. At [facility], the patient had a CT of the abdomen and pelvis reports showed marked inflammation of the upper anterior pelvis related to pronounced diverticulum extending caudally from the sigmoid colon.  There was no associated abscess formation noted at that time.  The patient was treated with intravenous antibiotics and discharged home with 10-day supply of Augmentin.  Notably, the patient did not have significant leukocytosis.  His WBC was 7.8 on 06/17/2022. Unfortunately, the patient developed worsening lower abdominal pain in the past 2 days; therefore he came to the emergency department for further evaluation and treatment. The patient complains of nausea but denies any vomiting.  He denies any fevers, chills, chest pain, shortness of breath, coughing, hemoptysis.  There is no hematochezia or melena.  There is no dysuria or hematuria.  Last BM on 6/14  Notably, the patient had a hospital admission at [facility] from 08/10/2021 to 08/13/2021 for acute diverticulitis with perforation.  The patient was treated medically.  It was felt that his abscess was too small for IR intervention.  He was treated with Zosyn and discharged home with Augmentin.  In the ED, the patient was afebrile hemodynamically stable. BMP showed a sodium 134, potassium 3.6, bicarbonate 24, serum creatinine 0.94.  LFTs were unremarkable.  Lipase 27.  WBC 8.2, hemoglobin 12.9, platelets 425,000.  UA was negative for pyuria.  CT of the abdomen pelvis showed inflammatory reaction in the pelvis with wall thickening involving 2 loops of redundant sigmoid colon.  There was some associated soft tissue infiltration with a 2 point centimeter diameter abscess.  The adjacent small bowel loops were also thickened which was felt to be  reactive.

## 2022-06-26 NOTE — Progress Notes (Signed)
Patient arrives on unit accompanied by ED staff. A&O x4, verbalizing understanding of POC and unit orientation. Independent of ADLs and mobility with complaints of abdominal pain 7/10. VSS, bed locked and lowered and call bell and necessities within reach. WCTM

## 2022-06-26 NOTE — Plan of Care (Signed)
  Problem: Clinical Measurements: Goal: Diagnostic test results will improve Outcome: Progressing   Problem: Nutrition: Goal: Adequate nutrition will be maintained Outcome: Progressing   Problem: Pain Managment: Goal: General experience of comfort will improve Outcome: Progressing   Problem: Skin Integrity: Goal: Risk for impaired skin integrity will decrease Outcome: Progressing   

## 2022-06-26 NOTE — Discharge Instructions (Signed)
  Providers Accepting New Patients in [place], [place]    [facility] [address]  [place], [place] [address] [phone] Accepts most insurances  [facility] [address] [place], [place] [address] [phone] Accepts most insurances  [facility] [place] [address] [place], [place] [address]  [phone] Must meet requirements  [facility] [address]  [place], [place] [address] [phone] Accepts most insurances  [facility] [address]  [place], [place] [address] [phone] Accepts most insurances  [facility] [address]   [place], [place]   [phone] Accepts most insurances  [facility] ([facility])  [address]  [place], [place] [address] [phone] Accepts most insurances     [facility] [address]  [place], [place] [address] [phone] Accepts most insurances  [facility] [address] [place], [place] [address] [phone] option 1 Accepts Medicaid and Uninsured  [facility] [address]  [place], [place] [address] [phone] Accepts most insurances  Tesfaye Fanta, MD [address]  [place], [place] [address] [phone] Accepts most insurances  [facility] [place] [address]  [place], [place] [address] [phone] Accepts most insurances  [facility] [address] [place], [place] [address] [phone] Accepts most insurances  Zack Hall, MD [address] [place], [place] [address] [phone]  Accepts most insurances                      

## 2022-06-27 DIAGNOSIS — K5720 Diverticulitis of large intestine with perforation and abscess without bleeding: Secondary | ICD-10-CM | POA: Diagnosis not present

## 2022-06-27 LAB — CBC
HCT: 38.6 % — ABNORMAL LOW (ref 39.0–52.0)
Hemoglobin: 13 g/dL (ref 13.0–17.0)
MCH: 30.1 pg (ref 26.0–34.0)
MCHC: 33.7 g/dL (ref 30.0–36.0)
MCV: 89.4 fL (ref 80.0–100.0)
Platelets: 410 10*3/uL — ABNORMAL HIGH (ref 150–400)
RBC: 4.32 MIL/uL (ref 4.22–5.81)
RDW: 11.9 % (ref 11.5–15.5)
WBC: 6 10*3/uL (ref 4.0–10.5)
nRBC: 0 % (ref 0.0–0.2)

## 2022-06-27 LAB — BASIC METABOLIC PANEL
Anion gap: 8 (ref 5–15)
BUN: 9 mg/dL (ref 6–20)
CO2: 27 mmol/L (ref 22–32)
Calcium: 8.8 mg/dL — ABNORMAL LOW (ref 8.9–10.3)
Chloride: 102 mmol/L (ref 98–111)
Creatinine, Ser: 0.99 mg/dL (ref 0.61–1.24)
GFR, Estimated: >60 mL/min (ref >60)
Glucose, Bld: 86 mg/dL (ref 70–99)
Potassium: 3.6 mmol/L (ref 3.5–5.1)
Sodium: 137 mmol/L (ref 135–145)

## 2022-06-27 LAB — CULTURE, BLOOD (ROUTINE X 2): Special Requests: ADEQUATE

## 2022-06-27 LAB — MAGNESIUM: Magnesium: 2.1 mg/dL (ref 1.7–2.4)

## 2022-06-27 NOTE — Progress Notes (Signed)
PROGRESS NOTE  Thomas Valdez:[id number] DOB: 12/24/1995 DOA: 06/25/2022 PCP: Daryll Drown, NP (Inactive)  Brief History:  27 year old male with a history of diverticulitis and nephrolithiasis presented with 2 days of abdominal pain.  Notably, the patient was admitted to [facility] regional hospital from 06/17/2022 to 06/19/2022 for acute diverticulitis. At [facility], the patient had a CT of the abdomen and pelvis reports showed marked inflammation of the upper anterior pelvis related to pronounced diverticulum extending caudally from the sigmoid colon.  There was no associated abscess formation noted at that time.  The patient was treated with intravenous antibiotics and discharged home with 10-day supply of Augmentin.  Notably, the patient did not have significant leukocytosis.  His WBC was 7.8 on 06/17/2022. Unfortunately, the patient developed worsening lower abdominal pain in the past 2 days; therefore he came to the emergency department for further evaluation and treatment. The patient complains of nausea but denies any vomiting.  He denies any fevers, chills, chest pain, shortness of breath, coughing, hemoptysis.  There is no hematochezia or melena.  There is no dysuria or hematuria.  Last BM on 6/14  Notably, the patient had a hospital admission at [facility] from 08/10/2021 to 08/13/2021 for acute diverticulitis with perforation.  The patient was treated medically.  It was felt that his abscess was too small for IR intervention.  He was treated with Zosyn and discharged home with Augmentin.  In the ED, the patient was afebrile hemodynamically stable. BMP showed a sodium 134, potassium 3.6, bicarbonate 24, serum creatinine 0.94.  LFTs were unremarkable.  Lipase 27.  WBC 8.2, hemoglobin 12.9, platelets 425,000.  UA was negative for pyuria.  CT of the abdomen pelvis showed inflammatory reaction in the pelvis with wall thickening involving 2 loops of redundant sigmoid colon.  There was some associated  soft tissue infiltration with a 2 point centimeter diameter abscess.  The adjacent small bowel loops were also thickened which was felt to be reactive.   Assessment/Plan: Acute diverticulitis of colon with perforation -06/25/22 CT abd/pelvis with 2.7 cm abscess as discussed above -general surgery consult -npo/bowel rest initially -6/16--advanced to full liquids -continue zosyn -continue IVF -continue prn oxycodone  Secondary Late Latent Syphillis -previously treated 10/2014 with Benzathine PCN x 3 -may continue to have low level positive RPR -no further treatment at this time   History of Gonorrhea -GC/chlamydia probe pending -follow up viral hepatitis panel/HIV             Family Communication:  noi Family at bedside   Consultants:  general surgery   Code Status:  FULL    DVT Prophylaxis:  [place] Heparin      Procedures: As Listed in Progress Note Above   Antibiotics: Zosyn 6/14>>    Subjective: Pt states abd pain is much improved.  Had 2 BMs last 24 hours.  Denies f/c, cp, sob, n/v/d  Objective: Vitals:   06/26/22 1202 06/26/22 2047 06/27/22 0457 06/27/22 1556  BP: 109/66 109/60 112/74 106/64  Pulse: 62 60 62 60  Resp: 20 18 16 16   Temp: 98.4 F (36.9 C) 98.6 F (37 C) 97.8 F (36.6 C) 98.4 F (36.9 C)  TempSrc: Oral Oral Oral Oral  SpO2: 100% 100% 100% 100%  Weight:   79.4 kg   Height:        Intake/Output Summary (Last 24 hours) at 06/27/2022 1621 Last data filed at 06/27/2022 8119 Gross per 24 hour  Intake  530 ml  Output 750 ml  Net -220 ml   Weight change: 1.361 kg Exam:  General:  Pt is alert, follows commands appropriately, not in acute distress HEENT: No icterus, No thrush, No neck mass, [place]/AT Cardiovascular: RRR, S1/S2, no rubs, no gallops Respiratory: CTA bilaterally, no wheezing, no crackles, no rhonchi Abdomen: Soft/+BS, mild lower tender, non distended, no guarding Extremities: No edema, No lymphangitis, No petechiae, No rashes, no  synovitis   Data Reviewed: I have personally reviewed following labs and imaging studies Basic Metabolic Panel: Recent Labs  Lab 06/25/22 1948 06/26/22 0448 06/27/22 0431  NA 134* 137 137  K 3.6 3.6 3.6  CL 101 103 102  CO2 24 25 27   GLUCOSE 95 90 86  BUN 11 9 9   CREATININE 0.94 0.90 0.99  CALCIUM 9.0 8.9 8.8*  MG  --   --  2.1   Liver Function Tests: Recent Labs  Lab 06/25/22 1948 06/26/22 0448  AST 19 15  ALT 17 16  ALKPHOS 59 59  BILITOT 0.6 0.5  PROT 8.3* 7.1  ALBUMIN 4.1 3.4*   Recent Labs  Lab 06/25/22 1948  LIPASE 27   No results for input(s): "AMMONIA" in the last 168 hours. Coagulation Profile: No results for input(s): "INR", "PROTIME" in the last 168 hours. CBC: Recent Labs  Lab 06/25/22 1948 06/26/22 0448 06/27/22 0643  WBC 8.2 7.5 6.0  HGB 12.9* 11.8* 13.0  HCT 38.5* 37.0* 38.6*  MCV 89.5 90.0 89.4  PLT 425* 139* 410*   Cardiac Enzymes: No results for input(s): "CKTOTAL", "CKMB", "CKMBINDEX", "TROPONINI" in the last 168 hours. BNP: Invalid input(s): "POCBNP" CBG: No results for input(s): "GLUCAP" in the last 168 hours. HbA1C: No results for input(s): "HGBA1C" in the last 72 hours. Urine analysis:    Component Value Date/Time   COLORURINE STRAW (A) 06/25/2022 2004   APPEARANCEUR CLEAR 06/25/2022 2004   LABSPEC 1.006 06/25/2022 2004   PHURINE 7.0 06/25/2022 2004   GLUCOSEU NEGATIVE 06/25/2022 2004   HGBUR NEGATIVE 06/25/2022 2004   BILIRUBINUR NEGATIVE 06/25/2022 2004   BILIRUBINUR negative 10/14/2021 1258   KETONESUR 5 (A) 06/25/2022 2004   PROTEINUR NEGATIVE 06/25/2022 2004   UROBILINOGEN 1.0 10/14/2021 1258   UROBILINOGEN 0.2 11/04/2014 1136   NITRITE NEGATIVE 06/25/2022 2004   LEUKOCYTESUR NEGATIVE 06/25/2022 2004   Sepsis Labs: @LABRCNTIP (procalcitonin:4,lacticidven:4) ) Recent Results (from the past 240 hour(s))  Blood culture (routine x 2)     Status: None (Preliminary result)   Collection Time: 06/25/22 10:35 PM    Specimen: Left Antecubital; Blood  Result Value Ref Range Status   Specimen Description LEFT ANTECUBITAL  Final   Special Requests   Final    BOTTLES DRAWN AEROBIC AND ANAEROBIC Blood Culture adequate volume   Culture   Final    NO GROWTH 2 DAYS Performed at [facility], [address] [place], [place] [address]    Report Status PENDING  Incomplete  Blood culture (routine x 2)     Status: None (Preliminary result)   Collection Time: 06/25/22 10:37 PM   Specimen: BLOOD LEFT HAND  Result Value Ref Range Status   Specimen Description BLOOD LEFT HAND  Final   Special Requests   Final    BOTTLES DRAWN AEROBIC AND ANAEROBIC Blood Culture adequate volume   Culture   Final    NO GROWTH 2 DAYS Performed at [facility], [address] [place], [place] [address]    Report Status PENDING  Incomplete  Scheduled Meds:  acetaminophen  1,000 mg Oral Q6H   feeding supplement  1 Container Oral TID BM   heparin  5,000 Units Subcutaneous Q8H   ketorolac  15 mg Intravenous Q6H   methocarbamol  500 mg Oral TID   senna-docusate  1 tablet Oral BID   sodium chloride flush  3 mL Intravenous Q12H   Continuous Infusions:  lactated ringers 100 mL/hr at 06/27/22 1503   piperacillin-tazobactam 3.375 g (06/27/22 1501)    Procedures/Studies: CT ABDOMEN PELVIS W CONTRAST  Result Date: 06/25/2022 CLINICAL DATA:  Diverticulitis with complication suspected. Abdominal pain. Hospitalized last week for a flare-up of diverticulitis. EXAM: CT ABDOMEN AND PELVIS WITH CONTRAST TECHNIQUE: Multidetector CT imaging of the abdomen and pelvis was performed using the standard protocol following bolus administration of intravenous contrast. RADIATION DOSE REDUCTION: This exam was performed according to the departmental dose-optimization program which includes automated exposure control, adjustment of the mA and/or kV according to patient size and/or use of iterative reconstruction technique. CONTRAST:  OMNIPAQUE  IOHEXOL 300 MG/ML  SOLN COMPARISON:  10/15/2021, 08/06/2021, and 07/14/2020 FINDINGS: Lower chest: Mild dependent atelectasis in the lung bases. Hepatobiliary: No focal liver abnormality is seen. No gallstones, gallbladder wall thickening, or biliary dilatation. Pancreas: Unremarkable. No pancreatic ductal dilatation or surrounding inflammatory changes. Spleen: Normal in size without focal abnormality. Adrenals/Urinary Tract: Adrenal glands are unremarkable. Kidneys are normal, without renal calculi, focal lesion, or hydronephrosis. Bladder is unremarkable. Stomach/Bowel: Stomach, small bowel, and colon are not abnormally distended. Inflammatory process in the pelvis with thick-walled segment of sigmoid colon likely representing diverticulitis. This involves to loops of redundant sigmoid colon. Inflammatory reaction in the adjacent pelvic soft tissues with loculated gas and fluid collection measuring about 2.7 cm diameter consistent with an abscess. Adjacent small bowel loops are also thick walled likely representing reactive inflammation. This is in the same area as previous CT scans have demonstrated inflammatory changes likely due to diverticulitis. Could also consider Crohn disease in the setting of recurrent infections. No proximal obstruction. Appendix is normal. Vascular/Lymphatic: No significant vascular findings are present. No enlarged abdominal or pelvic lymph nodes. Reproductive: Prostate is unremarkable. Other: Small amount of free fluid in the pelvis is likely reactive. No free air in the abdomen. Abdominal wall musculature appears intact. Musculoskeletal: No acute or significant osseous findings. IMPRESSION: 1. Inflammatory reaction in the pelvis with wall thickening involving 2 loops of redundant sigmoid colon. Associated soft tissue infiltration and a 2.7 cm diameter abscess are present. Adjacent small bowel loops are also thickened, likely reactive. Small amount of free fluid in the pelvis is also  likely reactive. Changes likely represent diverticulitis with diverticular abscess but could also consider Crohn's disease in the setting of recurrent inflammatory process. 2. No evidence of proximal bowel obstruction. 3. Appendix is normal. Electronically Signed   By: Burman Nieves M.D.   On: 06/25/2022 21:19    Catarina Hartshorn, DO  [facility]  If 7PM-7AM, please contact night-coverage [email or web address] Password [email or web address] 06/27/2022, 4:21 PM   LOS: 1 day

## 2022-06-27 NOTE — Progress Notes (Signed)
[facility] Progress Note     Subjective: Patient seen and examined.  He is resting comfortably in bed.  He is tolerating his clear liquids without nausea and vomiting.  He currently denies any abdominal pain.  He had multiple small looser bowel movements in the last 24 hours.  Objective: Vital signs in last 24 hours: Temp:  [97.8 F (36.6 C)-98.6 F (37 C)] 97.8 F (36.6 C) (06/16 0457) Pulse Rate:  [60-62] 62 (06/16 0457) Resp:  [16-20] 16 (06/16 0457) BP: (109-112)/(60-74) 112/74 (06/16 0457) SpO2:  [100 %] 100 % (06/16 0457) Weight:  [79.4 kg] 79.4 kg (06/16 0457) Last BM Date : 06/27/22  Intake/Output from previous day: 06/15 0701 - 06/16 0700 In: 1461.2 [P.O.:960; I.V.:371.7; IV Piggyback:129.6] Out: 750 [Urine:750] Intake/Output this shift: No intake/output data recorded.  General appearance: alert, cooperative, and no distress GI: Abdomen soft, nondistended, no percussion tenderness, nontender to palpation; no rigidity, guarding, rebound tenderness  Lab Results:  Recent Labs    06/26/22 0448 06/27/22 0643  WBC 7.5 6.0  HGB 11.8* 13.0  HCT 37.0* 38.6*  PLT 139* 410*   BMET Recent Labs    06/26/22 0448 06/27/22 0431  NA 137 137  K 3.6 3.6  CL 103 102  CO2 25 27  GLUCOSE 90 86  BUN 9 9  CREATININE 0.90 0.99  CALCIUM 8.9 8.8*   PT/INR No results for input(s): "LABPROT", "INR" in the last 72 hours.  Studies/Results: CT ABDOMEN PELVIS W CONTRAST  Result Date: 06/25/2022 CLINICAL DATA:  Diverticulitis with complication suspected. Abdominal pain. Hospitalized last week for a flare-up of diverticulitis. EXAM: CT ABDOMEN AND PELVIS WITH CONTRAST TECHNIQUE: Multidetector CT imaging of the abdomen and pelvis was performed using the standard protocol following bolus administration of intravenous contrast. RADIATION DOSE REDUCTION: This exam was performed according to the departmental dose-optimization program which includes automated exposure  control, adjustment of the mA and/or kV according to patient size and/or use of iterative reconstruction technique. CONTRAST:  OMNIPAQUE IOHEXOL 300 MG/ML  SOLN COMPARISON:  10/15/2021, 08/06/2021, and 07/14/2020 FINDINGS: Lower chest: Mild dependent atelectasis in the lung bases. Hepatobiliary: No focal liver abnormality is seen. No gallstones, gallbladder wall thickening, or biliary dilatation. Pancreas: Unremarkable. No pancreatic ductal dilatation or surrounding inflammatory changes. Spleen: Normal in size without focal abnormality. Adrenals/Urinary Tract: Adrenal glands are unremarkable. Kidneys are normal, without renal calculi, focal lesion, or hydronephrosis. Bladder is unremarkable. Stomach/Bowel: Stomach, small bowel, and colon are not abnormally distended. Inflammatory process in the pelvis with thick-walled segment of sigmoid colon likely representing diverticulitis. This involves to loops of redundant sigmoid colon. Inflammatory reaction in the adjacent pelvic soft tissues with loculated gas and fluid collection measuring about 2.7 cm diameter consistent with an abscess. Adjacent small bowel loops are also thick walled likely representing reactive inflammation. This is in the same area as previous CT scans have demonstrated inflammatory changes likely due to diverticulitis. Could also consider Crohn disease in the setting of recurrent infections. No proximal obstruction. Appendix is normal. Vascular/Lymphatic: No significant vascular findings are present. No enlarged abdominal or pelvic lymph nodes. Reproductive: Prostate is unremarkable. Other: Small amount of free fluid in the pelvis is likely reactive. No free air in the abdomen. Abdominal wall musculature appears intact. Musculoskeletal: No acute or significant osseous findings. IMPRESSION: 1. Inflammatory reaction in the pelvis with wall thickening involving 2 loops of redundant sigmoid colon. Associated soft tissue infiltration and a 2.7 cm  diameter abscess are present. Adjacent small bowel  loops are also thickened, likely reactive. Small amount of free fluid in the pelvis is also likely reactive. Changes likely represent diverticulitis with diverticular abscess but could also consider Crohn's disease in the setting of recurrent inflammatory process. 2. No evidence of proximal bowel obstruction. 3. Appendix is normal. Electronically Signed   By: Burman Nieves M.D.   On: 06/25/2022 21:19    Anti-infectives: Anti-infectives (From admission, onward)    Start     Dose/Rate Route Frequency Ordered Stop   06/26/22 0600  piperacillin-tazobactam (ZOSYN) IVPB 3.375 g        3.375 g 12.5 mL/hr over 240 Minutes Intravenous Every 8 hours 06/25/22 2330     06/25/22 2145  piperacillin-tazobactam (ZOSYN) IVPB 3.375 g        3.375 g 100 mL/hr over 30 Minutes Intravenous  Once 06/25/22 2136 06/25/22 2225   06/25/22 2130  cefTRIAXone (ROCEPHIN) 2 g in sodium chloride 0.9 % 100 mL IVPB  Status:  Discontinued        2 g 200 mL/hr over 30 Minutes Intravenous  Once 06/25/22 2129 06/25/22 2136   06/25/22 2130  metroNIDAZOLE (FLAGYL) IVPB 500 mg  Status:  Discontinued        500 mg 100 mL/hr over 60 Minutes Intravenous  Once 06/25/22 2129 06/25/22 2136       Assessment/Plan:  Patient is a 27 year old male who was admitted with recurrent diverticulitis and small diverticular abscess.  -Patient overall doing very well this morning-denies abdominal pain and tolerating clear liquids -Advance to full liquids.  If patient tolerates full liquids for lunch, may have soft diet for dinner -Continue IV Zosyn -Patient still without leukocytosis -IV fluids per primary team -Continue current pain regimen -PRN antiemetics -No acute surgical intervention at this time -Patient will need outpatient colonoscopy in 6 to 8 weeks, and will likely will need to undergo surgery in the future given his multiple recurrent episodes of diverticulitis -Appreciate  hospitalist recommendations   LOS: 1 day    Stevin Bielinski A Ples Trudel 06/27/2022

## 2022-06-28 DIAGNOSIS — K5720 Diverticulitis of large intestine with perforation and abscess without bleeding: Secondary | ICD-10-CM | POA: Diagnosis not present

## 2022-06-28 LAB — CBC
HCT: 36.7 % — ABNORMAL LOW (ref 39.0–52.0)
Hemoglobin: 12.2 g/dL — ABNORMAL LOW (ref 13.0–17.0)
MCH: 29.8 pg (ref 26.0–34.0)
MCHC: 33.2 g/dL (ref 30.0–36.0)
MCV: 89.5 fL (ref 80.0–100.0)
Platelets: 464 10*3/uL — ABNORMAL HIGH (ref 150–400)
RBC: 4.1 MIL/uL — ABNORMAL LOW (ref 4.22–5.81)
RDW: 11.7 % (ref 11.5–15.5)
WBC: 4.8 10*3/uL (ref 4.0–10.5)
nRBC: 0 % (ref 0.0–0.2)

## 2022-06-28 LAB — FLUORESCENT TREPONEMAL AB(FTA)-IGG-BLD: Fluorescent Treponemal Ab, IgG: REACTIVE — AB

## 2022-06-28 LAB — GC/CHLAMYDIA PROBE AMP (~~LOC~~) NOT AT ARMC
Chlamydia: NEGATIVE
Comment: NEGATIVE
Comment: NORMAL
Neisseria Gonorrhea: NEGATIVE

## 2022-06-28 LAB — CULTURE, BLOOD (ROUTINE X 2): Special Requests: ADEQUATE

## 2022-06-28 LAB — T.PALLIDUM AB, TOTAL: T Pallidum Abs: REACTIVE — AB

## 2022-06-28 MED ORDER — CIPROFLOXACIN HCL 500 MG PO TABS: 500.0000 mg | ORAL_TABLET | Freq: Two times a day (BID) | ORAL | 0 refills | Status: DC

## 2022-06-28 MED ORDER — CIPROFLOXACIN HCL 250 MG PO TABS: 500.0000 mg | ORAL_TABLET | Freq: Two times a day (BID) | ORAL | Status: DC

## 2022-06-28 MED ORDER — OXYCODONE HCL 5 MG PO TABS: 5.0000 mg | ORAL_TABLET | Freq: Four times a day (QID) | ORAL | 0 refills | Status: DC | PRN

## 2022-06-28 MED ORDER — METRONIDAZOLE 500 MG PO TABS: 500.0000 mg | ORAL_TABLET | Freq: Two times a day (BID) | ORAL | 0 refills | Status: DC

## 2022-06-28 MED ORDER — METRONIDAZOLE 500 MG PO TABS: 500.0000 mg | ORAL_TABLET | Freq: Two times a day (BID) | ORAL | Status: DC

## 2022-06-28 NOTE — Discharge Summary (Addendum)
Physician Discharge Summary   Patient: Thomas Valdez MRN: [id number] DOB: 1995-02-27  Admit date:     06/25/2022  Discharge date: 06/28/22  Discharge Physician: Onalee Hua Vanden Fawaz   PCP: Daryll Drown, NP (Inactive)   Recommendations at discharge:   Please follow up with primary care provider within 1-2 weeks  Please repeat BMP and CBC in one week     Hospital Course: 27 year old male with a history of diverticulitis and nephrolithiasis presented with 2 days of abdominal pain.  Notably, the patient was admitted to [facility] from 06/17/2022 to 06/19/2022 for acute diverticulitis. At [facility], the patient had a CT of the abdomen and pelvis reports showed marked inflammation of the upper anterior pelvis related to pronounced diverticulum extending caudally from the sigmoid colon.  There was no associated abscess formation noted at that time.  The patient was treated with intravenous antibiotics and discharged home with 10-day supply of Augmentin.  Notably, the patient did not have significant leukocytosis.  His WBC was 7.8 on 06/17/2022. Unfortunately, the patient developed worsening lower abdominal pain in the past 2 days; therefore he came to the emergency department for further evaluation and treatment. The patient complains of nausea but denies any vomiting.  He denies any fevers, chills, chest pain, shortness of breath, coughing, hemoptysis.  There is no hematochezia or melena.  There is no dysuria or hematuria.  Last BM on 6/14  Notably, the patient had a hospital admission at [facility] from 08/10/2021 to 08/13/2021 for acute diverticulitis with perforation.  The patient was treated medically.  It was felt that his abscess was too small for IR intervention.  He was treated with Zosyn and discharged home with Augmentin.  In the ED, the patient was afebrile hemodynamically stable. BMP showed a sodium 134, potassium 3.6, bicarbonate 24, serum creatinine 0.94.  LFTs were unremarkable.  Lipase 27.   WBC 8.2, hemoglobin 12.9, platelets 425,000.  UA was negative for pyuria.  CT of the abdomen pelvis showed inflammatory reaction in the pelvis with wall thickening involving 2 loops of redundant sigmoid colon.  There was some associated soft tissue infiltration with a 2 point centimeter diameter abscess.  The adjacent small bowel loops were also thickened which was felt to be reactive.  Assessment and Plan: Acute diverticulitis of colon with perforation -06/25/22 CT abd/pelvis with 2.7 cm abscess as discussed above -general surgery consult -npo/bowel rest initially -6/16--advanced to full liquids, then soft diet which pt tolerated -continued zosyn>> discharged home with Cipro and Flagyl for 7 additional days -continued IVF -continue prn oxycodone -outpt follow with GI Levon Hedger) and surgery ([name])   Secondary Late Latent Syphillis -previously treated 10/2014 with Benzathine PCN x 3 -may continue to have low level positive RPR -no further treatment at this time   History of Gonorrhea -GC/chlamydia probe pending -follow up viral hepatitis panel/HIV--neg           Consultants: gen surg Procedures performed: none  Disposition: Home Diet recommendation:  Regular diet DISCHARGE MEDICATION: Allergies as of 06/28/2022   No Known Allergies      Medication List     STOP taking these medications    amoxicillin-clavulanate 875-125 MG tablet Commonly known as: AUGMENTIN   ibuprofen 800 MG tablet Commonly known as: ADVIL   traZODone 50 MG tablet Commonly known as: DESYREL       TAKE these medications    acetaminophen 500 MG tablet Commonly known as: TYLENOL Take 2 tablets (1,000 mg total) by mouth every 6 (  six) hours.   ciprofloxacin 500 MG tablet Commonly known as: CIPRO Take 1 tablet (500 mg total) by mouth 2 (two) times daily.   DSS 100 MG Caps Take by mouth.   metroNIDAZOLE 500 MG tablet Commonly known as: FLAGYL Take 1 tablet (500 mg total) by mouth  every 12 (twelve) hours.   ondansetron 4 MG tablet Commonly known as: Zofran Take 1 tablet (4 mg total) by mouth every 8 (eight) hours as needed for nausea or vomiting.   oxyCODONE 5 MG immediate release tablet Commonly known as: Oxy IR/ROXICODONE Take 5 mg by mouth every 4 (four) hours as needed. What changed: Another medication with the same name was added. Make sure you understand how and when to take each.   oxyCODONE 5 MG immediate release tablet Commonly known as: Oxy IR/ROXICODONE Take 1 tablet (5 mg total) by mouth every 6 (six) hours as needed for moderate pain. What changed: You were already taking a medication with the same name, and this prescription was added. Make sure you understand how and when to take each.   valACYclovir 500 MG tablet Commonly known as: VALTREX Take 1 tablet (500 mg total) by mouth 2 (two) times daily.        Follow-up Information     Marguerita Merles, Reuel Boom, MD. Schedule an appointment as soon as possible for a visit.   Specialty: Gastroenterology Why: Schedule follow-up appointment for 1 month after discharge to discuss colonoscopy Contact information: [address] [place] [address] [phone]         [name], [name], DO. Call.   Specialty: General Surgery Why: As needed and to discuss surgery after colonoscopy Contact information: [address] [name] [address] [phone]                Discharge Exam: Filed Weights   06/26/22 0500 06/27/22 0457 06/28/22 0518  Weight: 79.5 kg 79.4 kg 78.8 kg   HEENT:  /AT, No thrush, no icterus CV:  RRR, no rub, no S3, no S4 Lung:  CTA, no wheeze, no rhonchi Abd:  soft/+BS, NT Ext:  No edema, no lymphangitis, no synovitis, no rash   Condition at discharge: stable  The results of significant diagnostics from this hospitalization (including imaging, microbiology, ancillary and laboratory) are listed below for reference.   Imaging  Studies: CT ABDOMEN PELVIS W CONTRAST  Result Date: 06/25/2022 CLINICAL DATA:  Diverticulitis with complication suspected. Abdominal pain. Hospitalized last week for a flare-up of diverticulitis. EXAM: CT ABDOMEN AND PELVIS WITH CONTRAST TECHNIQUE: Multidetector CT imaging of the abdomen and pelvis was performed using the standard protocol following bolus administration of intravenous contrast. RADIATION DOSE REDUCTION: This exam was performed according to the departmental dose-optimization program which includes automated exposure control, adjustment of the mA and/or kV according to patient size and/or use of iterative reconstruction technique. CONTRAST:  OMNIPAQUE IOHEXOL 300 MG/ML  SOLN COMPARISON:  10/15/2021, 08/06/2021, and 07/14/2020 FINDINGS: Lower chest: Mild dependent atelectasis in the lung bases. Hepatobiliary: No focal liver abnormality is seen. No gallstones, gallbladder wall thickening, or biliary dilatation. Pancreas: Unremarkable. No pancreatic ductal dilatation or surrounding inflammatory changes. Spleen: Normal in size without focal abnormality. Adrenals/Urinary Tract: Adrenal glands are unremarkable. Kidneys are normal, without renal calculi, focal lesion, or hydronephrosis. Bladder is unremarkable. Stomach/Bowel: Stomach, small bowel, and colon are not abnormally distended. Inflammatory process in the pelvis with thick-walled segment of sigmoid colon likely representing diverticulitis. This involves to loops of redundant sigmoid colon. Inflammatory  reaction in the adjacent pelvic soft tissues with loculated gas and fluid collection measuring about 2.7 cm diameter consistent with an abscess. Adjacent small bowel loops are also thick walled likely representing reactive inflammation. This is in the same area as previous CT scans have demonstrated inflammatory changes likely due to diverticulitis. Could also consider Crohn disease in the setting of recurrent infections. No proximal  obstruction. Appendix is normal. Vascular/Lymphatic: No significant vascular findings are present. No enlarged abdominal or pelvic lymph nodes. Reproductive: Prostate is unremarkable. Other: Small amount of free fluid in the pelvis is likely reactive. No free air in the abdomen. Abdominal wall musculature appears intact. Musculoskeletal: No acute or significant osseous findings. IMPRESSION: 1. Inflammatory reaction in the pelvis with wall thickening involving 2 loops of redundant sigmoid colon. Associated soft tissue infiltration and a 2.7 cm diameter abscess are present. Adjacent small bowel loops are also thickened, likely reactive. Small amount of free fluid in the pelvis is also likely reactive. Changes likely represent diverticulitis with diverticular abscess but could also consider Crohn's disease in the setting of recurrent inflammatory process. 2. No evidence of proximal bowel obstruction. 3. Appendix is normal. Electronically Signed   By: Burman Nieves M.D.   On: 06/25/2022 21:19    Microbiology: Results for orders placed or performed during the hospital encounter of 06/25/22  Blood culture (routine x 2)     Status: None (Preliminary result)   Collection Time: 06/25/22 10:35 PM   Specimen: Left Antecubital; Blood  Result Value Ref Range Status   Specimen Description LEFT ANTECUBITAL  Final   Special Requests   Final    BOTTLES DRAWN AEROBIC AND ANAEROBIC Blood Culture adequate volume   Culture   Final    NO GROWTH 3 DAYS Performed at [facility], [address] [place], [place] [address]    Report Status PENDING  Incomplete  Blood culture (routine x 2)     Status: None (Preliminary result)   Collection Time: 06/25/22 10:37 PM   Specimen: BLOOD LEFT HAND  Result Value Ref Range Status   Specimen Description BLOOD LEFT HAND  Final   Special Requests   Final    BOTTLES DRAWN AEROBIC AND ANAEROBIC Blood Culture adequate volume   Culture   Final    NO GROWTH 3 DAYS Performed at [facility], [address], [place], [place] [address]    Report Status PENDING  Incomplete    Labs: CBC: Recent Labs  Lab 06/25/22 1948 06/26/22 0448 06/27/22 0643 06/28/22 0509  WBC 8.2 7.5 6.0 4.8  HGB 12.9* 11.8* 13.0 12.2*  HCT 38.5* 37.0* 38.6* 36.7*  MCV 89.5 90.0 89.4 89.5  PLT 425* 139* 410* 464*   Basic Metabolic Panel: Recent Labs  Lab 06/25/22 1948 06/26/22 0448 06/27/22 0431  NA 134* 137 137  K 3.6 3.6 3.6  CL 101 103 102  CO2 24 25 27   GLUCOSE 95 90 86  BUN 11 9 9   CREATININE 0.94 0.90 0.99  CALCIUM 9.0 8.9 8.8*  MG  --   --  2.1   Liver Function Tests: Recent Labs  Lab 06/25/22 1948 06/26/22 0448  AST 19 15  ALT 17 16  ALKPHOS 59 59  BILITOT 0.6 0.5  PROT 8.3* 7.1  ALBUMIN 4.1 3.4*   CBG: No results for input(s): "GLUCAP" in the last 168 hours.  Discharge time spent: greater than 30 minutes.  Signed: Catarina Hartshorn, MD [facility] 06/28/2022

## 2022-06-28 NOTE — Progress Notes (Signed)
[facility] Progress Note     Subjective: Patient seen and examined.  He is sitting in chair by the window.  He is tolerating his diet without nausea and vomiting and moving his bowels without issue.  He does have intermittent abdominal pains, but is overall doing very well.  Objective: Vital signs in last 24 hours: Temp:  [97.6 F (36.4 C)-98.5 F (36.9 C)] 97.6 F (36.4 C) (06/17 0518) Pulse Rate:  [60-63] 61 (06/17 0518) Resp:  [16-18] 18 (06/17 0518) BP: (106-115)/(64-76) 114/71 (06/17 0518) SpO2:  [99 %-100 %] 99 % (06/17 0518) Weight:  [78.8 kg] 78.8 kg (06/17 0518) Last BM Date : 06/27/22  Intake/Output from previous day: 06/16 0701 - 06/17 0700 In: 750 [P.O.:600; IV Piggyback:150] Out: 1050 [Urine:1050] Intake/Output this shift: Total I/O In: -  Out: 300 [Urine:300]  General appearance: alert, cooperative, and no distress GI: Abdomen soft, nondistended, no percussion tenderness, nontender to palpation; no rigidity, guarding, rebound tenderness  Lab Results:  Recent Labs    06/27/22 0643 06/28/22 0509  WBC 6.0 4.8  HGB 13.0 12.2*  HCT 38.6* 36.7*  PLT 410* 464*   BMET Recent Labs    06/26/22 0448 06/27/22 0431  NA 137 137  K 3.6 3.6  CL 103 102  CO2 25 27  GLUCOSE 90 86  BUN 9 9  CREATININE 0.90 0.99  CALCIUM 8.9 8.8*   PT/INR No results for input(s): "LABPROT", "INR" in the last 72 hours.  Studies/Results: No results found.  Anti-infectives: Anti-infectives (From admission, onward)    Start     Dose/Rate Route Frequency Ordered Stop   06/26/22 0600  piperacillin-tazobactam (ZOSYN) IVPB 3.375 g        3.375 g 12.5 mL/hr over 240 Minutes Intravenous Every 8 hours 06/25/22 2330     06/25/22 2145  piperacillin-tazobactam (ZOSYN) IVPB 3.375 g        3.375 g 100 mL/hr over 30 Minutes Intravenous  Once 06/25/22 2136 06/25/22 2225   06/25/22 2130  cefTRIAXone (ROCEPHIN) 2 g in sodium chloride 0.9 % 100 mL IVPB  Status:   Discontinued        2 g 200 mL/hr over 30 Minutes Intravenous  Once 06/25/22 2129 06/25/22 2136   06/25/22 2130  metroNIDAZOLE (FLAGYL) IVPB 500 mg  Status:  Discontinued        500 mg 100 mL/hr over 60 Minutes Intravenous  Once 06/25/22 2129 06/25/22 2136       Assessment/Plan:  Patient is a 27 year old male who is admitted with recurrent diverticulitis and small diverticular abscess.  -Patient's pain is well-controlled and he is tolerating a regular diet -Continue soft diet -Continue IV Zosyn while inpatient -Still no leukocytosis -Continue current pain regimen.  Would recommend discharge home with prescription for Robaxin -PRN antiemetics -No surgical intervention at this time -Recommend outpatient colonoscopy in 6 to 8 weeks -Patient stable for discharge from general surgery standpoint.  Will touch base with pharmacy regarding antibiotics at discharge given that he failed outpatient management with 6 days of treatment with Augmentin -Appreciate hospitalist recommendations   LOS: 2 days    Dixie Coppa A Avishai Reihl 06/28/2022

## 2022-06-30 LAB — CULTURE, BLOOD (ROUTINE X 2): Culture: NO GROWTH

## 2022-07-09 ENCOUNTER — Telehealth: Payer: Federal, State, Local not specified - PPO | Admitting: Physician Assistant

## 2022-07-09 DIAGNOSIS — A6001 Herpesviral infection of penis: Secondary | ICD-10-CM

## 2022-07-09 MED ORDER — VALACYCLOVIR HCL 500 MG PO TABS
500.0000 mg | ORAL_TABLET | Freq: Two times a day (BID) | ORAL | 0 refills | Status: DC
Start: 2022-07-09 — End: 2022-10-18

## 2022-07-09 NOTE — Progress Notes (Signed)
I have spent 5 minutes in review of e-visit questionnaire, review and updating patient chart, medical decision making and response to patient.   Natnael Biederman Cody Mavrik Bynum, PA-C    

## 2022-07-09 NOTE — Progress Notes (Signed)
E-Visit for Herpes Simplex  We are sorry that you are not feeling well.  Here is how we plan to help!  Based on what you have shared ith me, it looks like you may be having an outbreak/flare-up of genital herpes.    I have prescribed I have prescribed Valacyclovir 500 mg Take one by mouth twice a day for 3 days.    If you have been prescribed long term medications to be taken on a regular basis, it is important to follow the recommendations and take them as ordered.    Outbreaks usually include blisters and open sores in the genital area. Outbreaks that happen after the first time are usually not as severe and do not last as long. Genital Herpes Simplex is a commonly sexually transmitted viral infection that is found worldwide. Most of these genital infections are caused by one or two herpes simplex viruses that is passed from person to person during vaginal, oral, or anal sex. Sometimes, people do not know they have herpes because they do not have any symptoms.  Please be aware that if you have genital herpes you can be contagious even when you are not having rash or flare-up and you may not have any symptoms, even when you are taking suppressive medicines.  Herpes cannot be cured. The disease usually causes most problems during the first few years. After that, the virus is still there, but it causes few to no symptoms. Even when the virus is active, people with herpes can take medicines to reduce and help prevent symptoms.  Herpes is an infection that can cause blisters and open sores on the genital area. Herpes is caused by a virus that is passed from person to person during vaginal, oral, or anal sex. Sometimes, people do not know they have herpes because they do not have any symptoms. Herpes cannot be cured. The disease usually causes most problems during the first few years. After that, the virus is still there, but it causes few to no symptoms. Even when the virus is active, people with herpes  can take medicines to reduce and help prevent symptoms.  If you have been prescribed medications to be taken on a regular basis, it is important to follow the recommendations and take them as ordered.  Some people with herpes never have any symptoms. But other people can develop symptoms within a few weeks of being infected with the herpes virus   Symptoms usually include blisters in the genital area. In women, this area includes the vagina, buttocks, anus, or thighs. In men, this area includes the penis, scrotum, anus, butt, or thighs. The blisters can become painful open sores, which then crust over as they heal. Sometimes, people can have other symptoms that include:  ?Blisters on the mouth or lips ?Fever, headache, or pain in the joints ?Trouble urinating  Outbreaks might occur every month or more often, or just once or twice a year. Sometimes, people can tell when an outbreak will occur, because they feel itching or pain beforehand. Sometimes they do not know that an outbreak is coming because they have no symptoms. Whatever your pattern is, keep in mind that herpes outbreaks usually become less frequent over time as you get older. Certain things, called "triggers," can make outbreaks more likely to occur. These include stress, sunlight, menstrual periods,or getting sick.  Antiviral therapy can shorten the duration of symptoms and signs in primary infection, which, when untreated, can be associated with significant increase in the   symptoms of the disease.  HOME CARE Use a portable bath (such as a "Sitz bath") where you can sit in warm water for about 20 minutes. Your bathtub could also work. Avoid bubble baths.  Keep the genital area clean and dry and avoid tight clothes.  Take over-the-counter pain medicine such as acetaminophen (brand name: Tylenol) or ibuprofen sample brand names: Advil, Motrin). But avoid aspirin.  Only take medications as instructed by your medical team.  You are  most likely to spread herpes to a sex partner when you have blisters and open sores on your body. But it's also possible to spread herpes to your partner when you do not have any symptoms. That is because herpes can be present on your body without causing any symptoms, like blisters or pain.  Telling your sex partner that you have herpes can be hard. But it can help protect them, since there are ways to lower the risk of spreading the infection.   Using a condom every time you have sex  Not having sex when you have symptoms  Not having oral sex if you have blisters or open sores (in the genital area or around your mouth)  MAKE SURE YOU   Understand these instructions. Do not have sex without using a condom until you have been seen by a doctor and as instructed by the provider If you are not better or improved within 7 days, you MUST have a follow up at your doctor or the health department for evaluation. There are other causes of rashes in the genital region.  Thank you for choosing an e-visit.  Your e-visit answers were reviewed by a board certified advanced clinical practitioner to complete your personal care plan. Depending upon the condition, your plan could have included both over the counter or prescription medications.  Please review your pharmacy choice. Make sure the pharmacy is open so you can pick up prescription now. If there is a problem, you may contact your provider through MyChart messaging and have the prescription routed to another pharmacy.  Your safety is important to us. If you have drug allergies check your prescription carefully.   For the next 24 hours you can use MyChart to ask questions about today's visit, request a non-urgent call back, or ask for a work or school excuse. You will get an email in the next two days asking about your experience. I hope that your e-visit has been valuable and will speed your recovery.      

## 2022-08-04 ENCOUNTER — Ambulatory Visit (INDEPENDENT_AMBULATORY_CARE_PROVIDER_SITE_OTHER): Payer: Federal, State, Local not specified - PPO | Admitting: Gastroenterology

## 2022-08-04 ENCOUNTER — Encounter: Payer: Self-pay | Admitting: Gastroenterology

## 2022-08-04 ENCOUNTER — Telehealth: Payer: Self-pay | Admitting: *Deleted

## 2022-08-04 VITALS — BP 115/73 | HR 66 | Temp 99.0°F | Ht 71.0 in | Wt 172.6 lb

## 2022-08-04 DIAGNOSIS — K5720 Diverticulitis of large intestine with perforation and abscess without bleeding: Secondary | ICD-10-CM | POA: Diagnosis not present

## 2022-08-04 DIAGNOSIS — R933 Abnormal findings on diagnostic imaging of other parts of digestive tract: Secondary | ICD-10-CM | POA: Insufficient documentation

## 2022-08-04 NOTE — Progress Notes (Signed)
GI Office Note    Referring Provider: No ref. provider found Primary Care Physician:  Daryll Drown, NP (Inactive)  Primary Gastroenterologist: not established  Chief Complaint   Chief Complaint  Patient presents with   Follow-up    Here for follow up from diverticulitis flare up, states it was his third one. Doing better, has been watching what he eats.     History of Present Illness   Thomas Valdez is a 27 y.o. male presenting today for further evaluation of diverticulitis, in need of a colonoscopy. He has history of recurrent complicated diverticulitis and needs his colon evaluated prior to consideration of elective surgery.   Patient has been having stomach issues for years but until 07/2020 had never been evaluated for it. In 07/2020 presented with right sided abdominal pain, CT done to rule out appendicitis but noted to have segmental wall thickening with mild pericolonic inflammatory changes involving proximal sigmoid colon. He was treated for possible diverticulitis at that time. One year later, he was admitted in 07/2021 with sigmoid diverticulitis complicated by abscess, to small for IR intervention.  He had another abnormal sigmoid colon on 10/2021 CT.   Admitted to [facility] June 17, 2022 through June 18, 2020 for for acute diverticulitis.  CT showed marked inflammation of the upper anterior pelvis related to pronounced diverticulum extending caudally from the sigmoid colon, size of this single diverticulum could reflect contained microperforation however, no associated abscess at that time.  Treated with IV antibiotics and discharged home with 10-day supply of Augmentin.   Readmitted at [facility] with ongoing abdominal pain. CT June 25, 2022 with 2.7 cm abscess, IV Zosyn, discharged on Cipro and Flagyl.  Today: doing well. No further abdominal pain or back pain. He is being very careful with his diet. Avoiding fried, heavy foods. Not eating past 9pm. Sticking  with smaller meals. BM regular. No melena, brbpr. No N/V. Several years ago, he notes that he had daily N/V and just thought it was normal or reflux.   CT abdomen pelvis with contrast June 25, 2022 IMPRESSION: 1. Inflammatory reaction in the pelvis with wall thickening involving 2 loops of redundant sigmoid colon. Associated soft tissue infiltration and a 2.7 cm diameter abscess are present. Adjacent small bowel loops are also thickened, likely reactive. Small amount of free fluid in the pelvis is also likely reactive. Changes likely represent diverticulitis with diverticular abscess but could also consider Crohn's disease in the setting of recurrent inflammatory process. 2. No evidence of proximal bowel obstruction. 3. Appendix is normal.   Medications   Current Outpatient Medications  Medication Sig Dispense Refill   acetaminophen (TYLENOL) 500 MG tablet Take 2 tablets (1,000 mg total) by mouth every 6 (six) hours. 30 tablet 0   valACYclovir (VALTREX) 500 MG tablet Take 1 tablet (500 mg total) by mouth 2 (two) times daily. (Patient not taking: Reported on 08/04/2022) 6 tablet 0   No current facility-administered medications for this visit.    Allergies   Allergies as of 08/04/2022   (No Known Allergies)    Past Medical History   Past Medical History:  Diagnosis Date   Diverticulitis    Syphilis    per patient treated at age 62    Past Surgical History   Past Surgical History:  Procedure Laterality Date   none      Past Family History   Family History  Problem Relation Age of Onset   Cancer Father  prostate   Heart disease Maternal Grandmother    Inflammatory bowel disease Neg Hx    Colon cancer Neg Hx     Past Social History   Social History   Socioeconomic History   Marital status: Single    Spouse name: Not on file   Number of children: Not on file   Years of education: Not on file   Highest education level: Not on file  Occupational  History   Not on file  Tobacco Use   Smoking status: Never   Smokeless tobacco: Never  Vaping Use   Vaping status: Never Used  Substance and Sexual Activity   Alcohol use: Yes    Comment: social   Drug use: Never   Sexual activity: Yes  Other Topics Concern   Not on file  Social History Narrative   Not on file   Social Determinants of Health   Financial Resource Strain: Low Risk  (06/23/2022)   Received from [facility], [facility]   Overall Financial Resource Strain (CARDIA)    Difficulty of Paying Living Expenses: Not hard at all  Food Insecurity: No Food Insecurity (06/26/2022)   Hunger Vital Sign    Worried About Running Out of Food in the Last Year: Never true    Ran Out of Food in the Last Year: Never true  Transportation Needs: No Transportation Needs (06/26/2022)   PRAPARE - Administrator, Civil Service (Medical): No    Lack of Transportation (Non-Medical): No  Physical Activity: Not on file  Stress: Not on file  Social Connections: Not on file  Intimate Partner Violence: Not At Risk (06/26/2022)   Humiliation, Afraid, Rape, and Kick questionnaire    Fear of Current or Ex-Partner: No    Emotionally Abused: No    Physically Abused: No    Sexually Abused: No    Review of Systems   General: Negative for anorexia, weight loss, fever, chills, fatigue, weakness. Eyes: Negative for vision changes.  ENT: Negative for hoarseness, difficulty swallowing , nasal congestion. CV: Negative for chest pain, angina, palpitations, dyspnea on exertion, peripheral edema.  Respiratory: Negative for dyspnea at rest, dyspnea on exertion, cough, sputum, wheezing.  GI: See history of present illness. GU:  Negative for dysuria, hematuria, urinary incontinence, urinary frequency, nocturnal urination.  MS: Negative for joint pain, low back pain.  Derm: Negative for rash or itching.  Neuro: Negative for weakness, abnormal sensation, seizure,  frequent headaches, memory loss,  confusion.  Psych: Negative for anxiety, depression, suicidal ideation, hallucinations.  Endo: Negative for unusual weight change.  Heme: Negative for bruising or bleeding. Allergy: Negative for rash or hives.  Physical Exam   BP 115/73 (BP Location: Right Arm, Patient Position: Sitting, Cuff Size: Normal)   Pulse 66   Temp 99 F (37.2 C) (Oral)   Ht 5\' 11"  (1.803 m)   Wt 172 lb 9.6 oz (78.3 kg)   SpO2 98%   BMI 24.07 kg/m    General: Well-nourished, well-developed in no acute distress.  Head: Normocephalic, atraumatic.   Eyes: Conjunctiva pink, no icterus. Mouth: Oropharyngeal mucosa moist and pink   Neck: Supple without thyromegaly, masses, or lymphadenopathy.  Lungs: Clear to auscultation bilaterally.  Heart: Regular rate and rhythm, no murmurs rubs or gallops.  Abdomen: Bowel sounds are normal, nontender, nondistended, no hepatosplenomegaly or masses,  no abdominal bruits or hernia, no rebound or guarding.   Rectal: not performed Extremities: No lower extremity edema. No clubbing  or deformities.  Neuro: Alert and oriented x 4 , grossly normal neurologically.  Skin: Warm and dry, no rash or jaundice.   Psych: Alert and cooperative, normal mood and affect.  Labs   Lab Results  Component Value Date   NA 137 06/27/2022   CL 102 06/27/2022   K 3.6 06/27/2022   CO2 27 06/27/2022   BUN 9 06/27/2022   CREATININE 0.99 06/27/2022   GFRNONAA >60 06/27/2022   CALCIUM 8.8 (L) 06/27/2022   PHOS 4.9 (H) 08/13/2021   ALBUMIN 3.4 (L) 06/26/2022   GLUCOSE 86 06/27/2022   Lab Results  Component Value Date   ALT 16 06/26/2022   AST 15 06/26/2022   ALKPHOS 59 06/26/2022   BILITOT 0.5 06/26/2022   Lab Results  Component Value Date   WBC 4.8 06/28/2022   HGB 12.2 (L) 06/28/2022   HCT 36.7 (L) 06/28/2022   MCV 89.5 06/28/2022   PLT 464 (H) 06/28/2022    Imaging Studies   No results found.  Assessment   *Complicated  diverticulitis *Abnormal colon on CT  Several episodes of complicated diverticulitis with abscess formation as outlined. He has had abnormal sigmoid colon on numerous CTs dating back to 2022. He needs colonoscopy at this time to rule out underlying pathology prior to proceeding with elective surgery. Per surgery, wait 6-8 weeks after recent admission before colonoscopy.     PLAN   Colonoscopy after first week of August. ASA 1.  I have discussed the risks, alternatives, benefits with regards to but not limited to the risk of reaction to medication, bleeding, infection, perforation and the patient is agreeable to proceed. Written consent to be obtained.  He has been instructed to call if he has any recurrent abdominal pain.    Leanna Battles. Melvyn Neth, MHS, PA-C [facility]

## 2022-08-04 NOTE — Telephone Encounter (Signed)
LMOVM to call back to schedule colonoscopy after 1st week of August with any provider per encounter, ASA 1

## 2022-08-04 NOTE — H&P (View-Only) (Signed)
 GI Office Note    Referring Provider: No ref. provider found Primary Care Physician:  Daryll Drown, NP (Inactive)  Primary Gastroenterologist: not established  Chief Complaint   Chief Complaint  Patient presents with   Follow-up    Here for follow up from diverticulitis flare up, states it was his third one. Doing better, has been watching what he eats.     History of Present Illness   Thomas Valdez is a 27 y.o. male presenting today for further evaluation of diverticulitis, in need of a colonoscopy. He has history of recurrent complicated diverticulitis and needs his colon evaluated prior to consideration of elective surgery.   Patient has been having stomach issues for years but until 07/2020 had never been evaluated for it. In 07/2020 presented with right sided abdominal pain, CT done to rule out appendicitis but noted to have segmental wall thickening with mild pericolonic inflammatory changes involving proximal sigmoid colon. He was treated for possible diverticulitis at that time. One year later, he was admitted in 07/2021 with sigmoid diverticulitis complicated by abscess, to small for IR intervention.  He had another abnormal sigmoid colon on 10/2021 CT.   Admitted to [facility] June 17, 2022 through June 18, 2020 for for acute diverticulitis.  CT showed marked inflammation of the upper anterior pelvis related to pronounced diverticulum extending caudally from the sigmoid colon, size of this single diverticulum could reflect contained microperforation however, no associated abscess at that time.  Treated with IV antibiotics and discharged home with 10-day supply of Augmentin.   Readmitted at [facility] with ongoing abdominal pain. CT June 25, 2022 with 2.7 cm abscess, IV Zosyn, discharged on Cipro and Flagyl.  Today: doing well. No further abdominal pain or back pain. He is being very careful with his diet. Avoiding fried, heavy foods. Not eating past 9pm. Sticking  with smaller meals. BM regular. No melena, brbpr. No N/V. Several years ago, he notes that he had daily N/V and just thought it was normal or reflux.   CT abdomen pelvis with contrast June 25, 2022 IMPRESSION: 1. Inflammatory reaction in the pelvis with wall thickening involving 2 loops of redundant sigmoid colon. Associated soft tissue infiltration and a 2.7 cm diameter abscess are present. Adjacent small bowel loops are also thickened, likely reactive. Small amount of free fluid in the pelvis is also likely reactive. Changes likely represent diverticulitis with diverticular abscess but could also consider Crohn's disease in the setting of recurrent inflammatory process. 2. No evidence of proximal bowel obstruction. 3. Appendix is normal.   Medications   Current Outpatient Medications  Medication Sig Dispense Refill   acetaminophen (TYLENOL) 500 MG tablet Take 2 tablets (1,000 mg total) by mouth every 6 (six) hours. 30 tablet 0   valACYclovir (VALTREX) 500 MG tablet Take 1 tablet (500 mg total) by mouth 2 (two) times daily. (Patient not taking: Reported on 08/04/2022) 6 tablet 0   No current facility-administered medications for this visit.    Allergies   Allergies as of 08/04/2022   (No Known Allergies)    Past Medical History   Past Medical History:  Diagnosis Date   Diverticulitis    Syphilis    per patient treated at age 62    Past Surgical History   Past Surgical History:  Procedure Laterality Date   none      Past Family History   Family History  Problem Relation Age of Onset   Cancer Father  prostate   Heart disease Maternal Grandmother    Inflammatory bowel disease Neg Hx    Colon cancer Neg Hx     Past Social History   Social History   Socioeconomic History   Marital status: Single    Spouse name: Not on file   Number of children: Not on file   Years of education: Not on file   Highest education level: Not on file  Occupational  History   Not on file  Tobacco Use   Smoking status: Never   Smokeless tobacco: Never  Vaping Use   Vaping status: Never Used  Substance and Sexual Activity   Alcohol use: Yes    Comment: social   Drug use: Never   Sexual activity: Yes  Other Topics Concern   Not on file  Social History Narrative   Not on file   Social Determinants of Health   Financial Resource Strain: Low Risk  (06/23/2022)   Received from [facility]   Overall Financial Resource Strain (CARDIA)    Difficulty of Paying Living Expenses: Not hard at all  Food Insecurity: No Food Insecurity (06/26/2022)   Hunger Vital Sign    Worried About Running Out of Food in the Last Year: Never true    Ran Out of Food in the Last Year: Never true  Transportation Needs: No Transportation Needs (06/26/2022)   PRAPARE - Administrator, Civil Service (Medical): No    Lack of Transportation (Non-Medical): No  Physical Activity: Not on file  Stress: Not on file  Social Connections: Not on file  Intimate Partner Violence: Not At Risk (06/26/2022)   Humiliation, Afraid, Rape, and Kick questionnaire    Fear of Current or Ex-Partner: No    Emotionally Abused: No    Physically Abused: No    Sexually Abused: No    Review of Systems   General: Negative for anorexia, weight loss, fever, chills, fatigue, weakness. Eyes: Negative for vision changes.  ENT: Negative for hoarseness, difficulty swallowing , nasal congestion. CV: Negative for chest pain, angina, palpitations, dyspnea on exertion, peripheral edema.  Respiratory: Negative for dyspnea at rest, dyspnea on exertion, cough, sputum, wheezing.  GI: See history of present illness. GU:  Negative for dysuria, hematuria, urinary incontinence, urinary frequency, nocturnal urination.  MS: Negative for joint pain, low back pain.  Derm: Negative for rash or itching.  Neuro: Negative for weakness, abnormal sensation, seizure,  frequent headaches, memory loss,  confusion.  Psych: Negative for anxiety, depression, suicidal ideation, hallucinations.  Endo: Negative for unusual weight change.  Heme: Negative for bruising or bleeding. Allergy: Negative for rash or hives.  Physical Exam   BP 115/73 (BP Location: Right Arm, Patient Position: Sitting, Cuff Size: Normal)   Pulse 66   Temp 99 F (37.2 C) (Oral)   Ht 5\' 11"  (1.803 m)   Wt 172 lb 9.6 oz (78.3 kg)   SpO2 98%   BMI 24.07 kg/m    General: Well-nourished, well-developed in no acute distress.  Head: Normocephalic, atraumatic.   Eyes: Conjunctiva pink, no icterus. Mouth: Oropharyngeal mucosa moist and pink   Neck: Supple without thyromegaly, masses, or lymphadenopathy.  Lungs: Clear to auscultation bilaterally.  Heart: Regular rate and rhythm, no murmurs rubs or gallops.  Abdomen: Bowel sounds are normal, nontender, nondistended, no hepatosplenomegaly or masses,  no abdominal bruits or hernia, no rebound or guarding.   Rectal: not performed Extremities: No lower extremity edema. No clubbing  or deformities.  Neuro: Alert and oriented x 4 , grossly normal neurologically.  Skin: Warm and dry, no rash or jaundice.   Psych: Alert and cooperative, normal mood and affect.  Labs   Lab Results  Component Value Date   NA 137 06/27/2022   CL 102 06/27/2022   K 3.6 06/27/2022   CO2 27 06/27/2022   BUN 9 06/27/2022   CREATININE 0.99 06/27/2022   GFRNONAA >60 06/27/2022   CALCIUM 8.8 (L) 06/27/2022   PHOS 4.9 (H) 08/13/2021   ALBUMIN 3.4 (L) 06/26/2022   GLUCOSE 86 06/27/2022   Lab Results  Component Value Date   ALT 16 06/26/2022   AST 15 06/26/2022   ALKPHOS 59 06/26/2022   BILITOT 0.5 06/26/2022   Lab Results  Component Value Date   WBC 4.8 06/28/2022   HGB 12.2 (L) 06/28/2022   HCT 36.7 (L) 06/28/2022   MCV 89.5 06/28/2022   PLT 464 (H) 06/28/2022    Imaging Studies   No results found.  Assessment   *Complicated  diverticulitis *Abnormal colon on CT  Several episodes of complicated diverticulitis with abscess formation as outlined. He has had abnormal sigmoid colon on numerous CTs dating back to 2022. He needs colonoscopy at this time to rule out underlying pathology prior to proceeding with elective surgery. Per surgery, wait 6-8 weeks after recent admission before colonoscopy.     PLAN   Colonoscopy after first week of August. ASA 1.  I have discussed the risks, alternatives, benefits with regards to but not limited to the risk of reaction to medication, bleeding, infection, perforation and the patient is agreeable to proceed. Written consent to be obtained.  He has been instructed to call if he has any recurrent abdominal pain.    Leanna Battles. Melvyn Neth, MHS, PA-C [facility]

## 2022-08-04 NOTE — Patient Instructions (Addendum)
Continue eating healthy, getting plenty of fiber in your diet.  Plan for colonoscopy 6-8 weeks after your last hospitalization per Dr. Robyne Peers.  If you have flare of symptoms before upcoming colonoscopy, please let us know!

## 2022-08-05 ENCOUNTER — Other Ambulatory Visit: Payer: Self-pay | Admitting: *Deleted

## 2022-08-05 ENCOUNTER — Encounter: Payer: Self-pay | Admitting: *Deleted

## 2022-08-05 MED ORDER — PEG 3350-KCL-NA BICARB-NACL 420 G PO SOLR: 4000.0000 mL | Freq: Once | ORAL | 0 refills | Status: AC

## 2022-08-05 NOTE — Telephone Encounter (Signed)
Pt has been scheduled for 08/23/22. Instructions sent via MyChart and prep sent to the pharmacy.

## 2022-08-23 ENCOUNTER — Ambulatory Visit (HOSPITAL_COMMUNITY): Payer: Self-pay | Admitting: Anesthesiology

## 2022-08-23 ENCOUNTER — Other Ambulatory Visit: Payer: Self-pay

## 2022-08-23 ENCOUNTER — Encounter (HOSPITAL_COMMUNITY): Payer: Self-pay | Admitting: Internal Medicine

## 2022-08-23 ENCOUNTER — Encounter (HOSPITAL_COMMUNITY)
Admission: RE | Disposition: A | Discharge status: Home / Self Care | Payer: Self-pay | Source: Home / Self Care | Attending: Internal Medicine

## 2022-08-23 ENCOUNTER — Ambulatory Visit (HOSPITAL_COMMUNITY)
Admission: RE | Admit: 2022-08-23 | Discharge: 2022-08-23 | Disposition: A | Discharge status: Home / Self Care | Payer: Federal, State, Local not specified - PPO | Attending: Internal Medicine | Admitting: Internal Medicine

## 2022-08-23 ENCOUNTER — Ambulatory Visit (HOSPITAL_COMMUNITY): Payer: Federal, State, Local not specified - PPO | Admitting: Anesthesiology

## 2022-08-23 DIAGNOSIS — K6389 Other specified diseases of intestine: Secondary | ICD-10-CM | POA: Diagnosis not present

## 2022-08-23 DIAGNOSIS — Q438 Other specified congenital malformations of intestine: Secondary | ICD-10-CM | POA: Insufficient documentation

## 2022-08-23 DIAGNOSIS — R933 Abnormal findings on diagnostic imaging of other parts of digestive tract: Secondary | ICD-10-CM | POA: Diagnosis not present

## 2022-08-23 DIAGNOSIS — K5720 Diverticulitis of large intestine with perforation and abscess without bleeding: Secondary | ICD-10-CM

## 2022-08-23 DIAGNOSIS — D123 Benign neoplasm of transverse colon: Secondary | ICD-10-CM

## 2022-08-23 DIAGNOSIS — K635 Polyp of colon: Secondary | ICD-10-CM | POA: Insufficient documentation

## 2022-08-23 DIAGNOSIS — K5730 Diverticulosis of large intestine without perforation or abscess without bleeding: Secondary | ICD-10-CM | POA: Diagnosis not present

## 2022-08-23 HISTORY — PX: COLONOSCOPY WITH PROPOFOL: SHX5780

## 2022-08-23 HISTORY — PX: POLYPECTOMY: SHX5525

## 2022-08-23 SURGERY — COLONOSCOPY WITH PROPOFOL
Anesthesia: General

## 2022-08-23 MED ORDER — LACTATED RINGERS IV SOLN
INTRAVENOUS | Status: DC
  Administered 2022-08-23: 1000 mL via INTRAVENOUS

## 2022-08-23 MED ORDER — PROPOFOL 10 MG/ML IV BOLUS
INTRAVENOUS | Status: DC | PRN
Start: 2022-08-23 — End: 2022-08-23
  Administered 2022-08-23: 50 mg via INTRAVENOUS
  Administered 2022-08-23: 100 mg via INTRAVENOUS

## 2022-08-23 MED ORDER — DEXMEDETOMIDINE HCL IN NACL 80 MCG/20ML IV SOLN
INTRAVENOUS | Status: DC | PRN
  Administered 2022-08-23: 8 ug via INTRAVENOUS
  Administered 2022-08-23: 12 ug via INTRAVENOUS

## 2022-08-23 MED ORDER — LIDOCAINE HCL (CARDIAC) PF 100 MG/5ML IV SOSY
PREFILLED_SYRINGE | INTRAVENOUS | Status: DC | PRN
  Administered 2022-08-23: 50 mg via INTRAVENOUS

## 2022-08-23 MED ORDER — PROPOFOL 500 MG/50ML IV EMUL
INTRAVENOUS | Status: DC | PRN
  Administered 2022-08-23: 250 ug/kg/min via INTRAVENOUS

## 2022-08-23 NOTE — Anesthesia Procedure Notes (Addendum)
Date/Time: 08/23/2022 12:44 PM  Performed by: Julian Reil, CRNAPre-anesthesia Checklist: Patient identified, Emergency Drugs available, Suction available and Patient being monitored Patient Re-evaluated:Patient Re-evaluated prior to induction Oxygen Delivery Method: Nasal cannula Induction Type: IV induction Placement Confirmation: positive ETCO2

## 2022-08-23 NOTE — Anesthesia Preprocedure Evaluation (Addendum)
Anesthesia Evaluation  Patient identified by MRN, date of birth, ID band Patient awake    Reviewed: Allergy & Precautions, H&P , NPO status , Patient's Chart, lab work & pertinent test results  Airway Mallampati: II  TM Distance: >3 FB Neck ROM: Full    Dental  (+) Dental Advisory Given   Pulmonary neg pulmonary ROS   Pulmonary exam normal breath sounds clear to auscultation       Cardiovascular negative cardio ROS Normal cardiovascular exam Rhythm:Regular Rate:Normal     Neuro/Psych negative neurological ROS  negative psych ROS   GI/Hepatic negative GI ROS, Neg liver ROS,,,  Endo/Other  negative endocrine ROS    Renal/GU negative Renal ROS  negative genitourinary   Musculoskeletal negative musculoskeletal ROS (+)    Abdominal   Peds negative pediatric ROS (+)  Hematology negative hematology ROS (+)   Anesthesia Other Findings abdominal pain Abnormal CT scan, colon Annual physical exam Colonic diverticular abscess Diverticulitis of colon with perforation Diverticulitis of large intestine with abscess without bleeding Establishing care with new doctor, encounter for History of sexually transmitted disease Humeral head fracture    Reproductive/Obstetrics negative OB ROS                             Anesthesia Physical Anesthesia Plan  ASA: 2  Anesthesia Plan: General   Post-op Pain Management: Minimal or no pain anticipated   Induction: Intravenous  PONV Risk Score and Plan: 1 and Propofol infusion  Airway Management Planned: Nasal Cannula and Natural Airway  Additional Equipment:   Intra-op Plan:   Post-operative Plan:   Informed Consent: I have reviewed the patients History and Physical, chart, labs and discussed the procedure including the risks, benefits and alternatives for the proposed anesthesia with the patient or authorized representative who has indicated  his/her understanding and acceptance.     Dental advisory given  Plan Discussed with: CRNA and Surgeon  Anesthesia Plan Comments:        Anesthesia Quick Evaluation

## 2022-08-23 NOTE — Anesthesia Postprocedure Evaluation (Signed)
Anesthesia Post Note  Patient: Thomas Valdez  Procedure(s) Performed: COLONOSCOPY WITH PROPOFOL POLYPECTOMY  Patient location during evaluation: Phase II Anesthesia Type: General Level of consciousness: awake and alert and oriented Pain management: pain level controlled Vital Signs Assessment: post-procedure vital signs reviewed and stable Respiratory status: spontaneous breathing, nonlabored ventilation and respiratory function stable Cardiovascular status: blood pressure returned to baseline and stable Postop Assessment: no apparent nausea or vomiting Anesthetic complications: no  No notable events documented.   Last Vitals:  Vitals:   08/23/22 1146 08/23/22 1302  BP: 117/77 95/78  Pulse: 62 68  Resp: (!) 21 (!) 24  Temp: 36.8 C 36.7 C  SpO2: 100% 96%    Last Pain:  Vitals:   08/23/22 1304  TempSrc:   PainSc: 0-No pain                 Chloeann Alfred C Wendi Lastra

## 2022-08-23 NOTE — Discharge Instructions (Signed)
  Colonoscopy Discharge Instructions  Read the instructions outlined below and refer to this sheet in the next few weeks. These discharge instructions provide you with general information on caring for yourself after you leave the hospital. Your doctor may also give you specific instructions. While your treatment has been planned according to the most current medical practices available, unavoidable complications occasionally occur. If you have any problems or questions after discharge, call Dr. Jena Gauss at [phone]. ACTIVITY You may resume your regular activity, but move at a slower pace for the next 24 hours.  Take frequent rest periods for the next 24 hours.  Walking will help get rid of the air and reduce the bloated feeling in your belly (abdomen).  No driving for 24 hours (because of the medicine (anesthesia) used during the test).   Do not sign any important legal documents or operate any machinery for 24 hours (because of the anesthesia used during the test).  NUTRITION Drink plenty of fluids.  You may resume your normal diet as instructed by your doctor.  Begin with a light meal and progress to your normal diet. Heavy or fried foods are harder to digest and may make you feel sick to your stomach (nauseated).  Avoid alcoholic beverages for 24 hours or as instructed.  MEDICATIONS You may resume your normal medications unless your doctor tells you otherwise.  WHAT YOU CAN EXPECT TODAY Some feelings of bloating in the abdomen.  Passage of more gas than usual.  Spotting of blood in your stool or on the toilet paper.  IF YOU HAD POLYPS REMOVED DURING THE COLONOSCOPY: No aspirin products for 7 days or as instructed.  No alcohol for 7 days or as instructed.  Eat a soft diet for the next 24 hours.  FINDING OUT THE RESULTS OF YOUR TEST Not all test results are available during your visit. If your test results are not back during the visit, make an appointment with your caregiver to find out the  results. Do not assume everything is normal if you have not heard from your caregiver or the medical facility. It is important for you to follow up on all of your test results.  SEEK IMMEDIATE MEDICAL ATTENTION IF: You have more than a spotting of blood in your stool.  Your belly is swollen (abdominal distention).  You are nauseated or vomiting.  You have a temperature over 101.  You have abdominal pain or discomfort that is severe or gets worse throughout the day.      You had 1 small polyp in your colon which was removed.  residual inflammatory changes of diverticulitis seen in the sigmoid colon.  There was no evidence of tumor.    Follow-up with general surgeon for consideration of  removal of your sigmoid colon   once I get the report back on the polyp I will be in touch with you.  This polyp has nothing to do illness.  It was in a different  part of your colon.   At patient request, I called Emeterio Reeve at [phone] -  reviewed findings and recommendations.

## 2022-08-23 NOTE — Interval H&P Note (Signed)
History and Physical Interval Note:  08/23/2022 12:41 PM  Thomas Valdez  has presented today for surgery, with the diagnosis of abnormal colon CT, complicated diverticulitis.  The various methods of treatment have been discussed with the patient and family. After consideration of risks, benefits and other options for treatment, the patient has consented to  Procedure(s) with comments: COLONOSCOPY WITH PROPOFOL (N/A) - 1:15 pm, asa 1 as a surgical intervention.  The patient's history has been reviewed, patient examined, no change in status, stable for surgery.  I have reviewed the patient's chart and labs.  Questions were answered to the patient's satisfaction.     [name]   no change. The risks, benefits, limitations, alternatives and imponderables have been reviewed with the patient. Questions have been answered. All parties are agreeable.

## 2022-08-23 NOTE — Op Note (Signed)
[facility] Patient Name: Thomas Valdez Procedure Date: 08/23/2022 11:49 AM MRN: [id number] Date of Birth: [birth year] Attending MD: Gennette Pac , MD, [id number] CSN: [id number] Age: 27 Admit Type: Outpatient Procedure:                Colonoscopy Indications:              Abnormal CT of the GI tract; recurrent CT                            documented episodes of sigmoid diverticulitis with                            abscess. Providers:                Gennette Pac, MD, Nena Polio, RN, Lennice Sites Technician, Technician Referring MD:              Medicines:                Propofol per Anesthesia Complications:            No immediate complications. Estimated Blood Loss:     Estimated blood loss was minimal. Procedure:                Pre-Anesthesia Assessment:                           - Prior to the procedure, a History and Physical                            was performed, and patient medications and                            allergies were reviewed. The patient's tolerance of                            previous anesthesia was also reviewed. The risks                            and benefits of the procedure and the sedation                            options and risks were discussed with the patient.                            All questions were answered, and informed consent                            was obtained. Prior Anticoagulants: The patient has                            taken no anticoagulant or antiplatelet agents. ASA  Grade Assessment: II - A patient with mild systemic                            disease. After reviewing the risks and benefits,                            the patient was deemed in satisfactory condition to                            undergo the procedure.                           After obtaining informed consent, the colonoscope                            was passed under  direct vision. Throughout the                            procedure, the patient's blood pressure, pulse, and                            oxygen saturations were monitored continuously. The                            [id number] scope was introduced through the                            anus and advanced to the the cecum, identified by                            appendiceal orifice and ileocecal valve. The                            colonoscopy was performed without difficulty. The                            patient tolerated the procedure well. The quality                            of the bowel preparation was adequate. The                            ileocecal valve, appendiceal orifice, and rectum                            were photographed. Scope In: 12:45:37 PM Scope Out: 12:57:52 PM Scope Withdrawal Time: 0 hours 8 minutes 47 seconds  Total Procedure Duration: 0 hours 12 minutes 15 seconds  Findings:      The perianal and digital rectal examinations were normal.      Scattered small-mouthed diverticula were found in the sigmoid colon and       distal descending colon. Sigmoid somewhat redundant. Sigmoid mucosa       minimally edematous with scattered submucosal petechiae. No tumor seen.       There  was no evidence of diverticular bleeding.      A 5 mm polyp was found in the splenic flexure. The polyp was sessile.       The polyp was removed with a cold snare. Resection and retrieval were       complete. Estimated blood loss was minimal.      The exam was otherwise without abnormality on direct and retroflexion       views. Impression:               - Diverticulosis in the sigmoid colon and in the                            distal descending colon. Changes of residual                            inflammation in the sigmoid segment. Sigmoid colon                            somewhat redundant. No tumor seen.                           - One 5 mm polyp at the splenic flexure,  removed                            with a cold snare. Resected and retrieved.                           - The examination was otherwise normal on direct                            and retroflexion views. Moderate Sedation:      Moderate (conscious) sedation was personally administered by an       anesthesia professional. The following parameters were monitored: oxygen       saturation, heart rate, blood pressure, respiratory rate, EKG, adequacy       of pulmonary ventilation, and response to care. Recommendation:           - Patient has a contact number available for                            emergencies. The signs and symptoms of potential                            delayed complications were discussed with the                            patient. Return to normal activities tomorrow.                            Written discharge instructions were provided to the                            patient.                           -  Advance diet as tolerated. Follow-up on pathology.                           - Agree with reconsultation with general surgery                            for elective generous sigmoid and distal descending                            resection. Procedure Code(s):        --- Professional ---                           [phone], Colonoscopy, flexible; with removal of                            tumor(s), polyp(s), or other lesion(s) by snare                            technique Diagnosis Code(s):        --- Professional ---                           D12.3, Benign neoplasm of transverse colon (hepatic                            flexure or splenic flexure)                           K57.30, Diverticulosis of large intestine without                            perforation or abscess without bleeding                           R93.3, Abnormal findings on diagnostic imaging of                            other parts of digestive tract CPT copyright 2022 [employer].  All rights reserved. The codes documented in this report are preliminary and upon coder review may  be revised to meet current compliance requirements. Thomas Friends. Luevenia Mcavoy, MD Gennette Pac, MD 08/23/2022 1:13:41 PM This report has been signed electronically. Number of Addenda: 0

## 2022-08-23 NOTE — Transfer of Care (Signed)
Immediate Anesthesia Transfer of Care Note  Patient: Thomas Valdez  Procedure(s) Performed: COLONOSCOPY WITH PROPOFOL  Patient Location: Endoscopy Unit  Anesthesia Type:General  Level of Consciousness: drowsy  Airway & Oxygen Therapy: Patient Spontanous Breathing  Post-op Assessment: Report given to RN and Post -op Vital signs reviewed and stable  Post vital signs: Reviewed and stable  Last Vitals:  Vitals Value Taken Time  BP    Temp    Pulse    Resp    SpO2      Last Pain:  Vitals:   08/23/22 1146  TempSrc: Oral  PainSc: 0-No pain      Patients Stated Pain Goal: 6 (08/23/22 1146)  Complications: No notable events documented.

## 2022-08-24 ENCOUNTER — Telehealth: Payer: Self-pay | Admitting: *Deleted

## 2022-08-24 NOTE — Telephone Encounter (Signed)
Received call from patient [phone]~ telephone.   Patient reports that he was seen in the hospital by Dr. Robyne Peers in June 2024 for diverticulitis. States that he was advised to complete outpatient antibiotics and have colonoscopy 6-8 weeks after treatment.   Patient reports that colonoscopy has been completed on 08/23/2022. Dr. Kendell Bane agreeable to elective resection of sigmoid colon. Agree with reconsultation with general surgery for elective generous sigmoid and distal descending resection.  Patient states that he would like to proceed with discussion on colon resection.

## 2022-08-24 NOTE — Telephone Encounter (Signed)
Appointment scheduled.

## 2022-08-27 ENCOUNTER — Encounter (HOSPITAL_COMMUNITY): Payer: Self-pay | Admitting: Internal Medicine

## 2022-09-05 ENCOUNTER — Emergency Department (HOSPITAL_COMMUNITY): Payer: Federal, State, Local not specified - PPO

## 2022-09-05 ENCOUNTER — Encounter (HOSPITAL_COMMUNITY): Payer: Self-pay | Admitting: Emergency Medicine

## 2022-09-05 ENCOUNTER — Other Ambulatory Visit: Payer: Self-pay

## 2022-09-05 ENCOUNTER — Inpatient Hospital Stay (HOSPITAL_COMMUNITY)
Admission: EM | Admit: 2022-09-05 | Discharge: 2022-09-08 | DRG: 392 | Disposition: A | Discharge status: Home / Self Care | Payer: Federal, State, Local not specified - PPO | Attending: Internal Medicine | Admitting: Internal Medicine

## 2022-09-05 DIAGNOSIS — F17200 Nicotine dependence, unspecified, uncomplicated: Secondary | ICD-10-CM | POA: Diagnosis present

## 2022-09-05 DIAGNOSIS — R630 Anorexia: Secondary | ICD-10-CM

## 2022-09-05 DIAGNOSIS — Z6824 Body mass index (BMI) 24.0-24.9, adult: Secondary | ICD-10-CM | POA: Diagnosis not present

## 2022-09-05 DIAGNOSIS — K5732 Diverticulitis of large intestine without perforation or abscess without bleeding: Principal | ICD-10-CM | POA: Diagnosis present

## 2022-09-05 DIAGNOSIS — M549 Dorsalgia, unspecified: Secondary | ICD-10-CM | POA: Diagnosis not present

## 2022-09-05 DIAGNOSIS — E876 Hypokalemia: Secondary | ICD-10-CM | POA: Diagnosis not present

## 2022-09-05 DIAGNOSIS — N200 Calculus of kidney: Secondary | ICD-10-CM | POA: Diagnosis not present

## 2022-09-05 DIAGNOSIS — R1032 Left lower quadrant pain: Secondary | ICD-10-CM | POA: Diagnosis not present

## 2022-09-05 DIAGNOSIS — K529 Noninfective gastroenteritis and colitis, unspecified: Secondary | ICD-10-CM | POA: Diagnosis present

## 2022-09-05 DIAGNOSIS — E119 Type 2 diabetes mellitus without complications: Secondary | ICD-10-CM | POA: Diagnosis not present

## 2022-09-05 DIAGNOSIS — K5792 Diverticulitis of intestine, part unspecified, without perforation or abscess without bleeding: Secondary | ICD-10-CM | POA: Diagnosis not present

## 2022-09-05 DIAGNOSIS — A539 Syphilis, unspecified: Secondary | ICD-10-CM | POA: Diagnosis present

## 2022-09-05 DIAGNOSIS — Z8249 Family history of ischemic heart disease and other diseases of the circulatory system: Secondary | ICD-10-CM

## 2022-09-05 DIAGNOSIS — K635 Polyp of colon: Secondary | ICD-10-CM | POA: Diagnosis present

## 2022-09-05 LAB — COMPREHENSIVE METABOLIC PANEL
ALT: 16 U/L (ref 0–44)
AST: 18 U/L (ref 15–41)
Albumin: 4.2 g/dL (ref 3.5–5.0)
Alkaline Phosphatase: 69 U/L (ref 38–126)
Anion gap: 8 (ref 5–15)
BUN: 12 mg/dL (ref 6–20)
CO2: 25 mmol/L (ref 22–32)
Calcium: 9 mg/dL (ref 8.9–10.3)
Chloride: 102 mmol/L (ref 98–111)
Creatinine, Ser: 0.88 mg/dL (ref 0.61–1.24)
GFR, Estimated: >60 mL/min (ref >60)
Glucose, Bld: 117 mg/dL — ABNORMAL HIGH (ref 70–99)
Potassium: 3.3 mmol/L — ABNORMAL LOW (ref 3.5–5.1)
Sodium: 135 mmol/L (ref 135–145)
Total Bilirubin: 0.8 mg/dL (ref 0.3–1.2)
Total Protein: 8 g/dL (ref 6.5–8.1)

## 2022-09-05 LAB — CBC WITH DIFFERENTIAL/PLATELET
Abs Immature Granulocytes: 0.02 10*3/uL (ref 0.00–0.07)
Basophils Absolute: 0 10*3/uL (ref 0.0–0.1)
Basophils Relative: 0 %
Eosinophils Absolute: 0 10*3/uL (ref 0.0–0.5)
Eosinophils Relative: 0 %
HCT: 39.8 % (ref 39.0–52.0)
Hemoglobin: 13.4 g/dL (ref 13.0–17.0)
Immature Granulocytes: 0 %
Lymphocytes Relative: 19 %
Lymphs Abs: 1.6 10*3/uL (ref 0.7–4.0)
MCH: 30 pg (ref 26.0–34.0)
MCHC: 33.7 g/dL (ref 30.0–36.0)
MCV: 89.2 fL (ref 80.0–100.0)
Monocytes Absolute: 0.6 10*3/uL (ref 0.1–1.0)
Monocytes Relative: 7 %
Neutro Abs: 6.3 10*3/uL (ref 1.7–7.7)
Neutrophils Relative %: 74 %
Platelets: 363 10*3/uL (ref 150–400)
RBC: 4.46 MIL/uL (ref 4.22–5.81)
RDW: 12.8 % (ref 11.5–15.5)
WBC: 8.6 10*3/uL (ref 4.0–10.5)
nRBC: 0 % (ref 0.0–0.2)

## 2022-09-05 LAB — URINALYSIS, ROUTINE W REFLEX MICROSCOPIC
Bilirubin Urine: NEGATIVE
Glucose, UA: NEGATIVE mg/dL
Hgb urine dipstick: NEGATIVE
Ketones, ur: NEGATIVE mg/dL
Leukocytes,Ua: NEGATIVE
Nitrite: NEGATIVE
Protein, ur: NEGATIVE mg/dL
Specific Gravity, Urine: 1.013 (ref 1.005–1.030)
pH: 6 (ref 5.0–8.0)

## 2022-09-05 LAB — LIPASE, BLOOD: Lipase: 27 U/L (ref 11–51)

## 2022-09-05 MED ORDER — HYDROMORPHONE HCL 1 MG/ML IJ SOLN
1.0000 mg | Freq: Once | INTRAMUSCULAR | Status: AC
  Administered 2022-09-05: 1 mg via INTRAVENOUS
  Filled 2022-09-05: qty 1

## 2022-09-05 MED ORDER — HYDROMORPHONE HCL 1 MG/ML IJ SOLN
1.0000 mg | INTRAMUSCULAR | Status: DC | PRN
  Administered 2022-09-05 – 2022-09-07 (×12): 1 mg via INTRAVENOUS
  Filled 2022-09-05 (×13): qty 1

## 2022-09-05 MED ORDER — CIPROFLOXACIN IN D5W 400 MG/200ML IV SOLN
400.0000 mg | Freq: Once | INTRAVENOUS | Status: AC
  Administered 2022-09-05: 400 mg via INTRAVENOUS
  Filled 2022-09-05: qty 200

## 2022-09-05 MED ORDER — IOHEXOL 300 MG/ML  SOLN
100.0000 mL | Freq: Once | INTRAMUSCULAR | Status: AC | PRN
  Administered 2022-09-05: 100 mL via INTRAVENOUS

## 2022-09-05 MED ORDER — MORPHINE SULFATE (PF) 4 MG/ML IV SOLN
4.0000 mg | Freq: Once | INTRAVENOUS | Status: AC
  Administered 2022-09-05: 4 mg via INTRAVENOUS
  Filled 2022-09-05: qty 1

## 2022-09-05 MED ORDER — SODIUM CHLORIDE 0.9 % IV BOLUS
1000.0000 mL | Freq: Once | INTRAVENOUS | Status: AC
  Administered 2022-09-05: 1000 mL via INTRAVENOUS

## 2022-09-05 MED ORDER — HYDROMORPHONE HCL 1 MG/ML IJ SOLN: 0.5000 mg | INTRAMUSCULAR | Status: DC | PRN

## 2022-09-05 MED ORDER — METRONIDAZOLE 500 MG/100ML IV SOLN
500.0000 mg | Freq: Once | INTRAVENOUS | Status: AC
  Administered 2022-09-05: 500 mg via INTRAVENOUS
  Filled 2022-09-05: qty 100

## 2022-09-05 MED ORDER — ORAL CARE MOUTH RINSE: 15.0000 mL | OROMUCOSAL | Status: DC | PRN

## 2022-09-05 MED ORDER — ONDANSETRON HCL 4 MG/2ML IJ SOLN
4.0000 mg | Freq: Once | INTRAMUSCULAR | Status: AC
  Administered 2022-09-05: 4 mg via INTRAVENOUS
  Filled 2022-09-05: qty 2

## 2022-09-05 MED ORDER — ONDANSETRON HCL 4 MG PO TABS: 4.0000 mg | ORAL_TABLET | Freq: Four times a day (QID) | ORAL | Status: DC | PRN

## 2022-09-05 MED ORDER — ONDANSETRON HCL 4 MG/2ML IJ SOLN
4.0000 mg | Freq: Four times a day (QID) | INTRAMUSCULAR | Status: DC | PRN
  Administered 2022-09-05 – 2022-09-06 (×3): 4 mg via INTRAVENOUS
  Filled 2022-09-05 (×4): qty 2

## 2022-09-05 NOTE — Plan of Care (Signed)

## 2022-09-05 NOTE — H&P (Signed)
History and Physical    Patient: Thomas Valdez QQV:[id number] DOB: 1995-08-12 DOA: 09/05/2022 DOS: the patient was seen and examined on 09/05/2022 PCP: Patient, No Pcp Per  Patient coming from: Home  Chief Complaint:  Chief Complaint  Patient presents with   Back Pain   HPI: Thomas Valdez is a 27 y.o. male with medical history significant of ***      Review of Systems: As mentioned in the history of present illness. All other systems reviewed and are negative. Past Medical History:  Diagnosis Date   Diverticulitis    Syphilis    per patient treated at age 48   Past Surgical History:  Procedure Laterality Date   COLONOSCOPY WITH PROPOFOL N/A 08/23/2022   Procedure: COLONOSCOPY WITH PROPOFOL;  Surgeon: Corbin Ade, MD;  Location: [facility];  Service: Endoscopy;  Laterality: N/A;  1:15 pm, asa 1   none     POLYPECTOMY  08/23/2022   Procedure: POLYPECTOMY;  Surgeon: Corbin Ade, MD;  Location: [facility];  Service: Endoscopy;;   Social History:  reports that he has never smoked. He has never used smokeless tobacco. He reports current alcohol use. He reports current drug use. Drug: Marijuana.  No Known Allergies  Family History  Problem Relation Age of Onset   Cancer Father        prostate   Heart disease Maternal Grandmother    Inflammatory bowel disease Neg Hx    Colon cancer Neg Hx     Prior to Admission medications   Medication Sig Start Date End Date Taking? Authorizing Provider  acetaminophen (TYLENOL) 500 MG tablet Take 2 tablets (1,000 mg total) by mouth every 6 (six) hours. 08/13/21  Yes Gillis Santa, MD  valACYclovir (VALTREX) 500 MG tablet Take 1 tablet (500 mg total) by mouth 2 (two) times daily. Patient taking differently: Take 500 mg by mouth 2 (two) times daily as needed (flare ups of cold sores). 07/09/22  Yes Waldon Merl, PA-C    Physical Exam: Vitals:   09/05/22 1745 09/05/22 1813 09/05/22 1850 09/05/22 2110  BP:  121/81  114/75 (!) 100/59  Pulse: 77  71 71  Resp: 18  18 18   Temp: 98.8 F (37.1 C)  99.2 F (37.3 C) 98.7 F (37.1 C)  TempSrc: Oral  Oral Oral  SpO2: 99%  98% 99%  Weight:  78.5 kg    Height:  5\' 11"  (1.803 m)     *** Data Reviewed: {Tip this will not be part of the note when signed- Document your independent interpretation of telemetry tracing, EKG, lab, Radiology test or any other diagnostic tests. Add any new diagnostic test ordered today. (Optional):26781} Results for orders placed or performed during the hospital encounter of 09/05/22 (from the past 24 hour(s))  CBC with Differential     Status: None   Collection Time: 09/05/22  2:40 PM  Result Value Ref Range   WBC 8.6 4.0 - 10.5 K/uL   RBC 4.46 4.22 - 5.81 MIL/uL   Hemoglobin 13.4 13.0 - 17.0 g/dL   HCT 33.2 95.1 - 88.4 %   MCV 89.2 80.0 - 100.0 fL   MCH 30.0 26.0 - 34.0 pg   MCHC 33.7 30.0 - 36.0 g/dL   RDW 16.6 06.3 - 01.6 %   Platelets 363 150 - 400 K/uL   nRBC 0.0 0.0 - 0.2 %   Neutrophils Relative % 74 %   Neutro Abs 6.3 1.7 - 7.7 K/uL  Lymphocytes Relative 19 %   Lymphs Abs 1.6 0.7 - 4.0 K/uL   Monocytes Relative 7 %   Monocytes Absolute 0.6 0.1 - 1.0 K/uL   Eosinophils Relative 0 %   Eosinophils Absolute 0.0 0.0 - 0.5 K/uL   Basophils Relative 0 %   Basophils Absolute 0.0 0.0 - 0.1 K/uL   Immature Granulocytes 0 %   Abs Immature Granulocytes 0.02 0.00 - 0.07 K/uL  Comprehensive metabolic panel     Status: Abnormal   Collection Time: 09/05/22  2:40 PM  Result Value Ref Range   Sodium 135 135 - 145 mmol/L   Potassium 3.3 (L) 3.5 - 5.1 mmol/L   Chloride 102 98 - 111 mmol/L   CO2 25 22 - 32 mmol/L   Glucose, Bld 117 (H) 70 - 99 mg/dL   BUN 12 6 - 20 mg/dL   Creatinine, Ser 1.61 0.61 - 1.24 mg/dL   Calcium 9.0 8.9 - 09.6 mg/dL   Total Protein 8.0 6.5 - 8.1 g/dL   Albumin 4.2 3.5 - 5.0 g/dL   AST 18 15 - 41 U/L   ALT 16 0 - 44 U/L   Alkaline Phosphatase 69 38 - 126 U/L   Total Bilirubin 0.8 0.3 -  1.2 mg/dL   GFR, Estimated >04 >54 mL/min   Anion gap 8 5 - 15  Lipase, blood     Status: None   Collection Time: 09/05/22  2:40 PM  Result Value Ref Range   Lipase 27 11 - 51 U/L  Urinalysis, Routine w reflex microscopic -Urine, Clean Catch     Status: None   Collection Time: 09/05/22  2:52 PM  Result Value Ref Range   Color, Urine YELLOW YELLOW   APPearance CLEAR CLEAR   Specific Gravity, Urine 1.013 1.005 - 1.030   pH 6.0 5.0 - 8.0   Glucose, UA NEGATIVE NEGATIVE mg/dL   Hgb urine dipstick NEGATIVE NEGATIVE   Bilirubin Urine NEGATIVE NEGATIVE   Ketones, ur NEGATIVE NEGATIVE mg/dL   Protein, ur NEGATIVE NEGATIVE mg/dL   Nitrite NEGATIVE NEGATIVE   Leukocytes,Ua NEGATIVE NEGATIVE   CT Abd / pelvis IMPRESSION: Mild improvement in moderate sigmoid diverticulitis, with evidence of microperforation. No evidence of abscess or bowel obstruction.   Tiny right renal calculus. No evidence of ureteral calculi or hydronephrosis.   Assessment and Plan:     Advance Care Planning:   Code Status: Full Code ***  Consults: ***  Family Communication: ***  Severity of Illness: {Observation/Inpatient:21159}  Author: Buena Irish, MD 09/05/2022 11:37 PM  For on call review [email or web address].

## 2022-09-05 NOTE — ED Notes (Signed)
Instructed patient to provide urine sample if he needed to go.

## 2022-09-05 NOTE — ED Triage Notes (Signed)
Pt c/o lower back and lower abd pain since Thursday.

## 2022-09-05 NOTE — ED Notes (Signed)
Pt in bathroom

## 2022-09-05 NOTE — ED Notes (Signed)
Pt states he has same symptoms when he's had diverticulitis flare up in the past. Pt is having lower abd pain as well. Pt states area is extremely tender.

## 2022-09-05 NOTE — ED Notes (Signed)
Called floor to given report. Nurse to call back for report.

## 2022-09-05 NOTE — ED Provider Notes (Signed)
[place] EMERGENCY DEPARTMENT AT [facility] Provider Note   CSN: [id number] Arrival date & time: 09/05/22  1415     History  Chief Complaint  Patient presents with   Back Pain    Thomas Valdez is a 27 y.o. male.  The history is provided by the patient and medical records. No language interpreter was used.  Back Pain    27 year old male with significant history of diverticular disease and history of STI presenting complaining of abdominal pain.  Patient mention he was treated for diverticulitis 2 months ago and is scheduled to be seen by a surgeon for further treatment of his diverticulitis.  For the past 2 to 3 days he has had progressive worsening abdominal pain which he describes as sharp cramping sensation with bouts of nausea but no vomiting and his pain felt very similar to his prior diverticulitis flare.  Pain is not adequately controlled despite using over-the-counter medication such as Tylenol and ibuprofen at home.  No fever or chills no chest pain or shortness of breath no urinary symptom.  He felt he needs to be seen earlier rather than later for his symptoms.  No report of any bloody stool.  Home Medications Prior to Admission medications   Medication Sig Start Date End Date Taking? Authorizing Provider  acetaminophen (TYLENOL) 500 MG tablet Take 2 tablets (1,000 mg total) by mouth every 6 (six) hours. 08/13/21   Gillis Santa, MD  valACYclovir (VALTREX) 500 MG tablet Take 1 tablet (500 mg total) by mouth 2 (two) times daily. Patient not taking: Reported on 08/04/2022 07/09/22   Waldon Merl, PA-C      Allergies    Patient has no known allergies.    Review of Systems   Review of Systems  Musculoskeletal:  Positive for back pain.  All other systems reviewed and are negative.   Physical Exam Updated Vital Signs BP 125/89 (BP Location: Right Arm)   Pulse (!) 113   Temp 97.7 F (36.5 C)   Resp 18   Ht 5\' 11"  (1.803 m)   Wt 76.2 kg   SpO2  99%   BMI 23.43 kg/m  Physical Exam Vitals and nursing note reviewed.  Constitutional:      General: He is not in acute distress.    Appearance: He is well-developed.  HENT:     Head: Atraumatic.  Eyes:     Conjunctiva/sclera: Conjunctivae normal.  Cardiovascular:     Rate and Rhythm: Tachycardia present.     Pulses: Normal pulses.     Heart sounds: Normal heart sounds.  Pulmonary:     Effort: Pulmonary effort is normal.     Breath sounds: Normal breath sounds.  Abdominal:     Palpations: Abdomen is soft.     Tenderness: There is abdominal tenderness (Tenderness to suprapubic region on palpation no guarding no rebound tenderness.).  Musculoskeletal:     Cervical back: Neck supple.  Skin:    Findings: No rash.  Neurological:     Mental Status: He is alert.     ED Results / Procedures / Treatments   Labs (all labs ordered are listed, but only abnormal results are displayed) Labs Reviewed  COMPREHENSIVE METABOLIC PANEL - Abnormal; Notable for the following components:      Result Value   Potassium 3.3 (*)    Glucose, Bld 117 (*)    All other components within normal limits  CBC WITH DIFFERENTIAL/PLATELET  LIPASE, BLOOD  URINALYSIS, ROUTINE W REFLEX  MICROSCOPIC    EKG None  Radiology CT ABDOMEN PELVIS W CONTRAST  Result Date: 09/05/2022 CLINICAL DATA:  Left lower quadrant pain for three days. Diverticulitis. EXAM: CT ABDOMEN AND PELVIS WITH CONTRAST TECHNIQUE: Multidetector CT imaging of the abdomen and pelvis was performed using the standard protocol following bolus administration of intravenous contrast. RADIATION DOSE REDUCTION: This exam was performed according to the departmental dose-optimization program which includes automated exposure control, adjustment of the mA and/or kV according to patient size and/or use of iterative reconstruction technique. CONTRAST:  OMNIPAQUE IOHEXOL 300 MG/ML  SOLN COMPARISON:  06/25/2022 FINDINGS: Lower Chest: No acute  findings. Hepatobiliary: No suspicious hepatic masses identified. Gallbladder is unremarkable. No evidence of biliary ductal dilatation. Pancreas:  No mass or inflammatory changes. Spleen: Within normal limits in size and appearance. Adrenals/Urinary Tract: No suspicious masses identified. 2 mm right renal calculus noted. No evidence of ureteral calculi or hydronephrosis. Unremarkable unopacified urinary bladder. Stomach/Bowel: Moderate sigmoid diverticulitis shows mild improvement. Several tiny adjacent air bubbles are consistent with micro perforation. No abscess identified. No evidence of bowel obstruction. Previously seen thickening of adjacent small bowel loops has resolved. Tiny amount of free fluid again noted in pelvic cul-de-sac. Vascular/Lymphatic: No pathologically enlarged lymph nodes. No acute vascular findings. Reproductive:  No mass or other significant abnormality. Other:  None. Musculoskeletal:  No suspicious bone lesions identified. IMPRESSION: Mild improvement in moderate sigmoid diverticulitis, with evidence of microperforation. No evidence of abscess or bowel obstruction. Tiny right renal calculus. No evidence of ureteral calculi or hydronephrosis. Electronically Signed   By: Danae Orleans M.D.   On: 09/05/2022 16:57    Procedures .Critical Care  Performed by: Fayrene Helper, PA-C Authorized by: Fayrene Helper, PA-C   Critical care provider statement:    Critical care time (minutes):  30   Critical care was time spent personally by me on the following activities:  Development of treatment plan with patient or surrogate, discussions with consultants, evaluation of patient's response to treatment, examination of patient, ordering and review of laboratory studies, ordering and review of radiographic studies, ordering and performing treatments and interventions, pulse oximetry, re-evaluation of patient's condition and review of old charts     Medications Ordered in ED Medications  sodium  chloride 0.9 % bolus 1,000 mL (1,000 mLs Intravenous New Bag/Given 09/05/22 1445)  morphine (PF) 4 MG/ML injection 4 mg (4 mg Intravenous Given 09/05/22 1444)  ondansetron (ZOFRAN) injection 4 mg (4 mg Intravenous Given 09/05/22 1445)  HYDROmorphone (DILAUDID) injection 1 mg (1 mg Intravenous Given 09/05/22 1531)  iohexol (OMNIPAQUE) 300 MG/ML solution 100 mL (100 mLs Intravenous Contrast Given 09/05/22 1549)    ED Course/ Medical Decision Making/ A&P                                 Medical Decision Making Amount and/or Complexity of Data Reviewed Labs: ordered. Radiology: ordered.  Risk Prescription drug management.   BP 125/89 (BP Location: Right Arm)   Pulse (!) 113   Temp 97.7 F (36.5 C)   Resp 18   Ht 5\' 11"  (1.803 m)   Wt 76.2 kg   SpO2 99%   BMI 23.43 kg/m   21:56 PM  27 year old male with significant history of diverticular disease and history of STI presenting complaining of abdominal pain.  Patient mention he was treated for diverticulitis 2 months ago and is scheduled to be seen by a surgeon for further  treatment of his diverticulitis.  For the past 2 to 3 days he has had progressive worsening abdominal pain which he describes as sharp cramping sensation with bouts of nausea but no vomiting and his pain felt very similar to his prior diverticulitis flare.  Pain is not adequately controlled despite using over-the-counter medication such as Tylenol and ibuprofen at home.  No fever or chills no chest pain or shortness of breath no urinary symptom.  He felt he needs to be seen earlier rather than later for his symptoms.  No report of any bloody stool.  On exam this is a well-appearing male resting comfortably in bed appears to be in no acute discomfort.  Heart with tachycardia, lungs are clear to auscultation bilaterally abdomen is soft with some suprapubic tenderness but no guarding or rebound tenderness.  Vitals are notable for tachycardia with heart rate of 113.  Patient is  afebrile no hypoxia.  -Labs ordered, independently viewed and interpreted by me.  Labs remarkable for K+ 3.3.  normal WBC, normal H&H. -The patient was maintained on a cardiac monitor.  I personally viewed and interpreted the cardiac monitored which showed an underlying rhythm of: NSR -Imaging independently viewed and interpreted by me and I agree with radiologist's interpretation.  Result remarkable for abd/pelvis CT showing mild improvement in mdoerate sigmoid diverticulitis with evidence of microperforation but no abscess or SBO.   -This patient presents to the ED for concern of abd pain, this involves an extensive number of treatment options, and is a complaint that carries with it a high risk of complications and morbidity.  The differential diagnosis includes diverticulitis, colitis, perforation, abscess, appendicitis, UTI -Co morbidities that complicate the patient evaluation includes recurrent complicated diverticulitis -Treatment includes cipro, flagyl, dilaudid, morphine, IVF -Reevaluation of the patient after these medicines showed that the patient improved -PCP office notes or outside notes reviewed -Discussion with specialist GI specialist Dr. Tasia Catchings who recommend consultation to general surgery, abx and have pt admitted.  His team will see pt tomorrow. I have consulted general surgeon Dr. Lovell Sheehan who agrees to see pt tomorrow once admitted. I have also consulted with [facility] Dr. Richrd Sox who agrees to see and will admit pt for complicated diverticulitis.  -Escalation to admission/observation considered: patient is agreeable with admission.         Final Clinical Impression(s) / ED Diagnoses Final diagnoses:  Diverticulitis of large intestine with complication    Rx / DC Orders ED Discharge Orders     None         Fayrene Helper, PA-C 09/05/22 1802    Gerhard Munch, MD 09/07/22 [phone]

## 2022-09-05 NOTE — ED Notes (Signed)
ED TO INPATIENT HANDOFF REPORT  ED Nurse Name and Phone #: [phone]  S Name/Age/Gender Thomas Valdez 27 y.o. male Room/Bed: APA14/APA14  Code Status   Code Status: Full Code  Home/SNF/Other Home Patient oriented to: self, place, time, and situation Is this baseline? Yes   Triage Complete: Triage complete  Chief Complaint Diverticulitis [K57.92]  Triage Note Pt c/o lower back and lower abd pain since Thursday.    Allergies No Known Allergies  Level of Care/Admitting Diagnosis ED Disposition     ED Disposition  Admit   Condition  --   Comment  Hospital Area: [facility] [[id number]]  Level of Care: Med-Surg [16]  Covid Evaluation: Asymptomatic - no recent exposure (last 10 days) testing not required  Diagnosis: Diverticulitis [295621]  Admitting Physician: Buena Irish [[id number]]  Attending Physician: Buena Irish [phone]  Certification:: I certify this patient will need inpatient services for at least 2 midnights  Expected Medical Readiness: 09/09/2022          B Medical/Surgery History Past Medical History:  Diagnosis Date   Diverticulitis    Syphilis    per patient treated at age 21   Past Surgical History:  Procedure Laterality Date   COLONOSCOPY WITH PROPOFOL N/A 08/23/2022   Procedure: COLONOSCOPY WITH PROPOFOL;  Surgeon: Corbin Ade, MD;  [facility];  Service: Endoscopy;  Laterality: N/A;  1:15 pm, asa 1   none     POLYPECTOMY  08/23/2022   Procedure: POLYPECTOMY;  Surgeon: Corbin Ade, MD;  Location: [facility];  Service: Endoscopy;;     A IV Location/Drains/Wounds Patient Lines/Drains/Airways Status     Active Line/Drains/Airways     Name Placement date Placement time Site Days   Peripheral IV 09/05/22 20 G Anterior;Left Forearm 09/05/22  1430  Forearm  less than 1            Intake/Output Last 24 hours  Intake/Output Summary (Last 24 hours) at 09/05/2022 1824 Last data filed at  09/05/2022 1740 Gross per 24 hour  Intake 1000 ml  Output --  Net 1000 ml    Labs/Imaging Results for orders placed or performed during the hospital encounter of 09/05/22 (from the past 48 hour(s))  CBC with Differential     Status: None   Collection Time: 09/05/22  2:40 PM  Result Value Ref Range   WBC 8.6 4.0 - 10.5 K/uL   RBC 4.46 4.22 - 5.81 MIL/uL   Hemoglobin 13.4 13.0 - 17.0 g/dL   HCT 57.8 46.9 - 62.9 %   MCV 89.2 80.0 - 100.0 fL   MCH 30.0 26.0 - 34.0 pg   MCHC 33.7 30.0 - 36.0 g/dL   RDW 52.8 41.3 - 24.4 %   Platelets 363 150 - 400 K/uL   nRBC 0.0 0.0 - 0.2 %   Neutrophils Relative % 74 %   Neutro Abs 6.3 1.7 - 7.7 K/uL   Lymphocytes Relative 19 %   Lymphs Abs 1.6 0.7 - 4.0 K/uL   Monocytes Relative 7 %   Monocytes Absolute 0.6 0.1 - 1.0 K/uL   Eosinophils Relative 0 %   Eosinophils Absolute 0.0 0.0 - 0.5 K/uL   Basophils Relative 0 %   Basophils Absolute 0.0 0.0 - 0.1 K/uL   Immature Granulocytes 0 %   Abs Immature Granulocytes 0.02 0.00 - 0.07 K/uL    Comment: Performed at [facility], [address], [place], [place] [address]  Comprehensive metabolic panel  Status: Abnormal   Collection Time: 09/05/22  2:40 PM  Result Value Ref Range   Sodium 135 135 - 145 mmol/L   Potassium 3.3 (L) 3.5 - 5.1 mmol/L   Chloride 102 98 - 111 mmol/L   CO2 25 22 - 32 mmol/L   Glucose, Bld 117 (H) 70 - 99 mg/dL    Comment: Glucose reference range applies only to samples taken after fasting for at least 8 hours.   BUN 12 6 - 20 mg/dL   Creatinine, Ser 1.61 0.61 - 1.24 mg/dL   Calcium 9.0 8.9 - 09.6 mg/dL   Total Protein 8.0 6.5 - 8.1 g/dL   Albumin 4.2 3.5 - 5.0 g/dL   AST 18 15 - 41 U/L   ALT 16 0 - 44 U/L   Alkaline Phosphatase 69 38 - 126 U/L   Total Bilirubin 0.8 0.3 - 1.2 mg/dL   GFR, Estimated >04 >54 mL/min    Comment: (NOTE) Calculated using the CKD-EPI Creatinine Equation (2021)    Anion gap 8 5 - 15    Comment: Performed at [facility], [address], [place], [place] [address]  Lipase, blood     Status: None   Collection Time: 09/05/22  2:40 PM  Result Value Ref Range   Lipase 27 11 - 51 U/L    Comment: Performed at [facility], [address] [place], [place] [address]  Urinalysis, Routine w reflex microscopic -Urine, Clean Catch     Status: None   Collection Time: 09/05/22  2:52 PM  Result Value Ref Range   Color, Urine YELLOW YELLOW   APPearance CLEAR CLEAR   Specific Gravity, Urine 1.013 1.005 - 1.030   pH 6.0 5.0 - 8.0   Glucose, UA NEGATIVE NEGATIVE mg/dL   Hgb urine dipstick NEGATIVE NEGATIVE   Bilirubin Urine NEGATIVE NEGATIVE   Ketones, ur NEGATIVE NEGATIVE mg/dL   Protein, ur NEGATIVE NEGATIVE mg/dL   Nitrite NEGATIVE NEGATIVE   Leukocytes,Ua NEGATIVE NEGATIVE    Comment: Performed at [facility], [address], [place], [place] [address]   CT ABDOMEN PELVIS W CONTRAST  Result Date: 09/05/2022 CLINICAL DATA:  Left lower quadrant pain for three days. Diverticulitis. EXAM: CT ABDOMEN AND PELVIS WITH CONTRAST TECHNIQUE: Multidetector CT imaging of the abdomen and pelvis was performed using the standard protocol following bolus administration of intravenous contrast. RADIATION DOSE REDUCTION: This exam was performed according to the departmental dose-optimization program which includes automated exposure control, adjustment of the mA and/or kV according to patient size and/or use of iterative reconstruction technique. CONTRAST:  OMNIPAQUE IOHEXOL 300 MG/ML  SOLN COMPARISON:  06/25/2022 FINDINGS: Lower Chest: No acute findings. Hepatobiliary: No suspicious hepatic masses identified. Gallbladder is unremarkable. No evidence of biliary ductal dilatation. Pancreas:  No mass or inflammatory changes. Spleen: Within normal limits in size and appearance. Adrenals/Urinary Tract: No suspicious masses identified. 2 mm right renal calculus noted. No evidence of ureteral calculi or hydronephrosis. Unremarkable unopacified urinary bladder.  Stomach/Bowel: Moderate sigmoid diverticulitis shows mild improvement. Several tiny adjacent air bubbles are consistent with micro perforation. No abscess identified. No evidence of bowel obstruction. Previously seen thickening of adjacent small bowel loops has resolved. Tiny amount of free fluid again noted in pelvic cul-de-sac. Vascular/Lymphatic: No pathologically enlarged lymph nodes. No acute vascular findings. Reproductive:  No mass or other significant abnormality. Other:  None. Musculoskeletal:  No suspicious bone lesions identified. IMPRESSION: Mild improvement in moderate sigmoid diverticulitis, with evidence of microperforation. No evidence of abscess  or bowel obstruction. Tiny right renal calculus. No evidence of ureteral calculi or hydronephrosis. Electronically Signed   By: Danae Orleans M.D.   On: 09/05/2022 16:57    Pending Labs Unresulted Labs (From admission, onward)     Start     Ordered   09/06/22 0500  Magnesium  Tomorrow morning,   R        09/05/22 1811   09/06/22 0500  CBC  Tomorrow morning,   R        09/05/22 1811   09/06/22 0500  Basic metabolic panel  Tomorrow morning,   R        09/05/22 1811            Vitals/Pain Today's Vitals   09/05/22 1600 09/05/22 1700 09/05/22 1730 09/05/22 1800  BP:  114/69 121/73   Pulse:  69 72   Resp:   18   Temp:   98.8 F (37.1 C)   TempSrc:   Oral   SpO2:  100% 100%   Weight:      Height:      PainSc: Asleep  8  5     Isolation Precautions No active isolations  Medications Medications  ciprofloxacin (CIPRO) IVPB 400 mg (400 mg Intravenous New Bag/Given 09/05/22 1729)  metroNIDAZOLE (FLAGYL) IVPB 500 mg (500 mg Intravenous New Bag/Given 09/05/22 1732)  ondansetron (ZOFRAN) tablet 4 mg (has no administration in time range)    Or  ondansetron (ZOFRAN) injection 4 mg (has no administration in time range)  sodium chloride 0.9 % bolus 1,000 mL (0 mLs Intravenous Stopped 09/05/22 1740)  morphine (PF) 4 MG/ML injection 4 mg  (4 mg Intravenous Given 09/05/22 1444)  ondansetron (ZOFRAN) injection 4 mg (4 mg Intravenous Given 09/05/22 1445)  HYDROmorphone (DILAUDID) injection 1 mg (1 mg Intravenous Given 09/05/22 1531)  iohexol (OMNIPAQUE) 300 MG/ML solution 100 mL (100 mLs Intravenous Contrast Given 09/05/22 1549)  HYDROmorphone (DILAUDID) injection 1 mg (1 mg Intravenous Given 09/05/22 1735)    Mobility walks     Focused Assessments Diverticulitis flare up   R Recommendations: See Admitting Provider Note  Report given to:   Additional Notes:  Diverticulitis flare up

## 2022-09-06 DIAGNOSIS — R1032 Left lower quadrant pain: Secondary | ICD-10-CM | POA: Diagnosis not present

## 2022-09-06 DIAGNOSIS — K5792 Diverticulitis of intestine, part unspecified, without perforation or abscess without bleeding: Secondary | ICD-10-CM | POA: Diagnosis not present

## 2022-09-06 DIAGNOSIS — R630 Anorexia: Secondary | ICD-10-CM | POA: Diagnosis not present

## 2022-09-06 LAB — CBC
HCT: 37.9 % — ABNORMAL LOW (ref 39.0–52.0)
Hemoglobin: 12.5 g/dL — ABNORMAL LOW (ref 13.0–17.0)
MCH: 29.9 pg (ref 26.0–34.0)
MCHC: 33 g/dL (ref 30.0–36.0)
MCV: 90.7 fL (ref 80.0–100.0)
Platelets: 333 10*3/uL (ref 150–400)
RBC: 4.18 MIL/uL — ABNORMAL LOW (ref 4.22–5.81)
RDW: 12.7 % (ref 11.5–15.5)
WBC: 8.9 10*3/uL (ref 4.0–10.5)
nRBC: 0 % (ref 0.0–0.2)

## 2022-09-06 LAB — BASIC METABOLIC PANEL
Anion gap: 9 (ref 5–15)
BUN: 10 mg/dL (ref 6–20)
CO2: 24 mmol/L (ref 22–32)
Calcium: 8.5 mg/dL — ABNORMAL LOW (ref 8.9–10.3)
Chloride: 102 mmol/L (ref 98–111)
Creatinine, Ser: 0.91 mg/dL (ref 0.61–1.24)
GFR, Estimated: >60 mL/min (ref >60)
Glucose, Bld: 87 mg/dL (ref 70–99)
Potassium: 3.4 mmol/L — ABNORMAL LOW (ref 3.5–5.1)
Sodium: 135 mmol/L (ref 135–145)

## 2022-09-06 LAB — MAGNESIUM: Magnesium: 1.7 mg/dL (ref 1.7–2.4)

## 2022-09-06 MED ORDER — ENOXAPARIN SODIUM 40 MG/0.4ML IJ SOSY
40.0000 mg | PREFILLED_SYRINGE | INTRAMUSCULAR | Status: DC
  Filled 2022-09-06 (×3): qty 0.4

## 2022-09-06 MED ORDER — LACTATED RINGERS IV BOLUS
1000.0000 mL | Freq: Once | INTRAVENOUS | Status: AC
  Administered 2022-09-06: 1000 mL via INTRAVENOUS

## 2022-09-06 MED ORDER — CIPROFLOXACIN IN D5W 200 MG/100ML IV SOLN
200.0000 mg | Freq: Two times a day (BID) | INTRAVENOUS | Status: DC
  Filled 2022-09-06 (×3): qty 100

## 2022-09-06 MED ORDER — ACETAMINOPHEN 500 MG PO TABS
1000.0000 mg | ORAL_TABLET | Freq: Four times a day (QID) | ORAL | Status: DC | PRN
  Administered 2022-09-06: 1000 mg via ORAL
  Filled 2022-09-06: qty 2

## 2022-09-06 MED ORDER — LACTATED RINGERS IV SOLN: INTRAVENOUS | Status: DC

## 2022-09-06 MED ORDER — CIPROFLOXACIN IN D5W 400 MG/200ML IV SOLN
400.0000 mg | Freq: Two times a day (BID) | INTRAVENOUS | Status: DC
  Administered 2022-09-06 – 2022-09-08 (×5): 400 mg via INTRAVENOUS
  Filled 2022-09-06 (×5): qty 200

## 2022-09-06 MED ORDER — METRONIDAZOLE 500 MG/100ML IV SOLN
500.0000 mg | Freq: Two times a day (BID) | INTRAVENOUS | Status: DC
  Administered 2022-09-06 – 2022-09-08 (×5): 500 mg via INTRAVENOUS
  Filled 2022-09-06 (×5): qty 100

## 2022-09-06 MED ORDER — POTASSIUM CHLORIDE CRYS ER 20 MEQ PO TBCR
40.0000 meq | EXTENDED_RELEASE_TABLET | Freq: Two times a day (BID) | ORAL | Status: AC
  Administered 2022-09-06 (×2): 40 meq via ORAL
  Filled 2022-09-06 (×2): qty 2

## 2022-09-06 NOTE — Consult Note (Signed)
Reason for Consult:Recurrent diverticulitis Referring Physician: Maurilio Lovely, DO  Thomas Valdez is an 27 y.o. male with PMH of recurrent diverticulitis now with his 4th admission for diverticulitis. He was scheduled with Dr. Robyne Peers for 09/15/22 to evaluate for elective sigmoidoscopy but started having lower back pain last Thursday and abdominal pain Saturday morning, prompting him to come to the ED yesterday. His pain feels the same as his last admissions. He reports that he has had some N/V but has not been eating much recently to try to avoid recurrence of diverticulitis. His last BM was 2 days ago. His pain has not improved since being in the hospital.  Past Medical History:  Diagnosis Date   Diverticulitis    Syphilis    per patient treated at age 33    Past Surgical History:  Procedure Laterality Date   COLONOSCOPY WITH PROPOFOL N/A 08/23/2022   Procedure: COLONOSCOPY WITH PROPOFOL;  Surgeon: Corbin Ade, MD;  [facility];  Service: Endoscopy;  Laterality: N/A;  1:15 pm, asa 1   none     POLYPECTOMY  08/23/2022   Procedure: POLYPECTOMY;  Surgeon: Corbin Ade, MD;  [facility];  Service: Endoscopy;;    Family History  Problem Relation Age of Onset   Cancer Father        prostate   Heart disease Maternal Grandmother    Inflammatory bowel disease Neg Hx    Colon cancer Neg Hx     Social History: Reports that he smokes tobacco (hookah) socially and drinks alcohol socially. Denies any other substances.  Allergies: No Known Allergies  Medications: No regular medications, Tylenol PRN for back/abdominal pain  Results for orders placed or performed during the hospital encounter of 09/05/22 (from the past 48 hour(s))  CBC with Differential     Status: None   Collection Time: 09/05/22  2:40 PM  Result Value Ref Range   WBC 8.6 4.0 - 10.5 K/uL   RBC 4.46 4.22 - 5.81 MIL/uL   Hemoglobin 13.4 13.0 - 17.0 g/dL   HCT 89.3 81.0 - 17.5 %   MCV  89.2 80.0 - 100.0 fL   MCH 30.0 26.0 - 34.0 pg   MCHC 33.7 30.0 - 36.0 g/dL   RDW 10.2 58.5 - 27.7 %   Platelets 363 150 - 400 K/uL   nRBC 0.0 0.0 - 0.2 %   Neutrophils Relative % 74 %   Neutro Abs 6.3 1.7 - 7.7 K/uL   Lymphocytes Relative 19 %   Lymphs Abs 1.6 0.7 - 4.0 K/uL   Monocytes Relative 7 %   Monocytes Absolute 0.6 0.1 - 1.0 K/uL   Eosinophils Relative 0 %   Eosinophils Absolute 0.0 0.0 - 0.5 K/uL   Basophils Relative 0 %   Basophils Absolute 0.0 0.0 - 0.1 K/uL   Immature Granulocytes 0 %   Abs Immature Granulocytes 0.02 0.00 - 0.07 K/uL    Comment: Performed at [facility], [address], [place], [place] [address]  Comprehensive metabolic panel     Status: Abnormal   Collection Time: 09/05/22  2:40 PM  Result Value Ref Range   Sodium 135 135 - 145 mmol/L   Potassium 3.3 (L) 3.5 - 5.1 mmol/L   Chloride 102 98 - 111 mmol/L   CO2 25 22 - 32 mmol/L   Glucose, Bld 117 (H) 70 - 99 mg/dL    Comment: Glucose reference range applies only to samples taken after fasting for at least  8 hours.   BUN 12 6 - 20 mg/dL   Creatinine, Ser 2.44 0.61 - 1.24 mg/dL   Calcium 9.0 8.9 - 01.0 mg/dL   Total Protein 8.0 6.5 - 8.1 g/dL   Albumin 4.2 3.5 - 5.0 g/dL   AST 18 15 - 41 U/L   ALT 16 0 - 44 U/L   Alkaline Phosphatase 69 38 - 126 U/L   Total Bilirubin 0.8 0.3 - 1.2 mg/dL   GFR, Estimated >27 >25 mL/min    Comment: (NOTE) Calculated using the CKD-EPI Creatinine Equation (2021)    Anion gap 8 5 - 15    Comment: Performed at [facility], [address], [place], [place] [address]  Lipase, blood     Status: None   Collection Time: 09/05/22  2:40 PM  Result Value Ref Range   Lipase 27 11 - 51 U/L    Comment: Performed at [facility], [address] [place], [place] [address]  Urinalysis, Routine w reflex microscopic -Urine, Clean Catch     Status: None   Collection Time: 09/05/22  2:52 PM  Result Value Ref Range   Color, Urine YELLOW YELLOW   APPearance CLEAR CLEAR    Specific Gravity, Urine 1.013 1.005 - 1.030   pH 6.0 5.0 - 8.0   Glucose, UA NEGATIVE NEGATIVE mg/dL   Hgb urine dipstick NEGATIVE NEGATIVE   Bilirubin Urine NEGATIVE NEGATIVE   Ketones, ur NEGATIVE NEGATIVE mg/dL   Protein, ur NEGATIVE NEGATIVE mg/dL   Nitrite NEGATIVE NEGATIVE   Leukocytes,Ua NEGATIVE NEGATIVE    Comment: Performed at [facility], [address] [place], [place] [address]  Magnesium     Status: None   Collection Time: 09/06/22  3:53 AM  Result Value Ref Range   Magnesium 1.7 1.7 - 2.4 mg/dL    Comment: Performed at [facility], [address], [place], [place] [address]  CBC     Status: Abnormal   Collection Time: 09/06/22  3:53 AM  Result Value Ref Range   WBC 8.9 4.0 - 10.5 K/uL   RBC 4.18 (L) 4.22 - 5.81 MIL/uL   Hemoglobin 12.5 (L) 13.0 - 17.0 g/dL   HCT 43.3 (L) 29.5 - 18.8 %   MCV 90.7 80.0 - 100.0 fL   MCH 29.9 26.0 - 34.0 pg   MCHC 33.0 30.0 - 36.0 g/dL   RDW 41.6 60.6 - 30.1 %   Platelets 333 150 - 400 K/uL   nRBC 0.0 0.0 - 0.2 %    Comment: Performed at [facility], [address] [place], [place] [address]  Basic metabolic panel     Status: Abnormal   Collection Time: 09/06/22  3:53 AM  Result Value Ref Range   Sodium 135 135 - 145 mmol/L   Potassium 3.4 (L) 3.5 - 5.1 mmol/L   Chloride 102 98 - 111 mmol/L   CO2 24 22 - 32 mmol/L   Glucose, Bld 87 70 - 99 mg/dL    Comment: Glucose reference range applies only to samples taken after fasting for at least 8 hours.   BUN 10 6 - 20 mg/dL   Creatinine, Ser 3.23 0.61 - 1.24 mg/dL   Calcium 8.5 (L) 8.9 - 10.3 mg/dL   GFR, Estimated >55 >73 mL/min    Comment: (NOTE) Calculated using the CKD-EPI Creatinine Equation (2021)    Anion gap 9 5 - 15    Comment: Performed at [facility], [address], [place], [place] [address]  CT ABDOMEN PELVIS W CONTRAST  Result Date: 09/05/2022 CLINICAL DATA:  Left lower quadrant pain for three days. Diverticulitis. EXAM: CT ABDOMEN AND PELVIS WITH CONTRAST  TECHNIQUE: Multidetector CT imaging of the abdomen and pelvis was performed using the standard protocol following bolus administration of intravenous contrast. RADIATION DOSE REDUCTION: This exam was performed according to the departmental dose-optimization program which includes automated exposure control, adjustment of the mA and/or kV according to patient size and/or use of iterative reconstruction technique. CONTRAST:  OMNIPAQUE IOHEXOL 300 MG/ML  SOLN COMPARISON:  06/25/2022 FINDINGS: Lower Chest: No acute findings. Hepatobiliary: No suspicious hepatic masses identified. Gallbladder is unremarkable. No evidence of biliary ductal dilatation. Pancreas:  No mass or inflammatory changes. Spleen: Within normal limits in size and appearance. Adrenals/Urinary Tract: No suspicious masses identified. 2 mm right renal calculus noted. No evidence of ureteral calculi or hydronephrosis. Unremarkable unopacified urinary bladder. Stomach/Bowel: Moderate sigmoid diverticulitis shows mild improvement. Several tiny adjacent air bubbles are consistent with micro perforation. No abscess identified. No evidence of bowel obstruction. Previously seen thickening of adjacent small bowel loops has resolved. Tiny amount of free fluid again noted in pelvic cul-de-sac. Vascular/Lymphatic: No pathologically enlarged lymph nodes. No acute vascular findings. Reproductive:  No mass or other significant abnormality. Other:  None. Musculoskeletal:  No suspicious bone lesions identified. IMPRESSION: Mild improvement in moderate sigmoid diverticulitis, with evidence of microperforation. No evidence of abscess or bowel obstruction. Tiny right renal calculus. No evidence of ureteral calculi or hydronephrosis. Electronically Signed   By: Danae Orleans M.D.   On: 09/05/2022 16:57    ROS:  Pertinent items are noted in HPI.  Blood pressure 117/83, pulse 67, temperature 99.6 F (37.6 C), temperature source Oral, resp. rate 16, height 5\' 11"   (1.803 m), weight 78.5 kg, SpO2 100%. Physical Exam:  Gen: well appearing, in no acute distress Head: normocephalic, atraumatic CV: RRR, no MRG Ab: normal bowel sounds, tenderness to palpation below umbilicus  Assessment/Plan:  #Recurrent Diverticulitis Patient reports that he is willing to do "anything" to prevent recurrence. Recommend treating conservatively at this time and following up outpatient to reevaluate for elective sigmoidoscopy instead of urgent ostomy with bowel resection given the acute inflammation and complexity/risks of this procedure. Will continue clear liquids and advance diet as tolerated while controlling pain.  Thomas Valdez 09/06/2022, 10:37 AM

## 2022-09-06 NOTE — Discharge Instructions (Signed)

## 2022-09-06 NOTE — Progress Notes (Signed)
PROGRESS NOTE    ZIGMONT FEINGOLD  HYQ:[id number] DOB: [birth year] DOA: 09/05/2022 PCP: Patient, No Pcp Per   Brief Narrative:    Thomas Valdez is a 27 y.o. male with medical history significant for recurrent diverticulitis.  He was initially diagnosed at [facility] 2-1/2 months ago.  He had another hospitalization within the [facility] system a few days later.  Since that time he has followed up with GI and general surgery.  Patient had a colonoscopy 2 weeks ago by Dr. Kendell Bane.  Patient tells me after the colonoscopy it was recommended that he follow-up with general surgery to discuss colon resection.  The patient has an appointment in 2 weeks with Dr. Robyne Peers to discuss surgery.  3 days ago the patient started having abdominal pain which radiated into his back.  The pain has gotten worse since that time so he presented to the emergency department.   Assessment & Plan:   Principal Problem:   Diverticulitis  Assessment and Plan:   Recurrent  (vs persistent) Diverticulitis   -Clear liquid diet and advance as tolerated -IV fluid and pain control - IV Cipro and Flagyl - Await recommendations from GI and Surgery   Mild hypokalemia -Replete and reevaluate   DVT prophylaxis: Lovenox Code Status: Full Family Communication: None at bedside Disposition Plan:  Status is: Inpatient Remains inpatient appropriate because: Need for IV medications.   Consultants:  GI General surgery  Procedures:  None  Antimicrobials:  Anti-infectives (From admission, onward)    Start     Dose/Rate Route Frequency Ordered Stop   09/06/22 0600  ciprofloxacin (CIPRO) IVPB 200 mg        200 mg 100 mL/hr over 60 Minutes Intravenous Every 12 hours 09/06/22 0006     09/06/22 0400  metroNIDAZOLE (FLAGYL) IVPB 500 mg        500 mg 100 mL/hr over 60 Minutes Intravenous Every 12 hours 09/06/22 0008     09/05/22 1715  ciprofloxacin (CIPRO) IVPB 400 mg        400 mg 200 mL/hr over 60 Minutes Intravenous   Once 09/05/22 1707 09/05/22 1837   09/05/22 1715  metroNIDAZOLE (FLAGYL) IVPB 500 mg        500 mg 100 mL/hr over 60 Minutes Intravenous  Once 09/05/22 1707 09/05/22 1837      Subjective: Patient seen and evaluated today with increased abdominal pain that is radiating to his back.  He denies any specific nausea or vomiting.  Denies any bowel movements over the last couple days.  Objective: Vitals:   09/05/22 1813 09/05/22 1850 09/05/22 2110 09/06/22 0612  BP:  114/75 (!) 100/59 117/83  Pulse:  71 71 67  Resp:  18 18 16   Temp:  99.2 F (37.3 C) 98.7 F (37.1 C) 99.6 F (37.6 C)  TempSrc:  Oral Oral Oral  SpO2:  98% 99% 100%  Weight: 78.5 kg     Height: 5\' 11"  (1.803 m)       Intake/Output Summary (Last 24 hours) at 09/06/2022 0645 Last data filed at 09/06/2022 9528 Gross per 24 hour  Intake 2410.2 ml  Output --  Net 2410.2 ml   Filed Weights   09/05/22 1421 09/05/22 1813  Weight: 76.2 kg 78.5 kg    Examination:  General exam: Appears calm and comfortable  Respiratory system: Clear to auscultation. Respiratory effort normal. Cardiovascular system: S1 & S2 heard, RRR.  Gastrointestinal system: Abdomen is soft Central nervous system: Alert and awake Extremities: No edema Skin:  No significant lesions noted Psychiatry: Flat affect.    Data Reviewed: I have personally reviewed following labs and imaging studies  CBC: Recent Labs  Lab 09/05/22 1440 09/06/22 0353  WBC 8.6 8.9  NEUTROABS 6.3  --   HGB 13.4 12.5*  HCT 39.8 37.9*  MCV 89.2 90.7  PLT 363 333   Basic Metabolic Panel: Recent Labs  Lab 09/05/22 1440 09/06/22 0353  NA 135 135  K 3.3* 3.4*  CL 102 102  CO2 25 24  GLUCOSE 117* 87  BUN 12 10  CREATININE 0.88 0.91  CALCIUM 9.0 8.5*  MG  --  1.7   GFR: Estimated Creatinine Clearance: 128.7 mL/min (by C-G formula based on SCr of 0.91 mg/dL). Liver Function Tests: Recent Labs  Lab 09/05/22 1440  AST 18  ALT 16  ALKPHOS 69  BILITOT 0.8   PROT 8.0  ALBUMIN 4.2   Recent Labs  Lab 09/05/22 1440  LIPASE 27   No results for input(s): "AMMONIA" in the last 168 hours. Coagulation Profile: No results for input(s): "INR", "PROTIME" in the last 168 hours. Cardiac Enzymes: No results for input(s): "CKTOTAL", "CKMB", "CKMBINDEX", "TROPONINI" in the last 168 hours. BNP (last 3 results) No results for input(s): "PROBNP" in the last 8760 hours. HbA1C: No results for input(s): "HGBA1C" in the last 72 hours. CBG: No results for input(s): "GLUCAP" in the last 168 hours. Lipid Profile: No results for input(s): "CHOL", "HDL", "LDLCALC", "TRIG", "CHOLHDL", "LDLDIRECT" in the last 72 hours. Thyroid Function Tests: No results for input(s): "TSH", "T4TOTAL", "FREET4", "T3FREE", "THYROIDAB" in the last 72 hours. Anemia Panel: No results for input(s): "VITAMINB12", "FOLATE", "FERRITIN", "TIBC", "IRON", "RETICCTPCT" in the last 72 hours. Sepsis Labs: No results for input(s): "PROCALCITON", "LATICACIDVEN" in the last 168 hours.  No results found for this or any previous visit (from the past 240 hour(s)).       Radiology Studies: CT ABDOMEN PELVIS W CONTRAST  Result Date: 09/05/2022 CLINICAL DATA:  Left lower quadrant pain for three days. Diverticulitis. EXAM: CT ABDOMEN AND PELVIS WITH CONTRAST TECHNIQUE: Multidetector CT imaging of the abdomen and pelvis was performed using the standard protocol following bolus administration of intravenous contrast. RADIATION DOSE REDUCTION: This exam was performed according to the departmental dose-optimization program which includes automated exposure control, adjustment of the mA and/or kV according to patient size and/or use of iterative reconstruction technique. CONTRAST:  OMNIPAQUE IOHEXOL 300 MG/ML  SOLN COMPARISON:  06/25/2022 FINDINGS: Lower Chest: No acute findings. Hepatobiliary: No suspicious hepatic masses identified. Gallbladder is unremarkable. No evidence of biliary ductal  dilatation. Pancreas:  No mass or inflammatory changes. Spleen: Within normal limits in size and appearance. Adrenals/Urinary Tract: No suspicious masses identified. 2 mm right renal calculus noted. No evidence of ureteral calculi or hydronephrosis. Unremarkable unopacified urinary bladder. Stomach/Bowel: Moderate sigmoid diverticulitis shows mild improvement. Several tiny adjacent air bubbles are consistent with micro perforation. No abscess identified. No evidence of bowel obstruction. Previously seen thickening of adjacent small bowel loops has resolved. Tiny amount of free fluid again noted in pelvic cul-de-sac. Vascular/Lymphatic: No pathologically enlarged lymph nodes. No acute vascular findings. Reproductive:  No mass or other significant abnormality. Other:  None. Musculoskeletal:  No suspicious bone lesions identified. IMPRESSION: Mild improvement in moderate sigmoid diverticulitis, with evidence of microperforation. No evidence of abscess or bowel obstruction. Tiny right renal calculus. No evidence of ureteral calculi or hydronephrosis. Electronically Signed   By: Danae Orleans M.D.   On: 09/05/2022 16:57  Scheduled Meds: Continuous Infusions:  ciprofloxacin     lactated ringers 125 mL/hr at 09/06/22 0243   metronidazole 500 mg (09/06/22 0437)     LOS: 1 day    Time spent: 35 minutes    Aylin Rhoads Hoover Brunette, DO [facility]  If 7PM-7AM, please contact night-coverage [email or web address] 09/06/2022, 6:45 AM

## 2022-09-06 NOTE — TOC Initial Note (Signed)
Transition of Care [facility]) - Initial/Assessment Note    Patient Details  Name: Thomas Valdez MRN: [id number] Date of Birth: 1995-05-12  Transition of Care [facility]) CM/SW Contact:    Annice Needy, LCSW Phone Number: 09/06/2022, 10:11 AM  Clinical Narrative:                 Patient from home. Admitted for diverticulitis. Does not have a PCP. PCP list added to patient's d/c instructions. Patient is aware of PCP list and where to find it in his d/c paperwork.   Expected Discharge Plan: Home/Self Care Barriers to Discharge: Continued Medical Work up   Patient Goals and CMS Choice Patient states their goals for this hospitalization and ongoing recovery are:: d/c home          Expected Discharge Plan and Services       Living arrangements for the past 2 months: Single Family Home                                      Prior Living Arrangements/Services Living arrangements for the past 2 months: Single Family Home   Patient language and need for interpreter reviewed:: Yes        Need for Family Participation in Patient Care: No (Comment) Care giver support system in place?: No (comment)   Criminal Activity/Legal Involvement Pertinent to Current Situation/Hospitalization: No - Comment as needed  Activities of Daily Living Home Assistive Devices/Equipment: None ADL Screening (condition at time of admission) Patient's cognitive ability adequate to safely complete daily activities?: Yes Is the patient deaf or have difficulty hearing?: No Does the patient have difficulty seeing, even when wearing glasses/contacts?: No Does the patient have difficulty concentrating, remembering, or making decisions?: No Patient able to express need for assistance with ADLs?: No Does the patient have difficulty dressing or bathing?: No Independently performs ADLs?: Yes (appropriate for developmental age) Does the patient have difficulty walking or climbing stairs?: No Weakness of  Legs: None Weakness of Arms/Hands: None  Permission Sought/Granted                  Emotional Assessment     Affect (typically observed): Appropriate Orientation: : Oriented to Self, Oriented to Place, Oriented to  Time, Oriented to Situation Alcohol / Substance Use: Not Applicable Psych Involvement: No (comment)  Admission diagnosis:  Diverticulitis [K57.92] Diverticulitis of large intestine with complication [K57.32] Patient Active Problem List   Diagnosis Date Noted   Diverticulitis 09/05/2022   Abnormal CT scan, colon 08/04/2022   Diverticulitis of large intestine with abscess without bleeding 06/26/2022   Diverticulitis of colon with perforation 06/26/2022   Abdominal pain 06/25/2022   History of sexually transmitted disease 06/25/2022   Colonic diverticular abscess 08/10/2021   Humeral head fracture 06/11/2021   Establishing care with new doctor, encounter for 01/21/2021   Annual physical exam 01/21/2021   PCP:  Patient, No Pcp Per Pharmacy:   [id number] - [place], [place] - [address] [place] [address] Phone: [phone] Fax: [phone]     Social Determinants of Health (SDOH) Social History: SDOH Screenings   Food Insecurity: No Food Insecurity (09/05/2022)  Housing: Low Risk  (09/05/2022)  Transportation Needs: No Transportation Needs (09/05/2022)  Utilities: Not At Risk (09/05/2022)  Depression (PHQ2-9): Low Risk  (08/19/2021)  Financial Resource Strain: Low Risk  (06/23/2022)   Received from [facility], [facility]  Tobacco Use: Low Risk  (09/05/2022)   SDOH Interventions:     Readmission Risk Interventions     No data to display

## 2022-09-06 NOTE — Consult Note (Signed)
Gastroenterology Consult   Referring Provider: [facility] ED Primary Care Physician:  Patient, No Pcp Per Primary Gastroenterologist:  Dr. Jena Gauss   Patient ID: Thomas Valdez; [id number]; 01-12-1995   Admit date: 09/05/2022  LOS: 1 day   Date of Consultation: 09/06/2022  Reason for Consultation:  Recurrent diverticulitis   History of Present Illness   Thomas Valdez is a 27 y.o. year old male with a history of recurrent complicated diverticulitis as noted below, presenting now with recurrent abdominal pain and CT findings of improving sigmoid diverticulitis and evidence of microperforation. General Surgery has been consulted as well.   Patient notes continued pain in lower abdomen. No fever or chills. On clear liquid diet. Denies NSAIDs. Reports he has changed his diet significantly over the past months due to history of diverticulitis. No rectal bleeding. Colonoscopy recently a few weeks ago as noted below. He has upcoming outpatient appt for surgery consultation. He is frustrated with ongoing diverticulitis episodes.    CT yesterday on presentation to ED with mild improvement in moderate sigmoid diverticulitis and evidence of microperforation. WBC count normal. Cipro and Flagyl IV has been started. Hypokalemia on presentation with potassium 3.3. Receiving supplementation today.   Prior diverticulitis history:    In 07/2020 presented with right sided abdominal pain, CT done to rule out appendicitis but noted to have segmental wall thickening with mild pericolonic inflammatory changes involving proximal sigmoid colon. He was treated for possible diverticulitis at that time. One year later, he was admitted in 07/2021 with sigmoid diverticulitis complicated by abscess, too small for IR intervention.  He had another abnormal sigmoid colon on 10/2021 CT.    Admitted to [facility] June 17, 2022 through June 19, 2022 for for acute diverticulitis.  CT showed marked  inflammation of the upper anterior pelvis related to pronounced diverticulum extending caudally from the sigmoid colon, size of this single diverticulum could reflect contained microperforation however, no associated abscess at that time.  Treated with IV antibiotics and discharged home with 10-day supply of Augmentin.   Readmitted at [facility] with ongoing abdominal pain. CT June 25, 2022 with 2.7 cm abscess, IV Zosyn, discharged on Cipro and Flagyl.   Colonoscopy Aug 23, 2022: sigmoid and distal descending colon diverticulosis, changes of residual inflammation in the sigmoid segment, redundant sigmoid colon, one 5 mm polyp at splenic flexure (benign colonic mucosa with lymphoid aggregate)   Past Medical History:  Diagnosis Date   Diverticulitis    Syphilis    per patient treated at age 44    Past Surgical History:  Procedure Laterality Date   COLONOSCOPY WITH PROPOFOL N/A 08/23/2022   Procedure: COLONOSCOPY WITH PROPOFOL;  Surgeon: Corbin Ade, MD;  Location: [facility];  Service: Endoscopy;  Laterality: N/A;  1:15 pm, asa 1   none     POLYPECTOMY  08/23/2022   Procedure: POLYPECTOMY;  Surgeon: Corbin Ade, MD;  Location: [facility];  Service: Endoscopy;;    Prior to Admission medications   Medication Sig Start Date End Date Taking? Authorizing Provider  acetaminophen (TYLENOL) 500 MG tablet Take 2 tablets (1,000 mg total) by mouth every 6 (six) hours. 08/13/21  Yes Gillis Santa, MD  valACYclovir (VALTREX) 500 MG tablet Take 1 tablet (500 mg total) by mouth 2 (two) times daily. Patient taking differently: Take 500 mg by mouth 2 (two) times daily as needed (flare ups of cold sores). 07/09/22  Yes Waldon Merl, PA-C    Current Facility-Administered  Medications  Medication Dose Route Frequency Provider Last Rate Last Admin   ciprofloxacin (CIPRO) IVPB 400 mg  400 mg Intravenous Q12H Sherryll Burger, Pratik D, DO 200 mL/hr at 09/06/22 0841 400 mg at 09/06/22 0841   enoxaparin (LOVENOX)  injection 40 mg  40 mg Subcutaneous Q24H Sherryll Burger, Pratik D, DO       HYDROmorphone (DILAUDID) injection 0.5 mg  0.5 mg Intravenous Q3H PRN Buena Irish, MD       HYDROmorphone (DILAUDID) injection 1 mg  1 mg Intravenous Q3H PRN Buena Irish, MD   1 mg at 09/06/22 0981   lactated ringers infusion   Intravenous Continuous Maurilio Lovely D, DO 75 mL/hr at 09/06/22 0651 Rate Change at 09/06/22 0651   metroNIDAZOLE (FLAGYL) IVPB 500 mg  500 mg Intravenous Q12H Buena Irish, MD 100 mL/hr at 09/06/22 0437 500 mg at 09/06/22 0437   ondansetron (ZOFRAN) tablet 4 mg  4 mg Oral Q6H PRN Buena Irish, MD       Or   ondansetron [facility]) injection 4 mg  4 mg Intravenous Q6H PRN Buena Irish, MD   4 mg at 09/06/22 1914   Oral care mouth rinse  15 mL Mouth Rinse PRN Buena Irish, MD       potassium chloride SA (KLOR-CON M) CR tablet 40 mEq  40 mEq Oral BID Sherryll Burger, Pratik D, DO   40 mEq at 09/06/22 7829    Allergies as of 09/05/2022   (No Known Allergies)    Family History  Problem Relation Age of Onset   Cancer Father        prostate   Heart disease Maternal Grandmother    Inflammatory bowel disease Neg Hx    Colon cancer Neg Hx     Social History   Socioeconomic History   Marital status: Single    Spouse name: Not on file   Number of children: Not on file   Years of education: Not on file   Highest education level: Not on file  Occupational History   Not on file  Tobacco Use   Smoking status: Never   Smokeless tobacco: Never  Vaping Use   Vaping status: Never Used  Substance and Sexual Activity   Alcohol use: Yes    Comment: social   Drug use: Yes    Types: Marijuana   Sexual activity: Yes  Other Topics Concern   Not on file  Social History Narrative   Not on file   Social Determinants of Health   Financial Resource Strain: Low Risk  (06/23/2022)   [facility], [facility]   Overall Financial Resource  Strain (CARDIA)    Difficulty of Paying Living Expenses: Not hard at all  Food Insecurity: No Food Insecurity (09/05/2022)   Hunger Vital Sign    Worried About Running Out of Food in the Last Year: Never true    Ran Out of Food in the Last Year: Never true  Transportation Needs: No Transportation Needs (09/05/2022)   PRAPARE - Administrator, Civil Service (Medical): No    Lack of Transportation (Non-Medical): No  Physical Activity: Not on file  Stress: Not on file  Social Connections: Not on file  Intimate Partner Violence: Not At Risk (09/05/2022)   Humiliation, Afraid, Rape, and Kick questionnaire    Fear of Current or Ex-Partner: No    Emotionally Abused: No    Physically Abused: No    Sexually Abused: No  Review of Systems   Gen: Denies any fever, chills, loss of appetite, change in weight or weight loss CV: Denies chest pain, heart palpitations, syncope, edema  Resp: Denies shortness of breath with rest, cough, wheezing, coughing up blood, and pleurisy. GI: Denies vomiting blood, jaundice, and fecal incontinence.   Denies dysphagia or odynophagia. GU : Denies urinary burning, blood in urine, urinary frequency, and urinary incontinence. MS: Denies joint pain, limitation of movement, swelling, cramps, and atrophy.  Derm: Denies rash, itching, dry skin, hives. Psych: Denies depression, anxiety, memory loss, hallucinations, and confusion. Heme: Denies bruising or bleeding Neuro:  Denies any headaches, dizziness, paresthesias, shaking  Physical Exam   Vital Signs in last 24 hours: Temp:  [97.7 F (36.5 C)-99.6 F (37.6 C)] 99.6 F (37.6 C) (08/26 0612) Pulse Rate:  [67-113] 67 (08/26 0612) Resp:  [16-18] 16 (08/26 0612) BP: (100-125)/(59-89) 117/83 (08/26 0612) SpO2:  [98 %-100 %] 100 % (08/26 0612) Weight:  [76.2 kg-78.5 kg] 78.5 kg (08/25 1813) Last BM Date : 09/03/22  General:   Alert,  Well-developed, well-nourished, pleasant and cooperative in  NAD Head:  Normocephalic and atraumatic. Eyes:  Sclera clear, no icterus.   Conjunctiva pink. Ears:  Normal auditory acuity. Mouth:  No deformity or lesions, dentition normal. Lungs:  Clear throughout to auscultation.   No wheezes, crackles, or rhonchi. No acute distress. Heart:  S1 S2 present; no murmurs, clicks, rubs,  or gallops. Abdomen:  Soft, TTP suprapubic area and nondistended. No masses, hepatosplenomegaly or hernias noted. Normal bowel sounds, without guarding, and without rebound.   Rectal: deferred   Msk:  Symmetrical without gross deformities. Normal posture. Extremities:  Without clubbing or edema. Neurologic:  Alert and  oriented x4. Skin:  Intact without significant lesions or rashes. Psych:  Alert and cooperative. Normal mood and affect.  Intake/Output from previous day: 08/25 0701 - 08/26 0700 In: 2410.2 [I.V.:5.5; IV Piggyback:2404.7] Out: 150 [Urine:150] Intake/Output this shift: No intake/output data recorded.   Labs/Studies   Recent Labs Recent Labs    09/05/22 1440 09/06/22 0353  WBC 8.6 8.9  HGB 13.4 12.5*  HCT 39.8 37.9*  PLT 363 333   BMET Recent Labs    09/05/22 1440 09/06/22 0353  NA 135 135  K 3.3* 3.4*  CL 102 102  CO2 25 24  GLUCOSE 117* 87  BUN 12 10  CREATININE 0.88 0.91  CALCIUM 9.0 8.5*   LFT Recent Labs    09/05/22 1440  PROT 8.0  ALBUMIN 4.2  AST 18  ALT 16  ALKPHOS 69  BILITOT 0.8     Radiology/Studies CT ABDOMEN PELVIS W CONTRAST  Result Date: 09/05/2022 CLINICAL DATA:  Left lower quadrant pain for three days. Diverticulitis. EXAM: CT ABDOMEN AND PELVIS WITH CONTRAST TECHNIQUE: Multidetector CT imaging of the abdomen and pelvis was performed using the standard protocol following bolus administration of intravenous contrast. RADIATION DOSE REDUCTION: This exam was performed according to the departmental dose-optimization program which includes automated exposure control, adjustment of the mA and/or kV according to  patient size and/or use of iterative reconstruction technique. CONTRAST:  OMNIPAQUE IOHEXOL 300 MG/ML  SOLN COMPARISON:  06/25/2022 FINDINGS: Lower Chest: No acute findings. Hepatobiliary: No suspicious hepatic masses identified. Gallbladder is unremarkable. No evidence of biliary ductal dilatation. Pancreas:  No mass or inflammatory changes. Spleen: Within normal limits in size and appearance. Adrenals/Urinary Tract: No suspicious masses identified. 2 mm right renal calculus noted. No evidence of ureteral calculi or hydronephrosis. Unremarkable unopacified urinary  bladder. Stomach/Bowel: Moderate sigmoid diverticulitis shows mild improvement. Several tiny adjacent air bubbles are consistent with micro perforation. No abscess identified. No evidence of bowel obstruction. Previously seen thickening of adjacent small bowel loops has resolved. Tiny amount of free fluid again noted in pelvic cul-de-sac. Vascular/Lymphatic: No pathologically enlarged lymph nodes. No acute vascular findings. Reproductive:  No mass or other significant abnormality. Other:  None. Musculoskeletal:  No suspicious bone lesions identified. IMPRESSION: Mild improvement in moderate sigmoid diverticulitis, with evidence of microperforation. No evidence of abscess or bowel obstruction. Tiny right renal calculus. No evidence of ureteral calculi or hydronephrosis. Electronically Signed   By: Danae Orleans M.D.   On: 09/05/2022 16:57     Assessment   Thomas Valdez is a very pleasant 27 y.o. year old male with a history of recurrent complicated diverticulitis and prior admissions, presenting now with recurrent abdominal pain and CT findings of improving sigmoid diverticulitis and evidence of microperforation. General Surgery has been consulted as well.   Encouragingly, colonoscopy is on file from several weeks ago with sigmoid and distal descending colon diverticulosis, changes of residual inflammation in sigmoid. He had plans for  upcoming outpatient surgical evaluation for elective resection.  Patient presenting with recurrent complicated diverticulitis although scan does show some improvement. His symptoms over the past months are consistent with an underlying smoldering-type presentation.  Agree with IV antibiotics. Appreciate Surgical consultation. No need for acute surgical intervention at this time. He will certainly need close outpatient follow-up with Surgery to discuss outpatient procedure.     Plan / Recommendations    Supportive care Continue with IV antibiotics Clear liquid diet. Hopefully advance as tolerated over next 24-48 hours Appreciate surgery input.  No additional GI recommendations at this time.      09/06/2022, 9:23 AM  Thomas Mink, PhD, ANP-BC [facility]

## 2022-09-06 NOTE — Plan of Care (Signed)

## 2022-09-07 DIAGNOSIS — K5732 Diverticulitis of large intestine without perforation or abscess without bleeding: Secondary | ICD-10-CM | POA: Diagnosis not present

## 2022-09-07 DIAGNOSIS — R630 Anorexia: Secondary | ICD-10-CM

## 2022-09-07 DIAGNOSIS — K5792 Diverticulitis of intestine, part unspecified, without perforation or abscess without bleeding: Secondary | ICD-10-CM | POA: Diagnosis not present

## 2022-09-07 LAB — BASIC METABOLIC PANEL
Anion gap: 9 (ref 5–15)
BUN: 7 mg/dL (ref 6–20)
CO2: 26 mmol/L (ref 22–32)
Calcium: 8.8 mg/dL — ABNORMAL LOW (ref 8.9–10.3)
Chloride: 99 mmol/L (ref 98–111)
Creatinine, Ser: 0.97 mg/dL (ref 0.61–1.24)
GFR, Estimated: >60 mL/min (ref >60)
Glucose, Bld: 104 mg/dL — ABNORMAL HIGH (ref 70–99)
Potassium: 3.7 mmol/L (ref 3.5–5.1)
Sodium: 134 mmol/L — ABNORMAL LOW (ref 135–145)

## 2022-09-07 LAB — CBC
HCT: 39.1 % (ref 39.0–52.0)
Hemoglobin: 13.1 g/dL (ref 13.0–17.0)
MCH: 30.3 pg (ref 26.0–34.0)
MCHC: 33.5 g/dL (ref 30.0–36.0)
MCV: 90.3 fL (ref 80.0–100.0)
Platelets: 372 10*3/uL (ref 150–400)
RBC: 4.33 MIL/uL (ref 4.22–5.81)
RDW: 12.5 % (ref 11.5–15.5)
WBC: 11.4 10*3/uL — ABNORMAL HIGH (ref 4.0–10.5)
nRBC: 0 % (ref 0.0–0.2)

## 2022-09-07 LAB — MAGNESIUM: Magnesium: 1.9 mg/dL (ref 1.7–2.4)

## 2022-09-07 MED ORDER — ACETAMINOPHEN 500 MG PO TABS
1000.0000 mg | ORAL_TABLET | Freq: Four times a day (QID) | ORAL | Status: DC
  Administered 2022-09-07 – 2022-09-08 (×5): 1000 mg via ORAL
  Filled 2022-09-07 (×5): qty 2

## 2022-09-07 MED ORDER — KETOROLAC TROMETHAMINE 15 MG/ML IJ SOLN
15.0000 mg | Freq: Four times a day (QID) | INTRAMUSCULAR | Status: DC
  Administered 2022-09-07 – 2022-09-08 (×4): 15 mg via INTRAVENOUS
  Filled 2022-09-07 (×4): qty 1

## 2022-09-07 MED ORDER — METHOCARBAMOL 500 MG PO TABS
500.0000 mg | ORAL_TABLET | Freq: Three times a day (TID) | ORAL | Status: DC
  Administered 2022-09-07 – 2022-09-08 (×4): 500 mg via ORAL
  Filled 2022-09-07 (×4): qty 1

## 2022-09-07 NOTE — Progress Notes (Signed)
Subjective: Patient reports that his back and abdominal pain have not improved from yesterday. He was up walking some yesterday but could not go far due to pain. He has mainly eaten ice chips and says he wants to try to advance his diet today. Last hospitalizations for diverticulitis he says he usually had started feeling better by now, and is hopeful that today he will improve. He has had no BMs, no vomiting.  Objective: Vital signs in last 24 hours: Temp:  [98.2 F (36.8 C)-99.8 F (37.7 C)] 99.8 F (37.7 C) (08/27 0446) Pulse Rate:  [65-79] 79 (08/27 0446) Resp:  [16-18] 18 (08/27 0446) BP: (103-121)/(64-78) 103/66 (08/27 0446) SpO2:  [97 %-100 %] 97 % (08/27 0446) Last BM Date : 09/03/22  Intake/Output from previous day: 08/26 0701 - 08/27 0700 In: 1384.1 [P.O.:360; I.V.:824.1; IV Piggyback:200] Out: 300 [Urine:300] Intake/Output this shift: No intake/output data recorded.  Physical Exam Gen: in no acute distress Head: normocephalic, atraumatic Ab: normal bowel sounds, tenderness below umbilicus  Lab Results:  Recent Labs    09/06/22 0353 09/07/22 0403  WBC 8.9 11.4*  HGB 12.5* 13.1  HCT 37.9* 39.1  PLT 333 372   BMET Recent Labs    09/06/22 0353 09/07/22 0403  NA 135 134*  K 3.4* 3.7  CL 102 99  CO2 24 26  GLUCOSE 87 104*  BUN 10 7  CREATININE 0.91 0.97  CALCIUM 8.5* 8.8*   PT/INR No results for input(s): "LABPROT", "INR" in the last 72 hours.  Studies/Results: CT ABDOMEN PELVIS W CONTRAST  Result Date: 09/05/2022 CLINICAL DATA:  Left lower quadrant pain for three days. Diverticulitis. EXAM: CT ABDOMEN AND PELVIS WITH CONTRAST TECHNIQUE: Multidetector CT imaging of the abdomen and pelvis was performed using the standard protocol following bolus administration of intravenous contrast. RADIATION DOSE REDUCTION: This exam was performed according to the departmental dose-optimization program which includes automated exposure control, adjustment of the mA  and/or kV according to patient size and/or use of iterative reconstruction technique. CONTRAST:  OMNIPAQUE IOHEXOL 300 MG/ML  SOLN COMPARISON:  06/25/2022 FINDINGS: Lower Chest: No acute findings. Hepatobiliary: No suspicious hepatic masses identified. Gallbladder is unremarkable. No evidence of biliary ductal dilatation. Pancreas:  No mass or inflammatory changes. Spleen: Within normal limits in size and appearance. Adrenals/Urinary Tract: No suspicious masses identified. 2 mm right renal calculus noted. No evidence of ureteral calculi or hydronephrosis. Unremarkable unopacified urinary bladder. Stomach/Bowel: Moderate sigmoid diverticulitis shows mild improvement. Several tiny adjacent air bubbles are consistent with micro perforation. No abscess identified. No evidence of bowel obstruction. Previously seen thickening of adjacent small bowel loops has resolved. Tiny amount of free fluid again noted in pelvic cul-de-sac. Vascular/Lymphatic: No pathologically enlarged lymph nodes. No acute vascular findings. Reproductive:  No mass or other significant abnormality. Other:  None. Musculoskeletal:  No suspicious bone lesions identified. IMPRESSION: Mild improvement in moderate sigmoid diverticulitis, with evidence of microperforation. No evidence of abscess or bowel obstruction. Tiny right renal calculus. No evidence of ureteral calculi or hydronephrosis. Electronically Signed   By: Danae Orleans M.D.   On: 09/05/2022 16:57    Anti-infectives: Anti-infectives (From admission, onward)    Start     Dose/Rate Route Frequency Ordered Stop   09/06/22 0830  ciprofloxacin (CIPRO) IVPB 400 mg        400 mg 200 mL/hr over 60 Minutes Intravenous Every 12 hours 09/06/22 0737     09/06/22 0600  ciprofloxacin (CIPRO) IVPB 200 mg  Status:  Discontinued  200 mg 100 mL/hr over 60 Minutes Intravenous Every 12 hours 09/06/22 0006 09/06/22 0737   09/06/22 0400  metroNIDAZOLE (FLAGYL) IVPB 500 mg        500 mg 100  mL/hr over 60 Minutes Intravenous Every 12 hours 09/06/22 0008     09/05/22 1715  ciprofloxacin (CIPRO) IVPB 400 mg        400 mg 200 mL/hr over 60 Minutes Intravenous  Once 09/05/22 1707 09/05/22 1837   09/05/22 1715  metroNIDAZOLE (FLAGYL) IVPB 500 mg        500 mg 100 mL/hr over 60 Minutes Intravenous  Once 09/05/22 1707 09/05/22 1837       Assessment/Plan: Recurrent Diverticulitis Continue abx and advance diet as tolerated while controlling pain. No acute need for surgical intervention. Follow up outpatient to reevaluate for elective procedure.   LOS: 2 days    Thomas Valdez 09/07/2022

## 2022-09-07 NOTE — Progress Notes (Signed)
Subjective: Reports he is feeling much better this afternoon. This morning he was having pain, but was started on Toradol and states it is like night and day with how much better he is feeling.  He just ate 2 bowls of tomato soup and is feeling well.  No nausea or vomiting. No BM since Saturday, but passing gas.  Objective: Vital signs in last 24 hours: Temp:  [98.2 F (36.8 C)-99.8 F (37.7 C)] 98.8 F (37.1 C) (08/27 1311) Pulse Rate:  [65-79] 69 (08/27 1311) Resp:  [16-18] 18 (08/27 1311) BP: (103-121)/(64-78) 104/68 (08/27 1311) SpO2:  [97 %-100 %] 100 % (08/27 1311) Last BM Date : 09/07/22 General:   Alert and oriented, pleasant Head:  Normocephalic and atraumatic. Eyes:  No icterus, sclera clear. Conjuctiva pink.  Abdomen:  Bowel sounds present, soft, non-distended.  Mild TTP in the suprapubic area.  No rebound or guarding. No masses appreciated  Msk:  Symmetrical without gross deformities. Normal posture. Extremities:  Without edema. Neurologic:  Alert and  oriented x4;  grossly normal neurologically. Psych: Normal mood and affect.  Intake/Output from previous day: 08/26 0701 - 08/27 0700 In: 1384.1 [P.O.:360; I.V.:824.1; IV Piggyback:200] Out: 300 [Urine:300] Intake/Output this shift: No intake/output data recorded.  Lab Results: Recent Labs    09/05/22 1440 09/06/22 0353 09/07/22 0403  WBC 8.6 8.9 11.4*  HGB 13.4 12.5* 13.1  HCT 39.8 37.9* 39.1  PLT 363 333 372   BMET Recent Labs    09/05/22 1440 09/06/22 0353 09/07/22 0403  NA 135 135 134*  K 3.3* 3.4* 3.7  CL 102 102 99  CO2 25 24 26   GLUCOSE 117* 87 104*  BUN 12 10 7   CREATININE 0.88 0.91 0.97  CALCIUM 9.0 8.5* 8.8*   LFT Recent Labs    09/05/22 1440  PROT 8.0  ALBUMIN 4.2  AST 18  ALT 16  ALKPHOS 69  BILITOT 0.8    Studies/Results: CT ABDOMEN PELVIS W CONTRAST  Result Date: 09/05/2022 CLINICAL DATA:  Left lower quadrant pain for three days. Diverticulitis. EXAM: CT ABDOMEN AND  PELVIS WITH CONTRAST TECHNIQUE: Multidetector CT imaging of the abdomen and pelvis was performed using the standard protocol following bolus administration of intravenous contrast. RADIATION DOSE REDUCTION: This exam was performed according to the departmental dose-optimization program which includes automated exposure control, adjustment of the mA and/or kV according to patient size and/or use of iterative reconstruction technique. CONTRAST:  OMNIPAQUE IOHEXOL 300 MG/ML  SOLN COMPARISON:  06/25/2022 FINDINGS: Lower Chest: No acute findings. Hepatobiliary: No suspicious hepatic masses identified. Gallbladder is unremarkable. No evidence of biliary ductal dilatation. Pancreas:  No mass or inflammatory changes. Spleen: Within normal limits in size and appearance. Adrenals/Urinary Tract: No suspicious masses identified. 2 mm right renal calculus noted. No evidence of ureteral calculi or hydronephrosis. Unremarkable unopacified urinary bladder. Stomach/Bowel: Moderate sigmoid diverticulitis shows mild improvement. Several tiny adjacent air bubbles are consistent with micro perforation. No abscess identified. No evidence of bowel obstruction. Previously seen thickening of adjacent small bowel loops has resolved. Tiny amount of free fluid again noted in pelvic cul-de-sac. Vascular/Lymphatic: No pathologically enlarged lymph nodes. No acute vascular findings. Reproductive:  No mass or other significant abnormality. Other:  None. Musculoskeletal:  No suspicious bone lesions identified. IMPRESSION: Mild improvement in moderate sigmoid diverticulitis, with evidence of microperforation. No evidence of abscess or bowel obstruction. Tiny right renal calculus. No evidence of ureteral calculi or hydronephrosis. Electronically Signed   By: Dietrich Pates.D.  On: 09/05/2022 16:57    Assessment:  27 y.o. year old male with a history of recurrent complicated diverticulitis and prior admissions, presenting now with recurrent  abdominal pain and CT findings of improving sigmoid diverticulitis and evidence of microperforation.  GI and general surgery have been consulted for management.  Encouragingly, colonoscopy is on file from several weeks ago with sigmoid and distal descending colon diverticulosis, changes of residual inflammation in sigmoid. He had plans for upcoming outpatient surgical evaluation for elective resection, scheduled for 9/10.   He has been treated with IV cipro and flagyl this admission. Slight increase in white count this morning with WBC 11.4 (8.9 yesterday).  Patient had reported persistent abdominal pain this morning, but this has since significantly improved with the addition of ketorolac.  Tolerated full liquid diet for lunch.  No nausea or vomiting.  We will continue IV antibiotics and monitor for ongoing improvement in symptoms. No need for acute surgical intervention at this time.  He will need close follow-up outpatient with general surgery for consideration of elective sigmoid colectomy.    Plan: Continue IV antibiotics. Full liquid diet today.  Advance as tolerated. Monitor for worsening leukocytosis. May need to consider change in antibiotics and/or repeat abdominal imaging.  Continue supportive measures. Will need close follow-up outpatient with general surgery.  Would likely benefit from elective sigmoid colectomy once acute illness resolved.    LOS: 2 days    09/07/2022, 1:31 PM   Ermalinda Memos, [facility]

## 2022-09-07 NOTE — Progress Notes (Signed)
PROGRESS NOTE    [name]  WUJ:[id number] DOB: 1995-02-15 DOA: 09/05/2022 PCP: Patient, No Pcp Per   Brief Narrative:    Thomas Valdez is a 27 y.o. male with medical history significant for recurrent diverticulitis.  He was initially diagnosed at [facility] 2-1/2 months ago.  He had another hospitalization within the [facility] system a few days later.  Since that time he has followed up with GI and general surgery.  Patient had a colonoscopy 2 weeks ago by Dr. Kendell Bane.  Patient tells me after the colonoscopy it was recommended that he follow-up with general surgery to discuss colon resection.  The patient has an appointment in 2 weeks with Dr. Robyne Peers to discuss surgery.  3 days ago the patient started having abdominal pain which radiated into his back.  The pain has gotten worse since that time so he presented to the emergency department.   Assessment & Plan:   Principal Problem:   Diverticulitis Active Problems:   Poor appetite  Assessment and Plan:   Recurrent  (vs persistent) Diverticulitis   -Diet advance to full liquid today, advance further as tolerated -Continue pain control as ordered - IV Cipro and Flagyl -No plans for surgery at this time, appreciate ongoing GI recommendations   DVT prophylaxis: Lovenox Code Status: Full Family Communication: None at bedside Disposition Plan:  Status is: Inpatient Remains inpatient appropriate because: Need for IV medications.   Consultants:  GI General surgery  Procedures:  None  Antimicrobials:  Anti-infectives (From admission, onward)    Start     Dose/Rate Route Frequency Ordered Stop   09/06/22 0830  ciprofloxacin (CIPRO) IVPB 400 mg        400 mg 200 mL/hr over 60 Minutes Intravenous Every 12 hours 09/06/22 0737     09/06/22 0600  ciprofloxacin (CIPRO) IVPB 200 mg  Status:  Discontinued        200 mg 100 mL/hr over 60 Minutes Intravenous Every 12 hours 09/06/22 0006 09/06/22 0737   09/06/22 0400   metroNIDAZOLE (FLAGYL) IVPB 500 mg        500 mg 100 mL/hr over 60 Minutes Intravenous Every 12 hours 09/06/22 0008     09/05/22 1715  ciprofloxacin (CIPRO) IVPB 400 mg        400 mg 200 mL/hr over 60 Minutes Intravenous  Once 09/05/22 1707 09/05/22 1837   09/05/22 1715  metroNIDAZOLE (FLAGYL) IVPB 500 mg        500 mg 100 mL/hr over 60 Minutes Intravenous  Once 09/05/22 1707 09/05/22 1837      Subjective: Patient seen and evaluated today with increased abdominal pain that is radiating to his back.  He denies any specific nausea or vomiting.  He appears ready for some dietary advancement and has been advanced to full liquid per general surgery today.  Objective: Vitals:   09/06/22 0612 09/06/22 1404 09/06/22 2100 09/07/22 0446  BP: 117/83 121/78 113/64 103/66  Pulse: 67 72 65 79  Resp: 16 16 18 18   Temp: 99.6 F (37.6 C) 99.2 F (37.3 C) 98.2 F (36.8 C) 99.8 F (37.7 C)  TempSrc: Oral Oral Oral Oral  SpO2: 100% 99% 100% 97%  Weight:      Height:        Intake/Output Summary (Last 24 hours) at 09/07/2022 1143 Last data filed at 09/06/2022 1813 Gross per 24 hour  Intake 1384.05 ml  Output 300 ml  Net 1084.05 ml   Filed Weights   09/05/22 1421 09/05/22  1813  Weight: 76.2 kg 78.5 kg    Examination:  General exam: Appears calm and comfortable  Respiratory system: Clear to auscultation. Respiratory effort normal. Cardiovascular system: S1 & S2 heard, RRR.  Gastrointestinal system: Abdomen is soft Central nervous system: Alert and awake Extremities: No edema Skin: No significant lesions noted Psychiatry: Flat affect.    Data Reviewed: I have personally reviewed following labs and imaging studies  CBC: Recent Labs  Lab 09/05/22 1440 09/06/22 0353 09/07/22 0403  WBC 8.6 8.9 11.4*  NEUTROABS 6.3  --   --   HGB 13.4 12.5* 13.1  HCT 39.8 37.9* 39.1  MCV 89.2 90.7 90.3  PLT 363 333 372   Basic Metabolic Panel: Recent Labs  Lab 09/05/22 1440 09/06/22 0353  09/07/22 0403  NA 135 135 134*  K 3.3* 3.4* 3.7  CL 102 102 99  CO2 25 24 26   GLUCOSE 117* 87 104*  BUN 12 10 7   CREATININE 0.88 0.91 0.97  CALCIUM 9.0 8.5* 8.8*  MG  --  1.7 1.9   GFR: Estimated Creatinine Clearance: 120.8 mL/min (by C-G formula based on SCr of 0.97 mg/dL). Liver Function Tests: Recent Labs  Lab 09/05/22 1440  AST 18  ALT 16  ALKPHOS 69  BILITOT 0.8  PROT 8.0  ALBUMIN 4.2   Recent Labs  Lab 09/05/22 1440  LIPASE 27   No results for input(s): "AMMONIA" in the last 168 hours. Coagulation Profile: No results for input(s): "INR", "PROTIME" in the last 168 hours. Cardiac Enzymes: No results for input(s): "CKTOTAL", "CKMB", "CKMBINDEX", "TROPONINI" in the last 168 hours. BNP (last 3 results) No results for input(s): "PROBNP" in the last 8760 hours. HbA1C: No results for input(s): "HGBA1C" in the last 72 hours. CBG: No results for input(s): "GLUCAP" in the last 168 hours. Lipid Profile: No results for input(s): "CHOL", "HDL", "LDLCALC", "TRIG", "CHOLHDL", "LDLDIRECT" in the last 72 hours. Thyroid Function Tests: No results for input(s): "TSH", "T4TOTAL", "FREET4", "T3FREE", "THYROIDAB" in the last 72 hours. Anemia Panel: No results for input(s): "VITAMINB12", "FOLATE", "FERRITIN", "TIBC", "IRON", "RETICCTPCT" in the last 72 hours. Sepsis Labs: No results for input(s): "PROCALCITON", "LATICACIDVEN" in the last 168 hours.  No results found for this or any previous visit (from the past 240 hour(s)).       Radiology Studies: CT ABDOMEN PELVIS W CONTRAST  Result Date: 09/05/2022 CLINICAL DATA:  Left lower quadrant pain for three days. Diverticulitis. EXAM: CT ABDOMEN AND PELVIS WITH CONTRAST TECHNIQUE: Multidetector CT imaging of the abdomen and pelvis was performed using the standard protocol following bolus administration of intravenous contrast. RADIATION DOSE REDUCTION: This exam was performed according to the departmental dose-optimization program  which includes automated exposure control, adjustment of the mA and/or kV according to patient size and/or use of iterative reconstruction technique. CONTRAST:  OMNIPAQUE IOHEXOL 300 MG/ML  SOLN COMPARISON:  06/25/2022 FINDINGS: Lower Chest: No acute findings. Hepatobiliary: No suspicious hepatic masses identified. Gallbladder is unremarkable. No evidence of biliary ductal dilatation. Pancreas:  No mass or inflammatory changes. Spleen: Within normal limits in size and appearance. Adrenals/Urinary Tract: No suspicious masses identified. 2 mm right renal calculus noted. No evidence of ureteral calculi or hydronephrosis. Unremarkable unopacified urinary bladder. Stomach/Bowel: Moderate sigmoid diverticulitis shows mild improvement. Several tiny adjacent air bubbles are consistent with micro perforation. No abscess identified. No evidence of bowel obstruction. Previously seen thickening of adjacent small bowel loops has resolved. Tiny amount of free fluid again noted in pelvic cul-de-sac. Vascular/Lymphatic: No pathologically  enlarged lymph nodes. No acute vascular findings. Reproductive:  No mass or other significant abnormality. Other:  None. Musculoskeletal:  No suspicious bone lesions identified. IMPRESSION: Mild improvement in moderate sigmoid diverticulitis, with evidence of microperforation. No evidence of abscess or bowel obstruction. Tiny right renal calculus. No evidence of ureteral calculi or hydronephrosis. Electronically Signed   By: Danae Orleans M.D.   On: 09/05/2022 16:57        Scheduled Meds:  acetaminophen  1,000 mg Oral Q6H   enoxaparin (LOVENOX) injection  40 mg Subcutaneous Q24H   ketorolac  15 mg Intravenous Q6H   methocarbamol  500 mg Oral TID   Continuous Infusions:  ciprofloxacin 400 mg (09/07/22 0809)   lactated ringers Stopped (09/06/22 1756)   metronidazole 500 mg (09/07/22 0438)     LOS: 2 days    Time spent: 35 minutes    Draden Cottingham D Sherryll Burger, DO [facility]  If 7PM-7AM, please contact night-coverage [email or web address] 09/07/2022, 11:43 AM

## 2022-09-07 NOTE — Plan of Care (Signed)

## 2022-09-07 NOTE — Progress Notes (Signed)
N.O of scheduled tylenol, robaxin, and Toradol has been effective for pts pain relief. He has not had dilaudid since 0800. Pt is resting comfortably.

## 2022-09-07 NOTE — Progress Notes (Signed)
This nurse came into assess Pt and he states he would like to talk to the MD about his pain regimen, he feel that the dilaudid is not giving a long lasting relief and the pain is 9/10 and states "its radiating to my testicles. He states it was like that the last time I was here." He doesn't remember having to have so much dilaudid. Last time he states he had scheduled tylenol. Scheduled toradol and PRN oxy and dilaudid. On previous admission. Notified Dr. Sherryll Burger.

## 2022-09-08 DIAGNOSIS — K5792 Diverticulitis of intestine, part unspecified, without perforation or abscess without bleeding: Secondary | ICD-10-CM | POA: Diagnosis not present

## 2022-09-08 LAB — BASIC METABOLIC PANEL
Anion gap: 8 (ref 5–15)
BUN: 8 mg/dL (ref 6–20)
CO2: 27 mmol/L (ref 22–32)
Calcium: 8.7 mg/dL — ABNORMAL LOW (ref 8.9–10.3)
Chloride: 100 mmol/L (ref 98–111)
Creatinine, Ser: 0.99 mg/dL (ref 0.61–1.24)
GFR, Estimated: >60 mL/min (ref >60)
Glucose, Bld: 89 mg/dL (ref 70–99)
Potassium: 3.5 mmol/L (ref 3.5–5.1)
Sodium: 135 mmol/L (ref 135–145)

## 2022-09-08 LAB — CBC
HCT: 37.7 % — ABNORMAL LOW (ref 39.0–52.0)
Hemoglobin: 12.4 g/dL — ABNORMAL LOW (ref 13.0–17.0)
MCH: 29.7 pg (ref 26.0–34.0)
MCHC: 32.9 g/dL (ref 30.0–36.0)
MCV: 90.2 fL (ref 80.0–100.0)
Platelets: 377 10*3/uL (ref 150–400)
RBC: 4.18 MIL/uL — ABNORMAL LOW (ref 4.22–5.81)
RDW: 12.6 % (ref 11.5–15.5)
WBC: 7 10*3/uL (ref 4.0–10.5)
nRBC: 0 % (ref 0.0–0.2)

## 2022-09-08 LAB — MAGNESIUM: Magnesium: 2 mg/dL (ref 1.7–2.4)

## 2022-09-08 MED ORDER — CIPROFLOXACIN HCL 500 MG PO TABS: 500.0000 mg | ORAL_TABLET | Freq: Two times a day (BID) | ORAL | 0 refills | Status: AC

## 2022-09-08 MED ORDER — METRONIDAZOLE 500 MG PO TABS: 500.0000 mg | ORAL_TABLET | Freq: Two times a day (BID) | ORAL | 0 refills | Status: AC

## 2022-09-08 MED ORDER — OXYCODONE HCL 5 MG PO TABS
5.0000 mg | ORAL_TABLET | Freq: Three times a day (TID) | ORAL | 0 refills | Status: DC | PRN
Start: 2022-09-08 — End: 2022-09-21

## 2022-09-08 MED ORDER — ONDANSETRON 8 MG PO TBDP: 8.0000 mg | ORAL_TABLET | Freq: Three times a day (TID) | ORAL | 0 refills | Status: DC | PRN

## 2022-09-08 NOTE — Discharge Summary (Signed)
Physician Discharge Summary   Patient: Thomas Valdez MRN: [id number] DOB: 1995/02/10  Admit date:     09/05/2022  Discharge date: 09/08/22  Discharge Physician: Vassie Loll   PCP: Patient, No Pcp Per   Recommendations at discharge:  Repeat basic metabolic panel to follow his electrolytes and renal function Repeat CBC to follow hemoglobin/WBCs trending and instability.  Discharge Diagnoses: Principal Problem:   Diverticulitis Active Problems:   Diverticulitis of large intestine with complication   Poor appetite  Brief Narrative:  Thomas Valdez is a 27 y.o. male with medical history significant for recurrent diverticulitis.  He was initially diagnosed at [facility] 2-1/2 months ago.  He had another hospitalization within the [facility] system a few days later.  Since that time he has followed up with GI and general surgery.  Patient had a colonoscopy 2 weeks ago by Dr. Kendell Bane.  Patient tells me after the colonoscopy it was recommended that he follow-up with general surgery to discuss colon resection.  The patient has an appointment in 2 weeks with Dr. Robyne Peers to discuss surgery.  3 days ago the patient started having abdominal pain which radiated into his back.  The pain has gotten worse since that time so he presented to the emergency department.   Assessment and Plan: 1-recurrent (versus persistent) diverticulitis -Patient is afebrile, no nausea, no vomiting -Has demonstrated ability to tolerate diet and expressed no significant abdominal pain at the moment of discharge. -Patient will go home on oral antibiotics using ciprofloxacin and Flagyl for 10 more days.   -Patient will follow-up with general surgery as previously arranged for definitive treatment (most likely partial colectomy).  2-hypokalemia -Electrolytes has been repleted and within normal limits -Continue to follow electrolytes trend.  3-leukocytosis -In the setting of acute colitis process -Improved after  receiving fluid resuscitation and antibiotic therapy -At discharge white blood cell 7.0. -Repeat CBC to follow hemoglobin and white blood cells trend/stability.  Consultants: Gastroenterology service; general surgery. Procedures performed: See below for x-ray reports. Disposition: Home Diet recommendation: Low residue diet.  DISCHARGE MEDICATION: Allergies as of 09/08/2022   No Known Allergies      Medication List     TAKE these medications    acetaminophen 500 MG tablet Commonly known as: TYLENOL Take 2 tablets (1,000 mg total) by mouth every 6 (six) hours.   ciprofloxacin 500 MG tablet Commonly known as: Cipro Take 1 tablet (500 mg total) by mouth 2 (two) times daily for 10 days.   metroNIDAZOLE 500 MG tablet Commonly known as: Flagyl Take 1 tablet (500 mg total) by mouth 2 (two) times daily for 10 days.   ondansetron 8 MG disintegrating tablet Commonly known as: ZOFRAN-ODT Take 1 tablet (8 mg total) by mouth every 8 (eight) hours as needed for nausea or vomiting.   oxyCODONE 5 MG immediate release tablet Commonly known as: Roxicodone Take 1 tablet (5 mg total) by mouth every 8 (eight) hours as needed for severe pain.   valACYclovir 500 MG tablet Commonly known as: VALTREX Take 1 tablet (500 mg total) by mouth 2 (two) times daily. What changed:  when to take this reasons to take this        Discharge Exam: Filed Weights   09/05/22 1421 09/05/22 1813  Weight: 76.2 kg 78.5 kg   General exam: Alert, awake, oriented x 3 Respiratory system: Clear to auscultation. Respiratory effort normal. Cardiovascular system:RRR. No murmurs, rubs, gallops. Gastrointestinal system: Abdomen is nondistended, soft and nontender. No organomegaly or masses felt.  Normal bowel sounds heard. Central nervous system: Alert and oriented. No focal neurological deficits. Extremities: No C/C/E, +pedal pulses Skin: No rashes, lesions or ulcers Psychiatry: Judgement and insight appear  normal. Mood & affect appropriate.    Condition at discharge: Stable and improved.  The results of significant diagnostics from this hospitalization (including imaging, microbiology, ancillary and laboratory) are listed below for reference.   Imaging Studies: CT ABDOMEN PELVIS W CONTRAST  Result Date: 09/05/2022 CLINICAL DATA:  Left lower quadrant pain for three days. Diverticulitis. EXAM: CT ABDOMEN AND PELVIS WITH CONTRAST TECHNIQUE: Multidetector CT imaging of the abdomen and pelvis was performed using the standard protocol following bolus administration of intravenous contrast. RADIATION DOSE REDUCTION: This exam was performed according to the departmental dose-optimization program which includes automated exposure control, adjustment of the mA and/or kV according to patient size and/or use of iterative reconstruction technique. CONTRAST:  OMNIPAQUE IOHEXOL 300 MG/ML  SOLN COMPARISON:  06/25/2022 FINDINGS: Lower Chest: No acute findings. Hepatobiliary: No suspicious hepatic masses identified. Gallbladder is unremarkable. No evidence of biliary ductal dilatation. Pancreas:  No mass or inflammatory changes. Spleen: Within normal limits in size and appearance. Adrenals/Urinary Tract: No suspicious masses identified. 2 mm right renal calculus noted. No evidence of ureteral calculi or hydronephrosis. Unremarkable unopacified urinary bladder. Stomach/Bowel: Moderate sigmoid diverticulitis shows mild improvement. Several tiny adjacent air bubbles are consistent with micro perforation. No abscess identified. No evidence of bowel obstruction. Previously seen thickening of adjacent small bowel loops has resolved. Tiny amount of free fluid again noted in pelvic cul-de-sac. Vascular/Lymphatic: No pathologically enlarged lymph nodes. No acute vascular findings. Reproductive:  No mass or other significant abnormality. Other:  None. Musculoskeletal:  No suspicious bone lesions identified. IMPRESSION: Mild  improvement in moderate sigmoid diverticulitis, with evidence of microperforation. No evidence of abscess or bowel obstruction. Tiny right renal calculus. No evidence of ureteral calculi or hydronephrosis. Electronically Signed   By: Danae Orleans M.D.   On: 09/05/2022 16:57    Microbiology: Results for orders placed or performed during the hospital encounter of 06/25/22  Blood culture (routine x 2)     Status: None   Collection Time: 06/25/22 10:35 PM   Specimen: Left Antecubital; Blood  Result Value Ref Range Status   Specimen Description LEFT ANTECUBITAL  Final   Special Requests   Final    BOTTLES DRAWN AEROBIC AND ANAEROBIC Blood Culture adequate volume   Culture   Final    NO GROWTH 5 DAYS Performed at [facility], [address] [place], [place] [address]    Report Status 06/30/2022 FINAL  Final  Blood culture (routine x 2)     Status: None   Collection Time: 06/25/22 10:37 PM   Specimen: BLOOD LEFT HAND  Result Value Ref Range Status   Specimen Description BLOOD LEFT HAND  Final   Special Requests   Final    BOTTLES DRAWN AEROBIC AND ANAEROBIC Blood Culture adequate volume   Culture   Final    NO GROWTH 5 DAYS Performed at [facility], [address] [place], [place] [address]    Report Status 06/30/2022 FINAL  Final    Labs: CBC: Recent Labs  Lab 09/05/22 1440 09/06/22 0353 09/07/22 0403 09/08/22 0400  WBC 8.6 8.9 11.4* 7.0  NEUTROABS 6.3  --   --   --   HGB 13.4 12.5* 13.1 12.4*  HCT 39.8 37.9* 39.1 37.7*  MCV 89.2 90.7 90.3 90.2  PLT 363 333 372 377   Basic  Metabolic Panel: Recent Labs  Lab 09/05/22 1440 09/06/22 0353 09/07/22 0403 09/08/22 0400  NA 135 135 134* 135  K 3.3* 3.4* 3.7 3.5  CL 102 102 99 100  CO2 25 24 26 27   GLUCOSE 117* 87 104* 89  BUN 12 10 7 8   CREATININE 0.88 0.91 0.97 0.99  CALCIUM 9.0 8.5* 8.8* 8.7*  MG  --  1.7 1.9 2.0   Liver Function Tests: Recent Labs  Lab 09/05/22 1440  AST 18  ALT 16  ALKPHOS 69  BILITOT 0.8   PROT 8.0  ALBUMIN 4.2   CBG: No results for input(s): "GLUCAP" in the last 168 hours.  Discharge time spent: greater than 30 minutes.  Signed: Vassie Loll, MD [facility] 09/08/2022

## 2022-09-08 NOTE — Plan of Care (Signed)

## 2022-09-08 NOTE — Progress Notes (Signed)
  Subjective: Patient reports that he is feeling much better and is ready to go home. His back and abdominal pain have both improved. He has been able to eat soup and ice cream. He has had some nausea but no vomiting. He thinks his nausea is from pain medications on an empty stomach. He has been up walking around. No BM since admission. Patient is agreeable to the plan to complete abx course and follow up with surgery outpatient for future elective partial colectomy.  Objective: Vital signs in last 24 hours: Temp:  [98.5 F (36.9 C)-98.9 F (37.2 C)] 98.9 F (37.2 C) (08/28 0451) Pulse Rate:  [66-74] 66 (08/28 0451) Resp:  [16-18] 16 (08/28 0451) BP: (92-118)/(52-73) 92/52 (08/28 0451) SpO2:  [99 %-100 %] 99 % (08/28 0451) Last BM Date : 09/07/22  Intake/Output from previous day: 08/27 0701 - 08/28 0700 In: 1440 [P.O.:1440] Out: -  Intake/Output this shift: Total I/O In: 600 [IV Piggyback:600] Out: -   Physical Exam Gen: alert, well appearing, in no acute distress Head: normocephalic, atraumatic Ab: normal bowel sounds, mild tenderness below umbilicus  Lab Results:  Recent Labs    09/07/22 0403 09/08/22 0400  WBC 11.4* 7.0  HGB 13.1 12.4*  HCT 39.1 37.7*  PLT 372 377   BMET Recent Labs    09/07/22 0403 09/08/22 0400  NA 134* 135  K 3.7 3.5  CL 99 100  CO2 26 27  GLUCOSE 104* 89  BUN 7 8  CREATININE 0.97 0.99  CALCIUM 8.8* 8.7*   PT/INR No results for input(s): "LABPROT", "INR" in the last 72 hours.  Studies/Results: No results found.  Anti-infectives: Anti-infectives (From admission, onward)    Start     Dose/Rate Route Frequency Ordered Stop   09/06/22 0830  ciprofloxacin (CIPRO) IVPB 400 mg        400 mg 200 mL/hr over 60 Minutes Intravenous Every 12 hours 09/06/22 0737     09/06/22 0600  ciprofloxacin (CIPRO) IVPB 200 mg  Status:  Discontinued        200 mg 100 mL/hr over 60 Minutes Intravenous Every 12 hours 09/06/22 0006 09/06/22 0737    09/06/22 0400  metroNIDAZOLE (FLAGYL) IVPB 500 mg        500 mg 100 mL/hr over 60 Minutes Intravenous Every 12 hours 09/06/22 0008     09/05/22 1715  ciprofloxacin (CIPRO) IVPB 400 mg        400 mg 200 mL/hr over 60 Minutes Intravenous  Once 09/05/22 1707 09/05/22 1837   09/05/22 1715  metroNIDAZOLE (FLAGYL) IVPB 500 mg        500 mg 100 mL/hr over 60 Minutes Intravenous  Once 09/05/22 1707 09/05/22 1837       Assessment/Plan: Recurrent vs. Persistent Diverticulitis Patient's pain and appetite have improved, with WBC now WNL. He is stable for discharge, likely d/c today. Complete abx course and follow up with general surgery outpatient for future partial colectomy. Goal of at least 6 weeks with no acute diverticular disease before procedure.   LOS: 3 days    Barrett Shell 09/08/2022

## 2022-09-15 ENCOUNTER — Ambulatory Visit: Payer: Federal, State, Local not specified - PPO | Admitting: Surgery

## 2022-09-21 ENCOUNTER — Ambulatory Visit: Payer: Federal, State, Local not specified - PPO | Admitting: Surgery

## 2022-09-21 ENCOUNTER — Ambulatory Visit (INDEPENDENT_AMBULATORY_CARE_PROVIDER_SITE_OTHER): Payer: Federal, State, Local not specified - PPO | Admitting: Surgery

## 2022-09-21 ENCOUNTER — Encounter: Payer: Self-pay | Admitting: Surgery

## 2022-09-21 VITALS — BP 108/73 | HR 73 | Temp 98.1°F | Resp 12 | Ht 71.0 in | Wt 169.0 lb

## 2022-09-21 DIAGNOSIS — K5792 Diverticulitis of intestine, part unspecified, without perforation or abscess without bleeding: Secondary | ICD-10-CM

## 2022-09-24 NOTE — Progress Notes (Signed)
[facility] Note   HPI:  27 y.o. Male presents to clinic for follow-up after recent episode of diverticulitis.  The patient was seen this past spring for an episode of diverticulitis.  Since that time, he had been doing well.  He underwent a colonoscopy with Dr. Jena Gauss this past August (8/12), which demonstrated diverticulosis of the sigmoid and distal descending colon and polyp of the splenic flexure.  He has been doing well since the colonoscopy, but started having a flare of his diverticulitis like symptoms on 8/25 which prompted admission to the hospital for diverticulitis.  Since being discharged home, he has been doing well.  He completed his course of antibiotics.  He is tolerating a diet without nausea and vomiting, moving his bowels without issue.  Denies fevers and chills.  He currently denies any abdominal pain  Review of Systems:  All other review of systems: otherwise negative   Vital Signs:  BP 108/73   Pulse 73   Temp 98.1 F (36.7 C) (Oral)   Resp 12   Ht 5\' 11"  (1.803 m)   Wt 169 lb (76.7 kg)   SpO2 98%   BMI 23.57 kg/m    Physical Exam:  Physical Exam Vitals reviewed.  Constitutional:      Appearance: Normal appearance.  Abdominal:     Comments: Abdomen soft, nondistended, no percussion tenderness, nontender to palpation; no rigidity, guarding, rebound tenderness  Neurological:     Mental Status: He is alert.     Laboratory studies: None  Imaging:  None  Assessment:  27 y.o. yo Male who presents for follow-up status post recent bout of diverticulitis on 8/25.  Plan:  -We again discussed the pathophysiology of diverticulitis, and the need for surgical intervention for this patient given his multiple episodes of diverticulitis in the past -We further discussed that given his most recent episode was only a week and a half ago, we will need to wait 4 to 6 weeks before we can schedule him for an elective colectomy -I explained to the patient  that if we were to proceed with surgery at this time, he would likely end up with a colostomy given the active inflammation.  We also discussed that at the time of elective colectomy, he may also need a colostomy if there is significant residual inflammation.  If he has another bout of diverticulitis between now and his next follow-up visit, we may need to proceed with more urgent surgery, which could again result with him requiring a colostomy.  Patient is understanding and agreeable to all of this -Follow up with me in 6 weeks, at which time we will plan to schedule him for elective colectomy -Advised that if he begins to have similar symptoms like his previous episodes of diverticulitis, he needs to present to the emergency department for evaluation  All of the above recommendations were discussed with the patient, and all of patient's questions were answered to his expressed satisfaction.  Theophilus Kinds, DO [facility] [address] [place], [place] [address] [phone] (office)

## 2022-10-04 ENCOUNTER — Telehealth: Payer: Self-pay | Admitting: Family Medicine

## 2022-10-04 MED ORDER — AMOXICILLIN-POT CLAVULANATE 875-125 MG PO TABS: 1.0000 | ORAL_TABLET | Freq: Two times a day (BID) | ORAL | 0 refills | Status: DC

## 2022-10-04 NOTE — Telephone Encounter (Signed)
Patient LMOVM and states that he is having a flare in his diverticulosis.  I called and left message on his vm to call back with sxs. In the meantime I sent message to Dr. Robyne Peers and she want Augmentin sent to pharm along with the recommendations of,  If he starts to have severe, uncontrolled pain, N/V, fevers or chills, he needs to present to the ED for evaluation.   Medication sent to pharm and patient aware via mychart.

## 2022-10-05 ENCOUNTER — Encounter (HOSPITAL_COMMUNITY): Payer: Self-pay | Admitting: Internal Medicine

## 2022-10-18 ENCOUNTER — Telehealth: Payer: Federal, State, Local not specified - PPO | Admitting: Physician Assistant

## 2022-10-18 DIAGNOSIS — A6001 Herpesviral infection of penis: Secondary | ICD-10-CM

## 2022-10-18 MED ORDER — VALACYCLOVIR HCL 500 MG PO TABS
500.0000 mg | ORAL_TABLET | Freq: Two times a day (BID) | ORAL | 0 refills | Status: AC
Start: 2022-10-18 — End: 2022-10-21

## 2022-10-18 NOTE — Progress Notes (Signed)
E-Visit for Herpes Simplex  We are sorry that you are not feeling well.  Here is how we plan to help!  Based on what you have shared ith me, it looks like you may be having an outbreak/flare-up of genital herpes.    I have prescribed I have prescribed Valacyclovir 500 mg Take one by mouth twice a day for 3 days.    If you have been prescribed long term medications to be taken on a regular basis, it is important to follow the recommendations and take them as ordered.    Outbreaks usually include blisters and open sores in the genital area. Outbreaks that happen after the first time are usually not as severe and do not last as long. Genital Herpes Simplex is a commonly sexually transmitted viral infection that is found worldwide. Most of these genital infections are caused by one or two herpes simplex viruses that is passed from person to person during vaginal, oral, or anal sex. Sometimes, people do not know they have herpes because they do not have any symptoms.  Please be aware that if you have genital herpes you can be contagious even when you are not having rash or flare-up and you may not have any symptoms, even when you are taking suppressive medicines.  Herpes cannot be cured. The disease usually causes most problems during the first few years. After that, the virus is still there, but it causes few to no symptoms. Even when the virus is active, people with herpes can take medicines to reduce and help prevent symptoms.  Herpes is an infection that can cause blisters and open sores on the genital area. Herpes is caused by a virus that is passed from person to person during vaginal, oral, or anal sex. Sometimes, people do not know they have herpes because they do not have any symptoms. Herpes cannot be cured. The disease usually causes most problems during the first few years. After that, the virus is still there, but it causes few to no symptoms. Even when the virus is active, people with herpes  can take medicines to reduce and help prevent symptoms.  If you have been prescribed medications to be taken on a regular basis, it is important to follow the recommendations and take them as ordered.  Some people with herpes never have any symptoms. But other people can develop symptoms within a few weeks of being infected with the herpes virus   Symptoms usually include blisters in the genital area. In women, this area includes the vagina, buttocks, anus, or thighs. In men, this area includes the penis, scrotum, anus, butt, or thighs. The blisters can become painful open sores, which then crust over as they heal. Sometimes, people can have other symptoms that include:  ?Blisters on the mouth or lips ?Fever, headache, or pain in the joints ?Trouble urinating  Outbreaks might occur every month or more often, or just once or twice a year. Sometimes, people can tell when an outbreak will occur, because they feel itching or pain beforehand. Sometimes they do not know that an outbreak is coming because they have no symptoms. Whatever your pattern is, keep in mind that herpes outbreaks usually become less frequent over time as you get older. Certain things, called "triggers," can make outbreaks more likely to occur. These include stress, sunlight, menstrual periods,or getting sick.  Antiviral therapy can shorten the duration of symptoms and signs in primary infection, which, when untreated, can be associated with significant increase in the   symptoms of the disease.  HOME CARE Use a portable bath (such as a "Sitz bath") where you can sit in warm water for about 20 minutes. Your bathtub could also work. Avoid bubble baths.  Keep the genital area clean and dry and avoid tight clothes.  Take over-the-counter pain medicine such as acetaminophen (brand name: Tylenol) or ibuprofen sample brand names: Advil, Motrin). But avoid aspirin.  Only take medications as instructed by your medical team.  You are  most likely to spread herpes to a sex partner when you have blisters and open sores on your body. But it's also possible to spread herpes to your partner when you do not have any symptoms. That is because herpes can be present on your body without causing any symptoms, like blisters or pain.  Telling your sex partner that you have herpes can be hard. But it can help protect them, since there are ways to lower the risk of spreading the infection.   Using a condom every time you have sex  Not having sex when you have symptoms  Not having oral sex if you have blisters or open sores (in the genital area or around your mouth)  MAKE SURE YOU   Understand these instructions. Do not have sex without using a condom until you have been seen by a doctor and as instructed by the provider If you are not better or improved within 7 days, you MUST have a follow up at your doctor or the health department for evaluation. There are other causes of rashes in the genital region.  Thank you for choosing an e-visit.  Your e-visit answers were reviewed by a board certified advanced clinical practitioner to complete your personal care plan. Depending upon the condition, your plan could have included both over the counter or prescription medications.  Please review your pharmacy choice. Make sure the pharmacy is open so you can pick up prescription now. If there is a problem, you may contact your provider through MyChart messaging and have the prescription routed to another pharmacy.  Your safety is important to us. If you have drug allergies check your prescription carefully.   For the next 24 hours you can use MyChart to ask questions about today's visit, request a non-urgent call back, or ask for a work or school excuse. You will get an email in the next two days asking about your experience. I hope that your e-visit has been valuable and will speed your recovery.   I have spent 5 minutes in review of e-visit  questionnaire, review and updating patient chart, medical decision making and response to patient.   Deric Bocock M Wenzel Backlund, PA-C  

## 2022-11-02 ENCOUNTER — Encounter: Payer: Self-pay | Admitting: Surgery

## 2022-11-02 ENCOUNTER — Encounter: Payer: Self-pay | Admitting: *Deleted

## 2022-11-02 ENCOUNTER — Ambulatory Visit (INDEPENDENT_AMBULATORY_CARE_PROVIDER_SITE_OTHER): Payer: Federal, State, Local not specified - PPO | Admitting: Surgery

## 2022-11-02 VITALS — BP 118/79 | HR 75 | Temp 98.4°F | Resp 12 | Ht 71.0 in | Wt 167.0 lb

## 2022-11-02 DIAGNOSIS — K5792 Diverticulitis of intestine, part unspecified, without perforation or abscess without bleeding: Secondary | ICD-10-CM

## 2022-11-02 MED ORDER — METRONIDAZOLE 500 MG PO TABS: 500.0000 mg | ORAL_TABLET | ORAL | 0 refills | Status: DC

## 2022-11-02 MED ORDER — BISACODYL 5 MG PO TBEC: 20.0000 mg | DELAYED_RELEASE_TABLET | Freq: Once | ORAL | 0 refills | Status: AC

## 2022-11-02 MED ORDER — NEOMYCIN SULFATE 500 MG PO TABS: 1000.0000 mg | ORAL_TABLET | ORAL | 0 refills | Status: DC

## 2022-11-02 MED ORDER — POLYETHYLENE GLYCOL 3350 17 GM/SCOOP PO POWD: 1.0000 | Freq: Once | ORAL | 0 refills | Status: AC

## 2022-11-02 NOTE — Patient Instructions (Addendum)
Buy from the Store: Miralax bottle (288g).  Gatorade 64 oz (not red). Dulcolax tablets.   The Day Prior to Surgery: Take 4 ducolax tablets at 7am with water. Drink plenty of clear liquids all day to avoid dehydration, no solid food.    Mix the bottle of Miralax and 64 oz of Gatorade and drink this mixture starting at 10am. Drink it gradually over the next few hours, 8 ounces every 15-30 minutes until it is gone. Finish this by 2pm.  Take 2 neomycin 500mg tablets and 2 metronidazole 500mg tablets at 2 pm. Take 2 neomycin 500mg tablets and 2 metronidazole 500mg tablets at 3pm. Take 2 neomycin 500mg tablets and 2 metronidazole 500mg tablets at 10pm.    Do not eat or drink anything after midnight the night before your surgery.  Do not eat or drink anything that morning, and take medications as instructed by the hospital staff on your preoperative visit.    

## 2022-11-02 NOTE — Progress Notes (Unsigned)
[facility] Note   HPI:  27 y.o. Male presents to clinic for follow-up to discuss colectomy for his history of diverticulitis.  Since the last visit in the office, the patient thought that he had an episode of diverticulitis occurring.  He states that his pain got significantly better within a day, and he believes that he just had an episode of food poisoning.  He did not take the prescribed antibiotics.  He denies any abdominal pain, nausea, and vomiting.  He is moving his bowels without issue.  He denies any symptoms like what has previously resulted in his admissions to the hospital for diverticulitis.  Denies fevers and chills.  Review of Systems:  All other review of systems: otherwise negative   Vital Signs:  BP 118/79   Pulse 75   Temp 98.4 F (36.9 C) (Oral)   Resp 12   Ht 5\' 11"  (1.803 m)   Wt 167 lb (75.8 kg)   SpO2 98%   BMI 23.29 kg/m    Physical Exam:  Physical Exam Vitals reviewed.  Constitutional:      Appearance: Normal appearance.  Abdominal:     Comments: Abdomen soft, nondistended, no percussion tenderness, nontender to palpation; no rigidity, guarding, rebound tenderness  Neurological:     Mental Status: He is alert.     Laboratory studies: None  Imaging:  None  Assessment:  27 y.o. yo Male who presents to discuss elective colectomy for diverticulitis  Plan:  -We discussed that since he has had multiple episodes of diverticulitis that are life altering and have required admission to the hospital, it is appropriate to consider colectomy.  The goal of waiting until he was not having an acute flare of diverticulitis was to allow for [place] to create an anastomosis at the time of surgery.  Depending on the amount of inflammation present within the patient's abdomen surgery, he may require creation of a diverting loop ileostomy to allow this area to heal.  There is a small chance that he may require an end colostomy. -We also discussed that the  goal of the surgery is to remove the area of the colon that has been causing him significant issues.  If he has diverticulosis and other areas of the colon, he is at risk of developing diverticulitis at these areas in the future -The risks and benefits of robotic assisted laparoscopic sigmoidectomy with possible creation of loop ileostomy were discussed with the patient, including but not limited to bleeding, infection, injury to surrounding structures, and need for additional procedures. Xain Petska has decided to proceed with surgery -We discussed the need for a mechanical and antibiotic prep prior to surgery.  Prescriptions provided for MiraLAX prep and Flagyl/neomycin -We discussed that this is an inpatient surgery for which she will be admitted after to verify that he progresses appropriately after surgery -Information was provided to the patient regarding colectomy, ileostomy, diverticulitis, and diverticulosis. -Patient tentatively scheduled for surgery on 11/8  All of the above recommendations were discussed with the patient and patient's family, and all of patient's and family's questions were answered to their expressed satisfaction.  Theophilus Kinds, DO [facility] [address] [place], [place] [address] [phone] (office)

## 2022-11-11 ENCOUNTER — Other Ambulatory Visit: Payer: Self-pay | Admitting: Family Medicine

## 2022-11-11 DIAGNOSIS — K4090 Unilateral inguinal hernia, without obstruction or gangrene, not specified as recurrent: Secondary | ICD-10-CM

## 2022-11-12 ENCOUNTER — Other Ambulatory Visit: Payer: Self-pay

## 2022-11-12 ENCOUNTER — Encounter (HOSPITAL_COMMUNITY): Payer: Self-pay | Admitting: Radiology

## 2022-11-12 ENCOUNTER — Emergency Department (HOSPITAL_COMMUNITY): Payer: Federal, State, Local not specified - PPO

## 2022-11-12 ENCOUNTER — Emergency Department (HOSPITAL_COMMUNITY)
Admission: EM | Admit: 2022-11-12 | Discharge: 2022-11-13 | Disposition: A | Discharge status: Home / Self Care | Payer: Federal, State, Local not specified - PPO

## 2022-11-12 DIAGNOSIS — R1032 Left lower quadrant pain: Secondary | ICD-10-CM | POA: Diagnosis not present

## 2022-11-12 DIAGNOSIS — K5792 Diverticulitis of intestine, part unspecified, without perforation or abscess without bleeding: Secondary | ICD-10-CM | POA: Diagnosis not present

## 2022-11-12 DIAGNOSIS — K5730 Diverticulosis of large intestine without perforation or abscess without bleeding: Secondary | ICD-10-CM | POA: Diagnosis not present

## 2022-11-12 DIAGNOSIS — R112 Nausea with vomiting, unspecified: Secondary | ICD-10-CM | POA: Insufficient documentation

## 2022-11-12 LAB — URINALYSIS, ROUTINE W REFLEX MICROSCOPIC
Bacteria, UA: NONE SEEN
Bilirubin Urine: NEGATIVE
Glucose, UA: NEGATIVE mg/dL
Hgb urine dipstick: NEGATIVE
Ketones, ur: 20 mg/dL — AB
Leukocytes,Ua: NEGATIVE
Nitrite: NEGATIVE
Protein, ur: 30 mg/dL — AB
Specific Gravity, Urine: 1.024 (ref 1.005–1.030)
pH: 6 (ref 5.0–8.0)

## 2022-11-12 LAB — COMPREHENSIVE METABOLIC PANEL
ALT: 18 U/L (ref 0–44)
AST: 20 U/L (ref 15–41)
Albumin: 4.5 g/dL (ref 3.5–5.0)
Alkaline Phosphatase: 70 U/L (ref 38–126)
Anion gap: 10 (ref 5–15)
BUN: 14 mg/dL (ref 6–20)
CO2: 26 mmol/L (ref 22–32)
Calcium: 9.6 mg/dL (ref 8.9–10.3)
Chloride: 101 mmol/L (ref 98–111)
Creatinine, Ser: 0.97 mg/dL (ref 0.61–1.24)
GFR, Estimated: >60 mL/min (ref >60)
Glucose, Bld: 89 mg/dL (ref 70–99)
Potassium: 3.6 mmol/L (ref 3.5–5.1)
Sodium: 137 mmol/L (ref 135–145)
Total Bilirubin: 0.9 mg/dL (ref 0.3–1.2)
Total Protein: 8.6 g/dL — ABNORMAL HIGH (ref 6.5–8.1)

## 2022-11-12 LAB — CBC
HCT: 43.5 % (ref 39.0–52.0)
Hemoglobin: 14.5 g/dL (ref 13.0–17.0)
MCH: 30.7 pg (ref 26.0–34.0)
MCHC: 33.3 g/dL (ref 30.0–36.0)
MCV: 92 fL (ref 80.0–100.0)
Platelets: 377 10*3/uL (ref 150–400)
RBC: 4.73 MIL/uL (ref 4.22–5.81)
RDW: 13.3 % (ref 11.5–15.5)
WBC: 8.9 10*3/uL (ref 4.0–10.5)
nRBC: 0 % (ref 0.0–0.2)

## 2022-11-12 LAB — LIPASE, BLOOD: Lipase: 23 U/L (ref 11–51)

## 2022-11-12 MED ORDER — IOHEXOL 300 MG/ML  SOLN
100.0000 mL | Freq: Once | INTRAMUSCULAR | Status: AC | PRN
Start: 2022-11-12 — End: 2022-11-12
  Administered 2022-11-12: 100 mL via INTRAVENOUS

## 2022-11-12 MED ORDER — OXYCODONE-ACETAMINOPHEN 5-325 MG PO TABS
1.0000 | ORAL_TABLET | Freq: Once | ORAL | Status: AC
  Administered 2022-11-12: 1 via ORAL
  Filled 2022-11-12: qty 1

## 2022-11-12 MED ORDER — ONDANSETRON 4 MG PO TBDP
4.0000 mg | ORAL_TABLET | Freq: Once | ORAL | Status: AC
  Administered 2022-11-12: 4 mg via ORAL
  Filled 2022-11-12: qty 1

## 2022-11-12 MED ORDER — HYDROMORPHONE HCL 1 MG/ML IJ SOLN
0.5000 mg | Freq: Once | INTRAMUSCULAR | Status: AC
  Administered 2022-11-12: 0.5 mg via INTRAVENOUS
  Filled 2022-11-12: qty 0.5

## 2022-11-12 NOTE — ED Triage Notes (Signed)
Pt states he thinks he is having a flair up of diverticulitis. He is scheduled for surgery Friday to remove part of his intestines. Pt states he is having back pain and lower left abd pain which is the normal pain that comes with his diverticulitis.

## 2022-11-12 NOTE — ED Provider Triage Note (Signed)
Emergency Medicine Provider Triage Evaluation Note  Thomas Valdez , a 27 y.o. male  was evaluated in triage.  Pt complains of LLQ and RLQ pain that started last evening and will get an episode of vomiting in ED.  He has several instances of diverticulitis in the past.  He has surgery on 11/19/2022  Review of Systems  Positive: Lower abd pain, N/V Negative: Fevers, diarrhea  Physical Exam  BP (!) 157/89 (BP Location: Right Arm)   Pulse (!) 118   Temp 99.8 F (37.7 C) (Oral)   Resp 16   Ht 5\' 11"  (1.803 m)   Wt 74.4 kg   SpO2 98%   BMI 22.87 kg/m  Gen:   Awake, no distress   Resp:  Normal effort  MSK:   Moves extremities without difficulty  Other:    Medical Decision Making  Medically screening exam initiated at 5:45 PM.  Appropriate orders placed.  Thomas Valdez was informed that the remainder of the evaluation will be completed by another provider, this initial triage assessment does not replace that evaluation, and the importance of remaining in the ED until their evaluation is complete.  Labs, antiemetic, pain meds, and ct ordered   Judithann Sheen, [place] 11/12/22 1746

## 2022-11-13 MED ORDER — AMOXICILLIN-POT CLAVULANATE 875-125 MG PO TABS: 1.0000 | ORAL_TABLET | Freq: Two times a day (BID) | ORAL | 0 refills | Status: DC

## 2022-11-13 MED ORDER — ONDANSETRON HCL 4 MG PO TABS: 4.0000 mg | ORAL_TABLET | Freq: Four times a day (QID) | ORAL | 0 refills | Status: DC

## 2022-11-13 MED ORDER — OXYCODONE-ACETAMINOPHEN 5-325 MG PO TABS: 1.0000 | ORAL_TABLET | Freq: Four times a day (QID) | ORAL | 0 refills | Status: AC | PRN

## 2022-11-13 NOTE — Discharge Instructions (Addendum)
Take Tylenol for pain.  We are prescribing you a Percocet for breakthrough pain.  Please take them only as needed for severe pain.  You are being prescribed antibiotic.  Please take them for the full course.  Please follow-up with your surgeon as planned.  Return immediately if develop fevers, chills, worsening pain, inability to eat or drink due to nausea and vomiting or you develop any new or worsening symptoms that are concerning to you.

## 2022-11-13 NOTE — ED Provider Notes (Signed)
[place] EMERGENCY DEPARTMENT AT [facility] Provider Note   CSN: [id number] Arrival date & time: 11/12/22  1631     History  No chief complaint on file.   Thomas Valdez is a 27 y.o. male.  27 year old male to the emergency department with left lower abdominal pain.  History of diverticulitis.  Reports symptoms feel similar.  Had some nausea and vomiting earlier today, but none since been in the emergency department.  Reportedly has surgery planned 11/8 for his recurrent diverticulitis.  He received pain medications in triage and prior to my evaluation.  Reports improvement of pain.  He is tolerating p.o.        Home Medications Prior to Admission medications   Medication Sig Start Date End Date Taking? Authorizing Provider  acetaminophen (TYLENOL) 500 MG tablet Take 2 tablets (1,000 mg total) by mouth every 6 (six) hours. Patient taking differently: Take 1,000 mg by mouth every 6 (six) hours as needed for moderate pain (pain score 4-6). 08/13/21   Gillis Santa, MD  metroNIDAZOLE (FLAGYL) 500 MG tablet Take 1 tablet (500 mg total) by mouth as directed. Take 2 metronidazole 500mg  tablets at 2 pm, 3pm, and 10pm. 11/02/22   [name], Santina Evans A, DO  neomycin (MYCIFRADIN) 500 MG tablet Take 2 tablets (1,000 mg total) by mouth as directed. Take 2 neomycin 500mg  tablets at 2 pm, 3pm, and 10pm. 11/02/22   [name], Santina Evans A, DO  valACYclovir (VALTREX) 500 MG tablet Take 500 mg by mouth 2 (two) times daily as needed (fever blisters).    [provider]      Allergies    Patient has no known allergies.    Review of Systems   Review of Systems  Physical Exam Updated Vital Signs BP 109/79 (BP Location: Right Arm)   Pulse 70   Temp 98.3 F (36.8 C) (Oral)   Resp 16   Ht 5\' 11"  (1.803 m)   Wt 74.4 kg   SpO2 96%   BMI 22.87 kg/m  Physical Exam Vitals and nursing note reviewed.  HENT:     Head: Normocephalic.     Nose: Nose normal.      Mouth/Throat:     Mouth: Mucous membranes are moist.  Eyes:     Conjunctiva/sclera: Conjunctivae normal.  Cardiovascular:     Rate and Rhythm: Normal rate.  Pulmonary:     Effort: Pulmonary effort is normal.  Abdominal:     General: Abdomen is flat.     Palpations: Abdomen is soft.     Tenderness: There is abdominal tenderness (mild llq).  Musculoskeletal:        General: Normal range of motion.  Skin:    General: Skin is warm.     Capillary Refill: Capillary refill takes less than 2 seconds.  Neurological:     Mental Status: He is alert and oriented to person, place, and time.  Psychiatric:        Mood and Affect: Mood normal.        Behavior: Behavior normal.     ED Results / Procedures / Treatments   Labs (all labs ordered are listed, but only abnormal results are displayed) Labs Reviewed  COMPREHENSIVE METABOLIC PANEL - Abnormal; Notable for the following components:      Result Value   Total Protein 8.6 (*)    All other components within normal limits  URINALYSIS, ROUTINE W REFLEX MICROSCOPIC - Abnormal; Notable for the following components:   Ketones, ur 20 (*)  Protein, ur 30 (*)    All other components within normal limits  LIPASE, BLOOD  CBC    EKG None  Radiology CT ABDOMEN PELVIS W CONTRAST  Result Date: 11/12/2022 CLINICAL DATA:  Left lower quadrant abdominal pain, diverticulitis EXAM: CT ABDOMEN AND PELVIS WITH CONTRAST TECHNIQUE: Multidetector CT imaging of the abdomen and pelvis was performed using the standard protocol following bolus administration of intravenous contrast. RADIATION DOSE REDUCTION: This exam was performed according to the departmental dose-optimization program which includes automated exposure control, adjustment of the mA and/or kV according to patient size and/or use of iterative reconstruction technique. CONTRAST:  OMNIPAQUE IOHEXOL 300 MG/ML  SOLN COMPARISON:  None Available. FINDINGS: Lower chest: No acute abnormality.  Hepatobiliary: No focal liver abnormality is seen. No gallstones, gallbladder wall thickening, or biliary dilatation. Pancreas: Unremarkable Spleen: Unremarkable Adrenals/Urinary Tract: Adrenal glands are unremarkable. Kidneys are normal, without renal calculi, focal lesion, or hydronephrosis. Bladder is unremarkable. Stomach/Bowel: There is a circumferential marked bowel wall thickening and pericolonic inflammatory stranding involving the sigmoid colon within its proximal and mid segment, similar prior examination. There is background moderate sigmoid diverticulosis. These findings may reflect changes related to recurrent diverticulitis. An infectious or inflammatory colitis, however, could appear similarly given the relatively long extent of disease. There is shotty adenopathy involving the inferior mesenteric nodal chain, likely reactive in nature. No loculated pericolonic fluid collections are identified. No evidence of obstruction. The appendix is normal. No free intraperitoneal gas or fluid. Vascular/Lymphatic: The abdominal vasculature is unremarkable. No pathologic adenopathy within the abdomen and pelvis. Reproductive: Prostate is unremarkable. Other: No abdominal wall hernia. Musculoskeletal: No acute or significant osseous findings. IMPRESSION: 1. Marked circumferential bowel wall thickening and pericolonic inflammatory stranding involving the sigmoid colon within its proximal and mid segment, similar prior examination. There is background moderate sigmoid diverticulosis. These findings may reflect changes related to recurrent diverticulitis. An infectious or inflammatory colitis, however, could appear similarly. No loculated pericolonic fluid collections are identified. No evidence of obstruction. Electronically Signed   By: Helyn Numbers M.D.   On: 11/12/2022 21:41    Procedures Procedures    Medications Ordered in ED Medications  ondansetron (ZOFRAN-ODT) disintegrating tablet 4 mg (4 mg Oral  Given 11/12/22 1948)  HYDROmorphone (DILAUDID) injection 0.5 mg (0.5 mg Intravenous Given 11/12/22 1949)  iohexol (OMNIPAQUE) 300 MG/ML solution 100 mL (100 mLs Intravenous Contrast Given 11/12/22 2017)  oxyCODONE-acetaminophen (PERCOCET/ROXICET) 5-325 MG per tablet 1 tablet (1 tablet Oral Given 11/12/22 2330)    ED Course/ Medical Decision Making/ A&P Clinical Course as of 11/13/22 0013  Fri Nov 12, 2022  2156 CT ABDOMEN PELVIS W CONTRAST [TY]  2317 CT ABDOMEN PELVIS W CONTRAST IMPRESSION: 1. Marked circumferential bowel wall thickening and pericolonic inflammatory stranding involving the sigmoid colon within its proximal and mid segment, similar prior examination. There is background moderate sigmoid diverticulosis. These findings may reflect changes related to recurrent diverticulitis. An infectious or inflammatory colitis, however, could appear similarly. No loculated pericolonic fluid collections are identified. No evidence of obstruction.   Electronically Signed   [TY]    Clinical Course User Index [TY] Coral Spikes, DO                                 Medical Decision Making This is a 27 year old male presenting emergency department for left lower quadrant pain and history of recurrent diverticulitis.  Symptoms similar to prior episodes.  He is afebrile nontachycardic normotensive.  Physical exam reassuring with mild left lower quadrant tenderness, no rebound or guarding.  Per chart review had several episodes of uncomplicated diverticulitis.  Labs today with no leukocytosis to suggest systemic infection.  Noted metabolic derangements to suggest dehydration, normal kidney function.  No transaminitis to suggest hepatobiliary disease.  CT scan with acute diverticulitis; appears uncomplicated.  Shared decision making regarding trial of pain medications, antibiotics and nausea meds at home.  Patient would like to try to manage at home.  Will return immediately if symptoms worsen.  He  does have follow-up with general surgery on the eighth.  Stable for discharge at this time.  Amount and/or Complexity of Data Reviewed Labs: ordered. Radiology:  Decision-making details documented in ED Course.  Risk Prescription drug management.         Final Clinical Impression(s) / ED Diagnoses Final diagnoses:  None    Rx / DC Orders ED Discharge Orders     None         Coral Spikes, DO 11/13/22 0013

## 2022-11-14 IMAGING — CT CT ABD-PELV W/ CM
2 of 4 series · 16 of 46 positions shown, 18 images · IV contrast (Omnipaque or Isovue)
Comparison: 08/17/2018

CLINICAL DATA: Right lower quadrant abdominal pain

EXAM:
CT ABDOMEN AND PELVIS WITH CONTRAST
TECHNIQUE: Multidetector CT imaging of the abdomen and pelvis was performed
using the standard protocol following bolus administration of
intravenous contrast.
CONTRAST:  100mL OMNIPAQUE IOHEXOL 300 MG/ML  SOLN

[Series 2: axial st · axial · 0.66mm/px · z∈[-630,-164]mm · 13 of 101 slices shown, 15 images]
[im 4/101  soft-tissue]
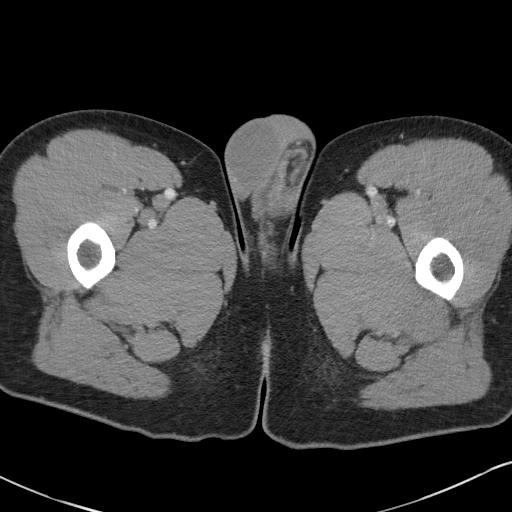
[im 4/101  bone]
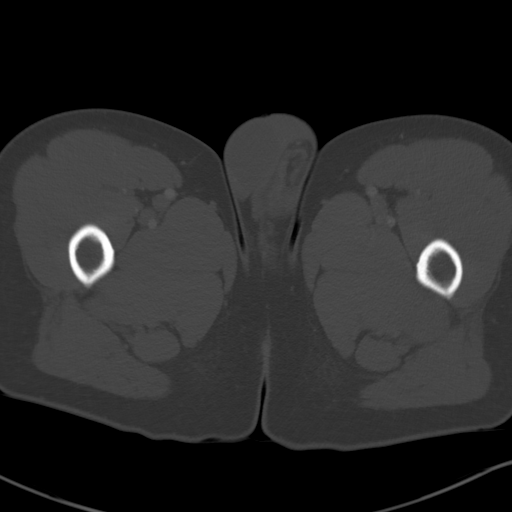
[im 12/101  soft-tissue]
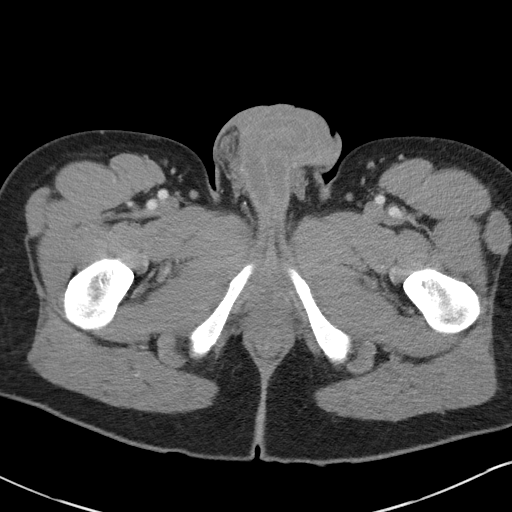
[im 20/101  soft-tissue]
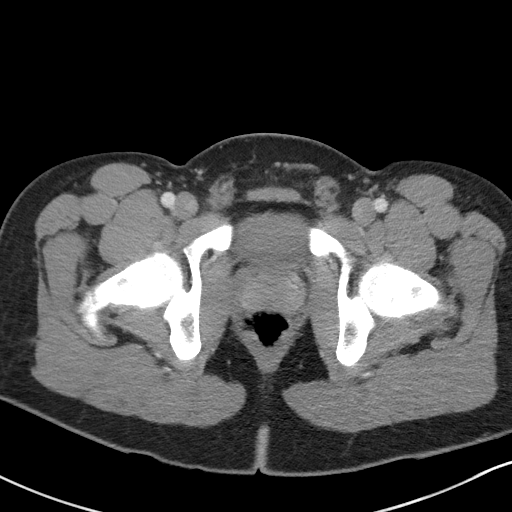
[im 27/101  soft-tissue]
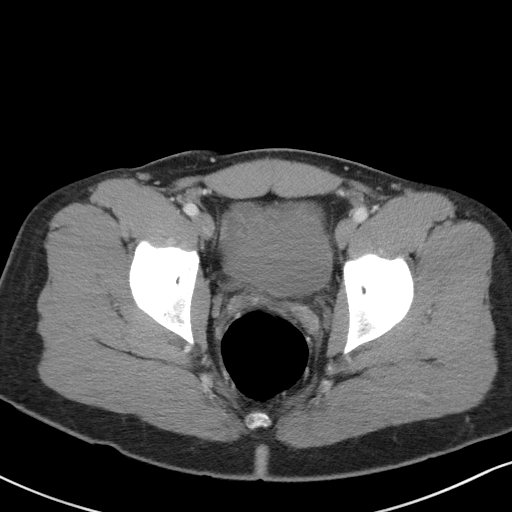
[im 35/101  soft-tissue]
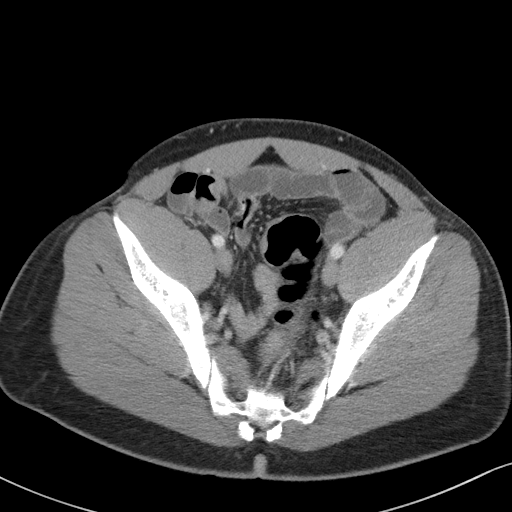
[im 43/101  soft-tissue]
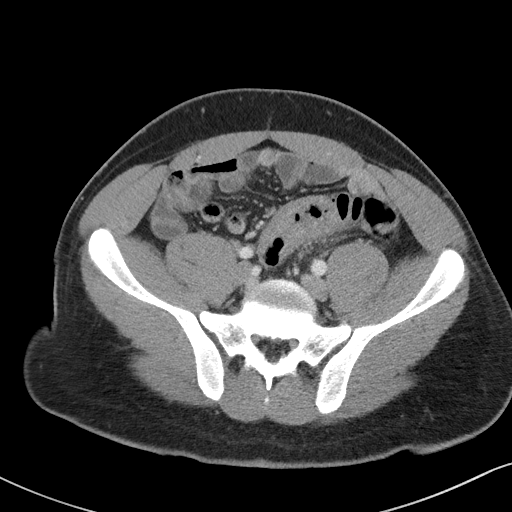
[im 51/101  soft-tissue]
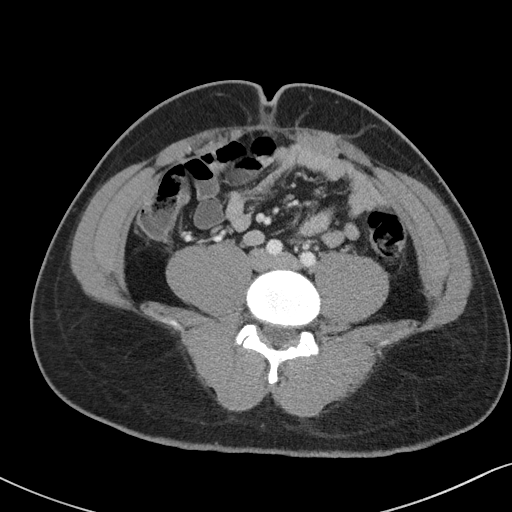
[im 58/101  soft-tissue]
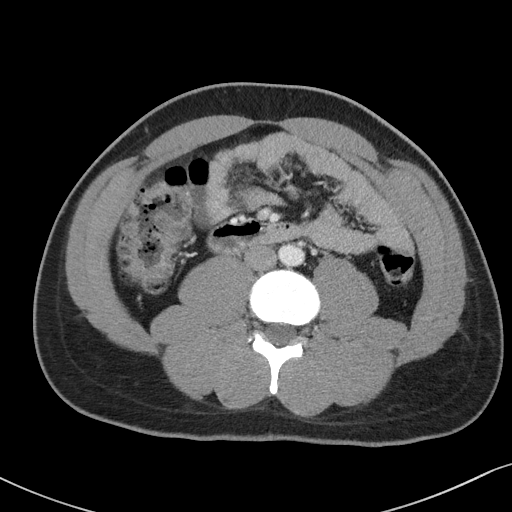
[im 66/101  soft-tissue]
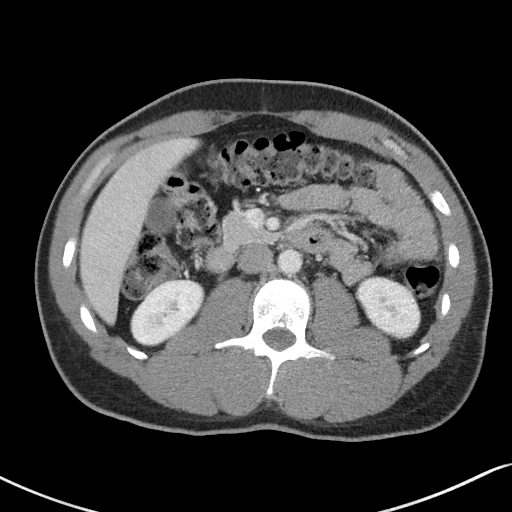
[im 66/101  bone]
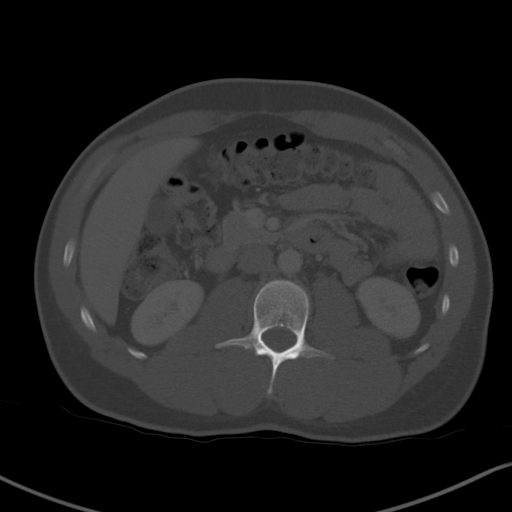
[im 74/101  soft-tissue]
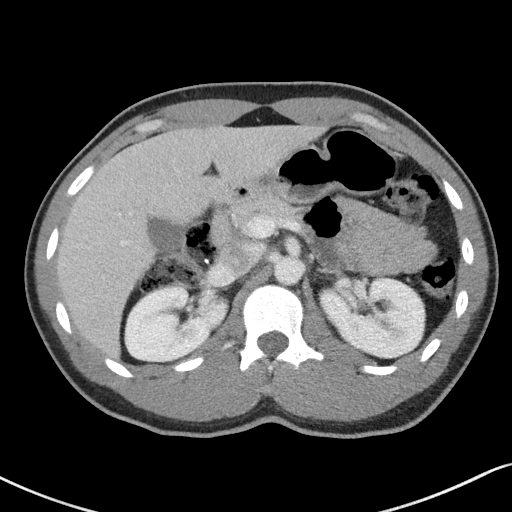
[im 81/101  soft-tissue]
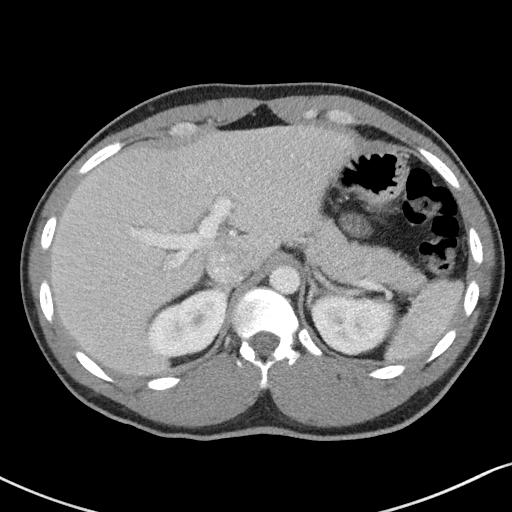
[im 89/101  soft-tissue]
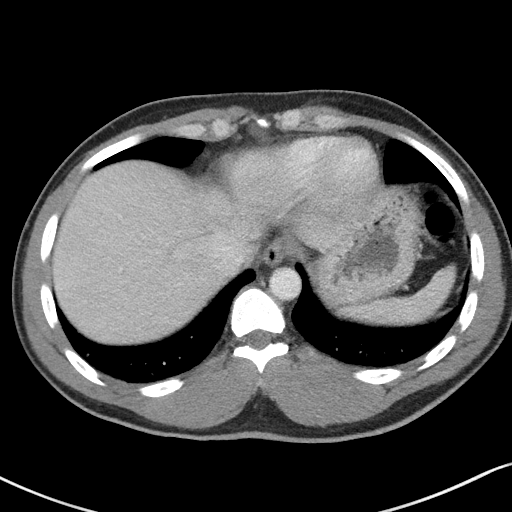
[im 97/101  soft-tissue]
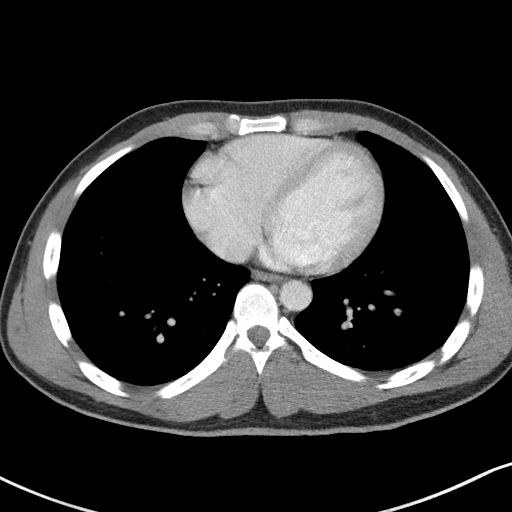

[Series 5: coronal st · coronal · 0.68mm/px · 3 of 98 slices shown]
[im 33/98  soft-tissue]
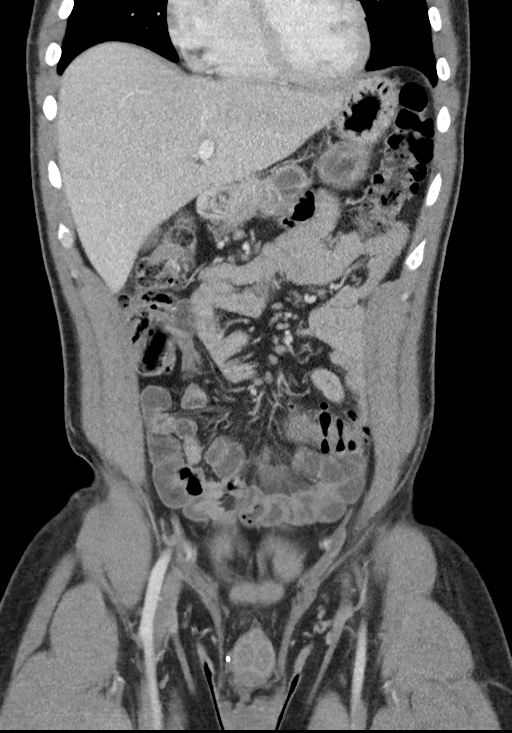
[im 44/98  soft-tissue]
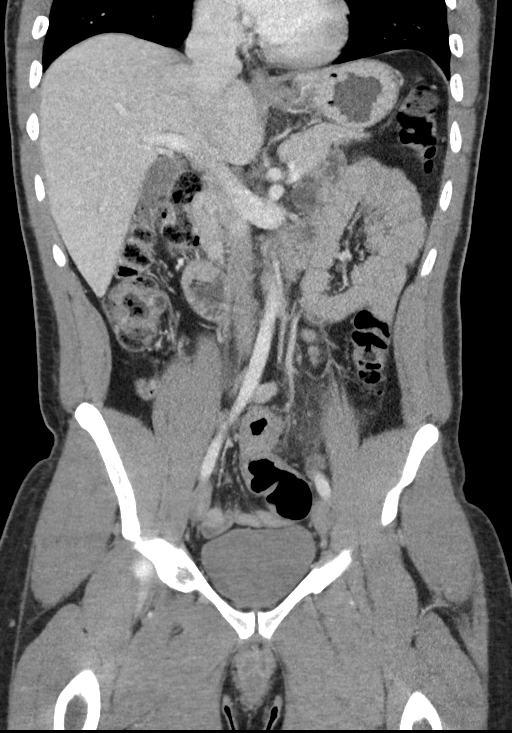
[im 54/98  soft-tissue]
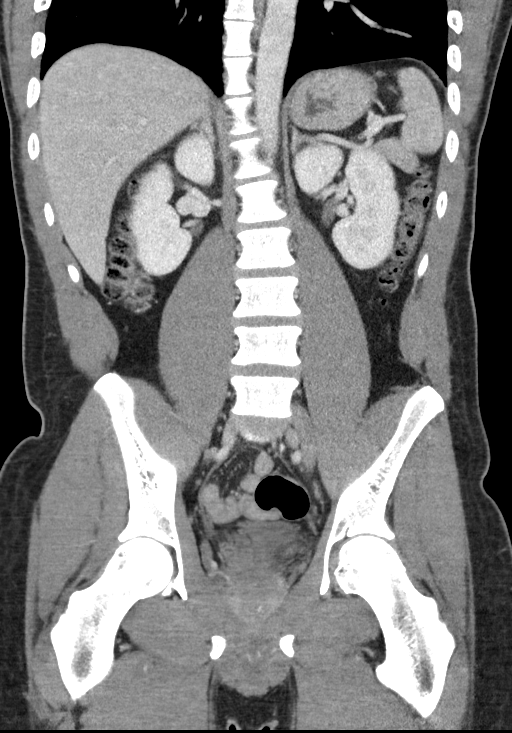

[16 of 46 positions shown; findings below may reference images not displayed]

FINDINGS: Lower chest: Lung bases are clear.

Hepatobiliary: Liver is within normal limits.

Gallbladder is unremarkable. No intrahepatic or extrahepatic ductal
dilatation.

Pancreas: Within normal limits.

Spleen: Within normal limits.

Adrenals/Urinary Tract: Adrenal glands are within normal limits.

Kidneys are within normal limits.  No hydronephrosis.

Bladder is within normal limits.

Stomach/Bowel: Stomach is within normal limits.

No evidence of bowel obstruction.

Normal appendix (series 2/image 55).

Segmental wall thickening (series 2/image 60) with mild pericolonic
inflammatory changes (series 2/image 64) involving the proximal
sigmoid colon, favoring sigmoid colitis or diverticulitis.

Vascular/Lymphatic: No evidence of abdominal aortic aneurysm.

No suspicious abdominopelvic lymphadenopathy.

Reproductive: Prostate is unremarkable.

Other: No abdominopelvic ascites.

Musculoskeletal: Visualized osseous structures are within normal
limits.
IMPRESSION: Segmental wall thickening with mild pericolonic inflammatory changes
involving the proximal sigmoid colon, favoring sigmoid colitis or
diverticulitis.

No evidence of bowel obstruction.  Normal appendix.

## 2022-11-16 NOTE — Patient Instructions (Signed)
Thomas Valdez  11/16/2022     @PREFPERIOPPHARMACY @   Your procedure is scheduled on  11/19/2022.   Report to [facility] at  0600  A.M.   Call this number if you have problems the morning of surgery:  [phone]  If you experience any cold or flu symptoms such as cough, fever, chills, shortness of breath, etc. between now and your scheduled surgery, please notify us at the above number.   Remember:  Follow the diet and prep instructions given to you by the office.       Drink 2 carb drinks before bed on 11/18/2022.   You may drink clear liquids until 0330 am on 11/19/2022.    Clear liquids allowed are:                    Water, Carbonated beverages (diabetics please choose diet or no sugar options), Black Coffee Only (No creamer, milk or cream, including half & half and powdered creamer), and Clear Sports drink (No red color; diabetics please choose diet or no sugar options)       At 0330 am on 11/19/2022 drink 1 carb drink. You can have nothing else to drink after this.      Take these medicines the morning of surgery with A SIP OF WATER                      Zofran (if needed), oxycodone(if needed).     Do not wear jewelry, make-up or nail polish, including gel polish,  artificial nails, or any other type of covering on natural nails (fingers and  toes).  Do not wear lotions, powders, or perfumes, or deodorant.  Do not shave 48 hours prior to surgery.  Men may shave face and neck.  Do not bring valuables to the hospital.  [facility] is not responsible for any belongings or valuables.  Contacts, dentures or bridgework may not be worn into surgery.  Leave your suitcase in the car.  After surgery it may be brought to your room.  For patients admitted to the hospital, discharge time will be determined by your treatment team.  Patients discharged the day of surgery will not be allowed to drive home and must have someone with them for 24 hours.    Special  instructions:   DO NOT smoke tobacco or ape for 24 hours before your procedure.  Please read over the following fact sheets that you were given. Coughing and Deep Breathing, Blood Transfusion Information, Surgical Site Infection Prevention, Anesthesia Post-op Instructions, and Care and Recovery After Surgery      Minimally Invasive Partial Colectomy, Adult, Care After The following information offers guidance on how to care for yourself after your procedure. Your health care provider may also give you more specific instructions. If you have problems or questions, contact your health care provider. What can I expect after the surgery? After the procedure, it is common to have: Pain, bruising, and swelling. Bloating. Weakness and tiredness (fatigue). Changes to your bowel movements, especially having bowel movements more often. Follow these instructions at home: Medicines Take over-the-counter and prescription medicines only as told by your health care provider. If you were prescribed an antibiotic medicine, take it as told by your health care provider. Do not stop using the antibiotic even if you start to feel better. Ask your health care provider if the medicine prescribed to you: Requires you to avoid driving or  using machinery. Can cause constipation. You may need to take these actions to prevent or treat constipation: Drink enough fluids to keep your urine pale yellow. Take over-the-counter or prescription medicines. Limit foods that are high in fat and processed sugars, such as fried or sweet foods. Eating and drinking Follow instructions from your health care provider about what you may eat and drink. Do not drink alcohol if your health care provider tells you not to drink. Eat a low-fiber diet for the first 4 weeks after surgery or as told by your health care provider.. Most people on a low-fiber eating plan should eat less than 10 grams (g) of fiber a day. Follow recommendations  from your health care provider or dietitian about how much fiber you should have each day. Always check food labels to know the fiber content of packaged foods. In general, a low-fiber food will have fewer than 2 g of fiber per serving. In general, try to avoid whole grains, raw fruits and vegetables, dried fruit, tough cuts of meat, nuts, and seeds. Incision care  Follow instructions from your health care provider about how to take care of your incisions. Make sure you: Wash your hands with soap and water for at least 20 seconds before and after you change your bandage (dressing). If soap and water are not available, use hand sanitizer. Change your dressing as told by your health care provider. Leave stitches (sutures), skin glue, or adhesive strips in place. These skin closures may need to stay in place for 2 weeks or longer. If adhesive strip edges start to loosen and curl up, you may trim the loose edges. Do not remove adhesive strips completely unless your health care provider tells you to do that. Check your incision area every day for signs of infection. Check for: More redness, swelling, or pain. Fluid or blood. Warmth. Pus or a bad smell. Activity Rest as told by your health care provider. Avoid sitting for a long time without moving. Get up to take short walks every 1-2 hours. This is important to improve blood flow and breathing. Ask for help if you feel weak or unsteady. You may have to avoid lifting. Ask your health care provider how much you can safely lift. Return to your normal activities as told by your health care provider. Ask your health care provider what activities are safe for you. General instructions Do not use any products that contain nicotine or tobacco. These products include cigarettes, chewing tobacco, and vaping devices, such as e-cigarettes. If you need help quitting, ask your health care provider. Do not take baths, swim, or use a hot tub until your health care  provider approves. Ask your health care provider if you may take showers. You may only be allowed to take sponge baths. Wear compression stockings as told by your health care provider. These stockings help to prevent blood clots and reduce swelling in your legs. Keep all follow-up visits. This is important to monitor healing and check for any complications. Contact a health care provider if: Medicine is not controlling your pain. You have chills or fever. You have any signs of infection in your incision areas. You have a persistent cough. You have nausea or vomiting. You develop a rash. You have not had a bowel movement in 3 days. Get help right away if: You have severe pain. Your incisions break open after sutures or staples have been removed. You are bleeding from your rectum or have blood in your stool. You  have a warm, tender swelling in your leg. You have chest pain or trouble breathing. You have increased swelling in the abdomen. You feel light-headed or you faint. These symptoms may be an emergency. Get help right away. Call 911. Do not wait to see if the symptoms will go away. Do not drive yourself to the hospital. Summary After surgery, it is common to have some pain, bruising, swelling, bloating, tiredness, weakness, or changes to your bowel movements. Follow instructions from your health care provider about what to eat and drink. Return to your normal activities as told by your health care provider. Check your incision area every day for signs of infection. Get help right away if you have chest pain or trouble breathing. This information is not intended to replace advice given to you by your health care provider. Make sure you discuss any questions you have with your health care provider. Document Revised: 04/15/2021 Document Reviewed: 04/15/2021 Elsevier Patient Education  2024 Else[employer] Inc[employer]   General Anesthesia, Adult, Care After The following information offers  guidance on how to care for yourself after your procedure. Your health care provider may also give you more specific instructions. If you have problems or questions, contact your health care provider. What can I expect after the procedure? After the procedure, it is common for people to: Have pain or discomfort at the IV site. Have nausea or vomiting. Have a sore throat or hoarseness. Have trouble concentrating. Feel cold or chills. Feel weak, sleepy, or tired (fatigue). Have soreness and body aches. These can affect parts of the body that were not involved in surgery. Follow these instructions at home: For the time period you were told by your health care provider:  Rest. Do not participate in activities where you could fall or become injured. Do not drive or use machinery. Do not drink alcohol. Do not take sleeping pills or medicines that cause drowsiness. Do not make important decisions or sign legal documents. Do not take care of children on your own. General instructions Drink enough fluid to keep your urine pale yellow. If you have sleep apnea, surgery and certain medicines can increase your risk for breathing problems. Follow instructions from your health care provider about wearing your sleep device: Anytime you are sleeping, including during daytime naps. While taking prescription pain medicines, sleeping medicines, or medicines that make you drowsy. Return to your normal activities as told by your health care provider. Ask your health care provider what activities are safe for you. Take over-the-counter and prescription medicines only as told by your health care provider. Do not use any products that contain nicotine or tobacco. These products include cigarettes, chewing tobacco, and vaping devices, such as e-cigarettes. These can delay incision healing after surgery. If you need help quitting, ask your health care provider. Contact a health care provider if: You have nausea or  vomiting that does not get better with medicine. You vomit every time you eat or drink. You have pain that does not get better with medicine. You cannot urinate or have bloody urine. You develop a skin rash. You have a fever. Get help right away if: You have trouble breathing. You have chest pain. You vomit blood. These symptoms may be an emergency. Get help right away. Call 911. Do not wait to see if the symptoms will go away. Do not drive yourself to the hospital. Summary After the procedure, it is common to have a sore throat, hoarseness, nausea, vomiting, or to feel weak, sleepy,  or fatigue. For the time period you were told by your health care provider, do not drive or use machinery. Get help right away if you have difficulty breathing, have chest pain, or vomit blood. These symptoms may be an emergency. This information is not intended to replace advice given to you by your health care provider. Make sure you discuss any questions you have with your health care provider. Document Revised: 03/27/2021 Document Reviewed: 03/27/2021 Elsevier Patient Education  2024 Elsevier Inc. How to Use Chlorhexidine Before Surgery Chlorhexidine gluconate (CHG) is a germ-killing (antiseptic) solution that is used to clean the skin. It can get rid of the bacteria that normally live on the skin and can keep them away for about 24 hours. To clean your skin with CHG, you may be given: A CHG solution to use in the shower or as part of a sponge bath. A prepackaged cloth that contains CHG. Cleaning your skin with CHG may help lower the risk for infection: While you are staying in the intensive care unit of the hospital. If you have a vascular access, such as a central line, to provide short-term or long-term access to your veins. If you have a catheter to drain urine from your bladder. If you are on a ventilator. A ventilator is a machine that helps you breathe by moving air in and out of your  lungs. After surgery. What are the risks? Risks of using CHG include: A skin reaction. Hearing loss, if CHG gets in your ears and you have a perforated eardrum. Eye injury, if CHG gets in your eyes and is not rinsed out. The CHG product catching fire. Make sure that you avoid smoking and flames after applying CHG to your skin. Do not use CHG: If you have a chlorhexidine allergy or have previously reacted to chlorhexidine. On babies younger than 50 months of age. How to use CHG solution Use CHG only as told by your health care provider, and follow the instructions on the label. Use the full amount of CHG as directed. Usually, this is one bottle. During a shower Follow these steps when using CHG solution during a shower (unless your health care provider gives you different instructions): Start the shower. Use your normal soap and shampoo to wash your face and hair. Turn off the shower or move out of the shower stream. Pour the CHG onto a clean washcloth. Do not use any type of brush or rough-edged sponge. Starting at your neck, lather your body down to your toes. Make sure you follow these instructions: If you will be having surgery, pay special attention to the part of your body where you will be having surgery. Scrub this area for at least 1 minute. Do not use CHG on your head or face. If the solution gets into your ears or eyes, rinse them well with water. Avoid your genital area. Avoid any areas of skin that have broken skin, cuts, or scrapes. Scrub your back and under your arms. Make sure to wash skin folds. Let the lather sit on your skin for 1-2 minutes or as long as told by your health care provider. Thoroughly rinse your entire body in the shower. Make sure that all body creases and crevices are rinsed well. Dry off with a clean towel. Do not put any substances on your body afterward--such as powder, lotion, or perfume--unless you are told to do so by your health care provider.  Only use lotions that are recommended by the manufacturer. Put  on clean clothes or pajamas. If it is the night before your surgery, sleep in clean sheets.  During a sponge bath Follow these steps when using CHG solution during a sponge bath (unless your health care provider gives you different instructions): Use your normal soap and shampoo to wash your face and hair. Pour the CHG onto a clean washcloth. Starting at your neck, lather your body down to your toes. Make sure you follow these instructions: If you will be having surgery, pay special attention to the part of your body where you will be having surgery. Scrub this area for at least 1 minute. Do not use CHG on your head or face. If the solution gets into your ears or eyes, rinse them well with water. Avoid your genital area. Avoid any areas of skin that have broken skin, cuts, or scrapes. Scrub your back and under your arms. Make sure to wash skin folds. Let the lather sit on your skin for 1-2 minutes or as long as told by your health care provider. Using a different clean, wet washcloth, thoroughly rinse your entire body. Make sure that all body creases and crevices are rinsed well. Dry off with a clean towel. Do not put any substances on your body afterward--such as powder, lotion, or perfume--unless you are told to do so by your health care provider. Only use lotions that are recommended by the manufacturer. Put on clean clothes or pajamas. If it is the night before your surgery, sleep in clean sheets. How to use CHG prepackaged cloths Only use CHG cloths as told by your health care provider, and follow the instructions on the label. Use the CHG cloth on clean, dry skin. Do not use the CHG cloth on your head or face unless your health care provider tells you to. When washing with the CHG cloth: Avoid your genital area. Avoid any areas of skin that have broken skin, cuts, or scrapes. Before surgery Follow these steps when using a  CHG cloth to clean before surgery (unless your health care provider gives you different instructions): Using the CHG cloth, vigorously scrub the part of your body where you will be having surgery. Scrub using a back-and-forth motion for 3 minutes. The area on your body should be completely wet with CHG when you are done scrubbing. Do not rinse. Discard the cloth and let the area air-dry. Do not put any substances on the area afterward, such as powder, lotion, or perfume. Put on clean clothes or pajamas. If it is the night before your surgery, sleep in clean sheets.  For general bathing Follow these steps when using CHG cloths for general bathing (unless your health care provider gives you different instructions). Use a separate CHG cloth for each area of your body. Make sure you wash between any folds of skin and between your fingers and toes. Wash your body in the following order, switching to a new cloth after each step: The front of your neck, shoulders, and chest. Both of your arms, under your arms, and your hands. Your stomach and groin area, avoiding the genitals. Your right leg and foot. Your left leg and foot. The back of your neck, your back, and your buttocks. Do not rinse. Discard the cloth and let the area air-dry. Do not put any substances on your body afterward--such as powder, lotion, or perfume--unless you are told to do so by your health care provider. Only use lotions that are recommended by the manufacturer. Put on clean clothes  or pajamas. Contact a health care provider if: Your skin gets irritated after scrubbing. You have questions about using your solution or cloth. You swallow any chlorhexidine. Call your local poison control center (419-476-9652 in the [place] Get help right away if: Your eyes itch badly, or they become very red or swollen. Your skin itches badly and is red or swollen. Your hearing changes. You have trouble seeing. You have swelling or tingling in  your mouth or throat. You have trouble breathing. These symptoms may represent a serious problem that is an emergency. Do not wait to see if the symptoms will go away. Get medical help right away. Call your local emergency services (911 in the [place] Do not drive yourself to the hospital. Summary Chlorhexidine gluconate (CHG) is a germ-killing (antiseptic) solution that is used to clean the skin. Cleaning your skin with CHG may help to lower your risk for infection. You may be given CHG to use for bathing. It may be in a bottle or in a prepackaged cloth to use on your skin. Carefully follow your health care provider's instructions and the instructions on the product label. Do not use CHG if you have a chlorhexidine allergy. Contact your health care provider if your skin gets irritated after scrubbing. This information is not intended to replace advice given to you by your health care provider. Make sure you discuss any questions you have with your health care provider. Document Revised: 04/27/2021 Document Reviewed: 03/10/2020 Elsevier Patient Education  2023 [employer].

## 2022-11-17 ENCOUNTER — Encounter (HOSPITAL_COMMUNITY): Payer: Self-pay

## 2022-11-17 ENCOUNTER — Telehealth: Payer: Self-pay | Admitting: *Deleted

## 2022-11-17 ENCOUNTER — Encounter (HOSPITAL_COMMUNITY)
Admission: RE | Admit: 2022-11-17 | Discharge: 2022-11-17 | Disposition: A | Discharge status: Home / Self Care | Payer: Federal, State, Local not specified - PPO | Source: Ambulatory Visit | Attending: Surgery | Admitting: Surgery

## 2022-11-17 ENCOUNTER — Other Ambulatory Visit: Payer: Self-pay | Admitting: Surgery

## 2022-11-17 VITALS — BP 127/78 | HR 75 | Temp 98.3°F | Resp 16 | Ht 71.0 in | Wt 164.0 lb

## 2022-11-17 DIAGNOSIS — K5732 Diverticulitis of large intestine without perforation or abscess without bleeding: Secondary | ICD-10-CM | POA: Diagnosis not present

## 2022-11-17 DIAGNOSIS — Z01812 Encounter for preprocedural laboratory examination: Secondary | ICD-10-CM | POA: Insufficient documentation

## 2022-11-17 DIAGNOSIS — Z8249 Family history of ischemic heart disease and other diseases of the circulatory system: Secondary | ICD-10-CM | POA: Diagnosis not present

## 2022-11-17 DIAGNOSIS — K5792 Diverticulitis of intestine, part unspecified, without perforation or abscess without bleeding: Secondary | ICD-10-CM | POA: Insufficient documentation

## 2022-11-17 DIAGNOSIS — Z01818 Encounter for other preprocedural examination: Secondary | ICD-10-CM

## 2022-11-17 DIAGNOSIS — Z8719 Personal history of other diseases of the digestive system: Secondary | ICD-10-CM | POA: Insufficient documentation

## 2022-11-17 DIAGNOSIS — K660 Peritoneal adhesions (postprocedural) (postinfection): Secondary | ICD-10-CM | POA: Diagnosis not present

## 2022-11-17 LAB — TYPE AND SCREEN
ABO/RH(D): A POS
Antibody Screen: NEGATIVE

## 2022-11-17 LAB — HEMOGLOBIN A1C
Hgb A1c MFr Bld: 5.3 % (ref 4.8–5.6)
Mean Plasma Glucose: 105.41 mg/dL

## 2022-11-17 MED ORDER — NEOMYCIN SULFATE 500 MG PO TABS: 1000.0000 mg | ORAL_TABLET | ORAL | 0 refills | Status: DC

## 2022-11-17 NOTE — Telephone Encounter (Signed)
Received call from patient [phone]~ telephone.   Patient reports that he has misplaced Neomycin for bowel prep for upcoming procedure. Requested refill to [facility]   Prescription refilled to [facility] is Cheree Ditto. Call placed to patient to make aware. LMTRC. Also advised that insurance may not cover refill as he just picked up prescription.

## 2022-11-19 ENCOUNTER — Encounter (HOSPITAL_COMMUNITY)
Admission: RE | Disposition: A | Discharge status: Home / Self Care | Payer: Self-pay | Source: Home / Self Care | Attending: Surgery

## 2022-11-19 ENCOUNTER — Inpatient Hospital Stay (HOSPITAL_COMMUNITY): Payer: Federal, State, Local not specified - PPO | Admitting: Anesthesiology

## 2022-11-19 ENCOUNTER — Inpatient Hospital Stay (HOSPITAL_COMMUNITY)
Admission: RE | Admit: 2022-11-19 | Discharge: 2022-11-23 | DRG: 331 | Disposition: A | Discharge status: Home / Self Care | Payer: Federal, State, Local not specified - PPO | Attending: Surgery | Admitting: Surgery

## 2022-11-19 ENCOUNTER — Encounter (HOSPITAL_COMMUNITY): Payer: Self-pay | Admitting: Surgery

## 2022-11-19 DIAGNOSIS — K5732 Diverticulitis of large intestine without perforation or abscess without bleeding: Secondary | ICD-10-CM | POA: Diagnosis not present

## 2022-11-19 DIAGNOSIS — K5730 Diverticulosis of large intestine without perforation or abscess without bleeding: Secondary | ICD-10-CM | POA: Diagnosis not present

## 2022-11-19 DIAGNOSIS — Z8249 Family history of ischemic heart disease and other diseases of the circulatory system: Secondary | ICD-10-CM | POA: Diagnosis not present

## 2022-11-19 DIAGNOSIS — K660 Peritoneal adhesions (postprocedural) (postinfection): Secondary | ICD-10-CM | POA: Diagnosis present

## 2022-11-19 DIAGNOSIS — K5720 Diverticulitis of large intestine with perforation and abscess without bleeding: Secondary | ICD-10-CM | POA: Diagnosis not present

## 2022-11-19 DIAGNOSIS — Z8719 Personal history of other diseases of the digestive system: Principal | ICD-10-CM

## 2022-11-19 DIAGNOSIS — K529 Noninfective gastroenteritis and colitis, unspecified: Secondary | ICD-10-CM | POA: Diagnosis not present

## 2022-11-19 SURGERY — COLECTOMY, PARTIAL, ROBOT-ASSISTED, LAPAROSCOPIC
Anesthesia: General | Site: Abdomen

## 2022-11-19 MED ORDER — FENTANYL CITRATE (PF) 250 MCG/5ML IJ SOLN
INTRAMUSCULAR | Status: DC | PRN
  Administered 2022-11-19: 100 ug via INTRAVENOUS
  Administered 2022-11-19 (×7): 50 ug via INTRAVENOUS

## 2022-11-19 MED ORDER — LIDOCAINE HCL (PF) 2 % IJ SOLN
INTRAMUSCULAR | Status: AC
Start: 2022-11-19 — End: ?
  Filled 2022-11-19: qty 5

## 2022-11-19 MED ORDER — LACTATED RINGERS IV SOLN: INTRAVENOUS | Status: DC

## 2022-11-19 MED ORDER — HEMOSTATIC AGENTS (NO CHARGE) OPTIME
TOPICAL | Status: DC | PRN
  Administered 2022-11-19: 1 via TOPICAL

## 2022-11-19 MED ORDER — OXYCODONE HCL 5 MG PO TABS: 5.0000 mg | ORAL_TABLET | Freq: Once | ORAL | Status: DC | PRN

## 2022-11-19 MED ORDER — OXYCODONE HCL 5 MG/5ML PO SOLN: 5.0000 mg | Freq: Once | ORAL | Status: DC | PRN

## 2022-11-19 MED ORDER — OXYCODONE HCL 5 MG PO TABS
5.0000 mg | ORAL_TABLET | ORAL | Status: DC | PRN
Start: 2022-11-19 — End: 2022-11-23
  Administered 2022-11-19: 10 mg via ORAL
  Administered 2022-11-20: 5 mg via ORAL
  Administered 2022-11-20 – 2022-11-21 (×5): 10 mg via ORAL
  Administered 2022-11-22: 5 mg via ORAL
  Administered 2022-11-22: 10 mg via ORAL
  Administered 2022-11-22: 5 mg via ORAL
  Administered 2022-11-23: 10 mg via ORAL
  Filled 2022-11-19 (×4): qty 2
  Filled 2022-11-19: qty 1
  Filled 2022-11-19: qty 2
  Filled 2022-11-19: qty 1
  Filled 2022-11-19 (×5): qty 2

## 2022-11-19 MED ORDER — ALVIMOPAN 12 MG PO CAPS
12.0000 mg | ORAL_CAPSULE | ORAL | Status: AC
  Administered 2022-11-19: 12 mg via ORAL
  Filled 2022-11-19: qty 1

## 2022-11-19 MED ORDER — SUGAMMADEX SODIUM 200 MG/2ML IV SOLN
INTRAVENOUS | Status: DC | PRN
  Administered 2022-11-19: 200 mg via INTRAVENOUS

## 2022-11-19 MED ORDER — METOPROLOL TARTRATE 5 MG/5ML IV SOLN
INTRAVENOUS | Status: DC | PRN
  Administered 2022-11-19: 2 mg via INTRAVENOUS

## 2022-11-19 MED ORDER — GABAPENTIN 300 MG PO CAPS
300.0000 mg | ORAL_CAPSULE | Freq: Two times a day (BID) | ORAL | Status: DC
  Administered 2022-11-19 – 2022-11-23 (×8): 300 mg via ORAL
  Filled 2022-11-19 (×8): qty 1

## 2022-11-19 MED ORDER — MIDAZOLAM HCL 2 MG/2ML IJ SOLN
INTRAMUSCULAR | Status: DC | PRN
  Administered 2022-11-19: 2 mg via INTRAVENOUS

## 2022-11-19 MED ORDER — ENSURE PRE-SURGERY PO LIQD: 592.0000 mL | Freq: Once | ORAL | Status: DC

## 2022-11-19 MED ORDER — PROPOFOL 10 MG/ML IV BOLUS
INTRAVENOUS | Status: DC | PRN
  Administered 2022-11-19: 200 mg via INTRAVENOUS

## 2022-11-19 MED ORDER — ONDANSETRON HCL 4 MG PO TABS
4.0000 mg | ORAL_TABLET | Freq: Four times a day (QID) | ORAL | Status: DC | PRN
  Administered 2022-11-21: 4 mg via ORAL
  Filled 2022-11-19: qty 1

## 2022-11-19 MED ORDER — ENOXAPARIN SODIUM 40 MG/0.4ML IJ SOSY
40.0000 mg | PREFILLED_SYRINGE | INTRAMUSCULAR | Status: DC
Start: 2022-11-20 — End: 2022-11-23
  Administered 2022-11-20 – 2022-11-22 (×3): 40 mg via SUBCUTANEOUS
  Filled 2022-11-19 (×4): qty 0.4

## 2022-11-19 MED ORDER — FENTANYL CITRATE (PF) 100 MCG/2ML IJ SOLN
INTRAMUSCULAR | Status: AC
  Filled 2022-11-19: qty 2

## 2022-11-19 MED ORDER — SUCCINYLCHOLINE CHLORIDE 200 MG/10ML IV SOSY
PREFILLED_SYRINGE | INTRAVENOUS | Status: AC
Start: 2022-11-19 — End: ?
  Filled 2022-11-19: qty 10

## 2022-11-19 MED ORDER — ACETAMINOPHEN 500 MG PO TABS
1000.0000 mg | ORAL_TABLET | Freq: Four times a day (QID) | ORAL | Status: DC
  Administered 2022-11-19 – 2022-11-23 (×14): 1000 mg via ORAL
  Filled 2022-11-19 (×16): qty 2

## 2022-11-19 MED ORDER — ORAL CARE MOUTH RINSE: 15.0000 mL | Freq: Once | OROMUCOSAL | Status: DC

## 2022-11-19 MED ORDER — ROCURONIUM BROMIDE 10 MG/ML (PF) SYRINGE
PREFILLED_SYRINGE | INTRAVENOUS | Status: AC
Start: 2022-11-19 — End: ?
  Filled 2022-11-19: qty 10

## 2022-11-19 MED ORDER — DEXAMETHASONE SODIUM PHOSPHATE 10 MG/ML IJ SOLN
INTRAMUSCULAR | Status: DC | PRN
  Administered 2022-11-19: 10 mg via INTRAVENOUS

## 2022-11-19 MED ORDER — ENSURE SURGERY PO LIQD
237.0000 mL | Freq: Two times a day (BID) | ORAL | Status: DC
  Administered 2022-11-20: 237 mL via ORAL
  Filled 2022-11-19 (×10): qty 237

## 2022-11-19 MED ORDER — ONDANSETRON HCL 4 MG/2ML IJ SOLN: 4.0000 mg | Freq: Once | INTRAMUSCULAR | Status: DC | PRN

## 2022-11-19 MED ORDER — LIDOCAINE 2% (20 MG/ML) 5 ML SYRINGE
INTRAMUSCULAR | Status: DC | PRN
  Administered 2022-11-19: 100 mg via INTRAVENOUS

## 2022-11-19 MED ORDER — BUPIVACAINE HCL (PF) 0.5 % IJ SOLN
INTRAMUSCULAR | Status: AC
  Filled 2022-11-19: qty 30

## 2022-11-19 MED ORDER — ALVIMOPAN 12 MG PO CAPS
12.0000 mg | ORAL_CAPSULE | Freq: Two times a day (BID) | ORAL | Status: DC
  Administered 2022-11-20 – 2022-11-23 (×7): 12 mg via ORAL
  Filled 2022-11-19 (×7): qty 1

## 2022-11-19 MED ORDER — DEXAMETHASONE SODIUM PHOSPHATE 10 MG/ML IJ SOLN
INTRAMUSCULAR | Status: AC
  Filled 2022-11-19: qty 1

## 2022-11-19 MED ORDER — FENTANYL CITRATE PF 50 MCG/ML IJ SOSY
25.0000 ug | PREFILLED_SYRINGE | INTRAMUSCULAR | Status: DC | PRN
  Administered 2022-11-19 (×2): 50 ug via INTRAVENOUS
  Filled 2022-11-19 (×2): qty 1

## 2022-11-19 MED ORDER — ESMOLOL HCL 100 MG/10ML IV SOLN
INTRAVENOUS | Status: DC | PRN
Start: 2022-11-19 — End: 2022-11-19
  Administered 2022-11-19: 20 mg via INTRAVENOUS
  Administered 2022-11-19: 15 mg via INTRAVENOUS
  Administered 2022-11-19: 20 mg via INTRAVENOUS

## 2022-11-19 MED ORDER — ENSURE PRE-SURGERY PO LIQD: 296.0000 mL | Freq: Once | ORAL | Status: DC

## 2022-11-19 MED ORDER — ACETAMINOPHEN 500 MG PO TABS
1000.0000 mg | ORAL_TABLET | ORAL | Status: AC
  Administered 2022-11-19: 1000 mg via ORAL
  Filled 2022-11-19: qty 2

## 2022-11-19 MED ORDER — PROPOFOL 10 MG/ML IV BOLUS
INTRAVENOUS | Status: AC
  Filled 2022-11-19: qty 20

## 2022-11-19 MED ORDER — GLYCOPYRROLATE PF 0.2 MG/ML IJ SOSY
PREFILLED_SYRINGE | INTRAMUSCULAR | Status: DC | PRN
  Administered 2022-11-19: .2 mg via INTRAVENOUS

## 2022-11-19 MED ORDER — ONDANSETRON HCL 4 MG/2ML IJ SOLN
4.0000 mg | Freq: Four times a day (QID) | INTRAMUSCULAR | Status: DC | PRN
  Administered 2022-11-19 – 2022-11-22 (×2): 4 mg via INTRAVENOUS
  Filled 2022-11-19 (×3): qty 2

## 2022-11-19 MED ORDER — CHLORHEXIDINE GLUCONATE CLOTH 2 % EX PADS: 6.0000 | MEDICATED_PAD | Freq: Once | CUTANEOUS | Status: DC

## 2022-11-19 MED ORDER — HYDROMORPHONE HCL 1 MG/ML IJ SOLN
0.5000 mg | INTRAMUSCULAR | Status: DC | PRN
  Administered 2022-11-19 – 2022-11-23 (×8): 0.5 mg via INTRAVENOUS
  Filled 2022-11-19 (×8): qty 0.5

## 2022-11-19 MED ORDER — GLYCOPYRROLATE PF 0.2 MG/ML IJ SOSY
PREFILLED_SYRINGE | INTRAMUSCULAR | Status: AC
Start: 2022-11-19 — End: ?
  Filled 2022-11-19: qty 1

## 2022-11-19 MED ORDER — METHOCARBAMOL 500 MG PO TABS
500.0000 mg | ORAL_TABLET | Freq: Three times a day (TID) | ORAL | Status: DC
  Administered 2022-11-19 – 2022-11-23 (×11): 500 mg via ORAL
  Filled 2022-11-19 (×11): qty 1

## 2022-11-19 MED ORDER — KETOROLAC TROMETHAMINE 15 MG/ML IJ SOLN
15.0000 mg | INTRAMUSCULAR | Status: AC
  Administered 2022-11-19: 15 mg via INTRAVENOUS
  Filled 2022-11-19: qty 1

## 2022-11-19 MED ORDER — SODIUM CHLORIDE 0.9 % IR SOLN
Status: DC | PRN
  Administered 2022-11-19: 3000 mL
  Administered 2022-11-19: 2000 mL

## 2022-11-19 MED ORDER — ENOXAPARIN SODIUM 40 MG/0.4ML IJ SOSY
40.0000 mg | PREFILLED_SYRINGE | Freq: Once | INTRAMUSCULAR | Status: AC
  Administered 2022-11-19: 40 mg via SUBCUTANEOUS
  Filled 2022-11-19: qty 0.4

## 2022-11-19 MED ORDER — STERILE WATER FOR IRRIGATION IR SOLN
Status: DC | PRN
  Administered 2022-11-19: 500 mL

## 2022-11-19 MED ORDER — SODIUM CHLORIDE 0.9 % IV SOLN
2.0000 g | Freq: Two times a day (BID) | INTRAVENOUS | Status: AC
  Administered 2022-11-19: 2 g via INTRAVENOUS
  Filled 2022-11-19: qty 2

## 2022-11-19 MED ORDER — SODIUM CHLORIDE 0.9 % IV SOLN: INTRAVENOUS | Status: AC

## 2022-11-19 MED ORDER — ROCURONIUM BROMIDE 10 MG/ML (PF) SYRINGE
PREFILLED_SYRINGE | INTRAVENOUS | Status: DC | PRN
  Administered 2022-11-19: 40 mg via INTRAVENOUS
  Administered 2022-11-19: 30 mg via INTRAVENOUS
  Administered 2022-11-19 (×2): 20 mg via INTRAVENOUS
  Administered 2022-11-19: 30 mg via INTRAVENOUS
  Administered 2022-11-19: 70 mg via INTRAVENOUS
  Administered 2022-11-19: 20 mg via INTRAVENOUS
  Administered 2022-11-19: 30 mg via INTRAVENOUS

## 2022-11-19 MED ORDER — BUPIVACAINE HCL (PF) 0.5 % IJ SOLN
INTRAMUSCULAR | Status: DC | PRN
  Administered 2022-11-19: 30 mL

## 2022-11-19 MED ORDER — SUCCINYLCHOLINE CHLORIDE 200 MG/10ML IV SOSY
PREFILLED_SYRINGE | INTRAVENOUS | Status: DC | PRN
  Administered 2022-11-19: 120 mg via INTRAVENOUS

## 2022-11-19 MED ORDER — ONDANSETRON HCL 4 MG/2ML IJ SOLN
INTRAMUSCULAR | Status: AC
Start: 2022-11-19 — End: ?
  Filled 2022-11-19: qty 2

## 2022-11-19 MED ORDER — SODIUM CHLORIDE 0.9 % IV SOLN
2.0000 g | INTRAVENOUS | Status: AC
  Administered 2022-11-19: 2 g via INTRAVENOUS
  Filled 2022-11-19: qty 2

## 2022-11-19 MED ORDER — CHLORHEXIDINE GLUCONATE 0.12 % MT SOLN: 15.0000 mL | Freq: Once | OROMUCOSAL | Status: DC

## 2022-11-19 MED ORDER — ONDANSETRON HCL 4 MG/2ML IJ SOLN
INTRAMUSCULAR | Status: DC | PRN
  Administered 2022-11-19: 4 mg via INTRAVENOUS

## 2022-11-19 MED ORDER — LACTATED RINGERS IV SOLN: INTRAVENOUS | Status: DC | PRN

## 2022-11-19 MED ORDER — ESMOLOL HCL 100 MG/10ML IV SOLN
INTRAVENOUS | Status: AC
  Filled 2022-11-19: qty 10

## 2022-11-19 MED ORDER — CELECOXIB 100 MG PO CAPS
200.0000 mg | ORAL_CAPSULE | Freq: Two times a day (BID) | ORAL | Status: DC
  Administered 2022-11-19 – 2022-11-23 (×8): 200 mg via ORAL
  Filled 2022-11-19 (×8): qty 2

## 2022-11-19 MED ORDER — FENTANYL CITRATE (PF) 250 MCG/5ML IJ SOLN
INTRAMUSCULAR | Status: AC
  Filled 2022-11-19: qty 5

## 2022-11-19 MED ORDER — MIDAZOLAM HCL 2 MG/2ML IJ SOLN
INTRAMUSCULAR | Status: AC
Start: 2022-11-19 — End: ?
  Filled 2022-11-19: qty 2

## 2022-11-19 SURGICAL SUPPLY — 83 items
APPLICATOR COTTON TIP 6 STRL (MISCELLANEOUS) IMPLANT
APPLICATOR COTTON TIP 6IN STRL (MISCELLANEOUS) ×1
CANNULA REDUCER 12-8 DVNC XI (CANNULA) ×1 IMPLANT
COVER LIGHT HANDLE (MISCELLANEOUS) IMPLANT
COVER TIP SHEARS 8 DVNC (MISCELLANEOUS) ×1 IMPLANT
DEFOGGER SCOPE WARMER CLEARIFY (MISCELLANEOUS) ×1 IMPLANT
DERMABOND ADVANCED .7 DNX12 (GAUZE/BANDAGES/DRESSINGS) ×1 IMPLANT
DRAPE ARM DVNC X/XI (DISPOSABLE) ×4 IMPLANT
DRAPE COLUMN DVNC XI (DISPOSABLE) ×1 IMPLANT
DRSG OPSITE POSTOP 4X10 (GAUZE/BANDAGES/DRESSINGS) IMPLANT
DRSG OPSITE POSTOP 4X8 (GAUZE/BANDAGES/DRESSINGS) IMPLANT
DRSG TEGADERM 2-3/8X2-3/4 SM (GAUZE/BANDAGES/DRESSINGS) ×4 IMPLANT
ELECT REM PT RETURN 9FT ADLT (ELECTROSURGICAL) ×1
ELECTRODE REM PT RTRN 9FT ADLT (ELECTROSURGICAL) ×1 IMPLANT
FORCEPS BPLR 8 MD DVNC XI (FORCEP) ×1 IMPLANT
GAUZE 4X4 16PLY ~~LOC~~+RFID DBL (SPONGE) ×1 IMPLANT
GAUZE SPONGE 2X2 STRL 8-PLY (GAUZE/BANDAGES/DRESSINGS) ×4 IMPLANT
GLOVE BIO SURGEON STRL SZ 6.5 (GLOVE) IMPLANT
GLOVE BIO SURGEON STRL SZ7 (GLOVE) IMPLANT
GLOVE BIOGEL PI IND STRL 6.5 (GLOVE) ×3 IMPLANT
GLOVE BIOGEL PI IND STRL 7.0 (GLOVE) ×5 IMPLANT
GLOVE ECLIPSE 6.5 STRL STRAW (GLOVE) IMPLANT
GLOVE SURG SS PI 6.5 STRL IVOR (GLOVE) ×3 IMPLANT
GOWN STRL REUS W/TWL LRG LVL3 (GOWN DISPOSABLE) ×7 IMPLANT
GRASPER TIP-UP FEN DVNC XI (INSTRUMENTS) ×1 IMPLANT
HEMOSTAT SNOW SURGICEL 2X4 (HEMOSTASIS) IMPLANT
IRRIGATOR SUCT 8 DISP DVNC XI (IRRIGATION / IRRIGATOR) IMPLANT
KIT PINK PAD W/HEAD ARE REST (MISCELLANEOUS) ×1
KIT PINK PAD W/HEAD ARM REST (MISCELLANEOUS) ×1 IMPLANT
LIGASURE IMPACT 36 18CM CVD LR (INSTRUMENTS) ×1 IMPLANT
MANIFOLD NEPTUNE II (INSTRUMENTS) ×1 IMPLANT
NDL HYPO 21X1.5 SAFETY (NEEDLE) ×1 IMPLANT
NDL INSUFFLATION 14GA 120MM (NEEDLE) ×1 IMPLANT
NEEDLE HYPO 21X1.5 SAFETY (NEEDLE) ×1
NEEDLE INSUFFLATION 14GA 120MM (NEEDLE) ×1
NS IRRIG 1000ML POUR BTL (IV SOLUTION) IMPLANT
OBTURATOR OPTICAL STND 8 DVNC (TROCAR) ×1
OBTURATOR OPTICALSTD 8 DVNC (TROCAR) ×1 IMPLANT
PACK COLON (CUSTOM PROCEDURE TRAY) ×1 IMPLANT
PENCIL HANDSWITCHING (ELECTRODE) ×1 IMPLANT
RELOAD LINEAR CUT PROX 55 BLUE (ENDOMECHANICALS)
RELOAD PROXIMATE 75MM BLUE (ENDOMECHANICALS)
RELOAD STAPLE 55 3.8 BLU REG (ENDOMECHANICALS) IMPLANT
RELOAD STAPLE 60 2.5 WHT DVNC (STAPLE) ×1 IMPLANT
RELOAD STAPLE 60 3.5 BLU DVNC (STAPLE) IMPLANT
RELOAD STAPLE 75 3.8 BLU REG (ENDOMECHANICALS) ×1 IMPLANT
RETRACTOR WND ALEXIS-O 25 LRG (MISCELLANEOUS) IMPLANT
RETRACTOR WOUND ALXS 18CM MED (MISCELLANEOUS) IMPLANT
RTRCTR WOUND ALEXIS O 18CM MED (MISCELLANEOUS) ×1
RTRCTR WOUND ALEXIS O 25CM LRG (MISCELLANEOUS) ×1
SCISSORS MNPLR CVD DVNC XI (INSTRUMENTS) ×1 IMPLANT
SEAL UNIV 5-12 XI (MISCELLANEOUS) ×4 IMPLANT
SEALER VESSEL EXT DVNC XI (MISCELLANEOUS) IMPLANT
SET TUBE SMOKE EVAC HIGH FLOW (TUBING) ×1 IMPLANT
SHEET LAVH (DRAPES) IMPLANT
SLEEVE ENDOPATH XCEL 5M (ENDOMECHANICALS) ×1 IMPLANT
SOL ANTI FOG 6CC (MISCELLANEOUS) ×1 IMPLANT
SPONGE T-LAP 18X18 ~~LOC~~+RFID (SPONGE) ×1 IMPLANT
STAPLER 60 SUREFORM DVNC (STAPLE) ×1 IMPLANT
STAPLER CIRCULAR MANUAL XL 29 (STAPLE) IMPLANT
STAPLER GUN LINEAR PROX 60 (STAPLE) ×1 IMPLANT
STAPLER PROXIMATE 55 BLUE (STAPLE) IMPLANT
STAPLER PROXIMATE 75MM BLUE (STAPLE) ×1 IMPLANT
STAPLER RELOAD 2.5X60 WHT DVNC (STAPLE)
STAPLER RELOAD 3.5X60 BLU DVNC (STAPLE) ×4
STAPLER RELOADABLE 65 2-0 SUT (MISCELLANEOUS) IMPLANT
STAPLER SYS INTERNAL RELOAD SS (MISCELLANEOUS) ×1
STAPLER VISISTAT (STAPLE) ×1 IMPLANT
SUT DVC VLOC 3-0 CL 6 P-12 (SUTURE) ×1 IMPLANT
SUT MNCRL AB 4-0 PS2 18 (SUTURE) ×1 IMPLANT
SUT PDS AB 0 CT1 36 (SUTURE) ×2 IMPLANT
SUT SILK 2 0 SH (SUTURE) ×2 IMPLANT
SUT SILK 3 0 SH CR/8 (SUTURE) ×1 IMPLANT
SUT V-LOC 90 ABS 3-0 VLT V-20 (SUTURE) IMPLANT
SUT VICRYL 0 UR6 27IN ABS (SUTURE) IMPLANT
SUT VLOC 90 S/L VL9 GS22 (SUTURE) ×2 IMPLANT
SYR 30ML LL (SYRINGE) ×1 IMPLANT
TOWEL OR 17X26 4PK STRL BLUE (TOWEL DISPOSABLE) IMPLANT
TOWEL ~~LOC~~+RFID 17X26 BLUE (SPONGE) IMPLANT
TRAY FOLEY MTR SLVR 16FR STAT (SET/KITS/TRAYS/PACK) ×1 IMPLANT
TROCAR Z-THREAD FIOS 5X100MM (TROCAR) IMPLANT
WATER STERILE IRR 500ML POUR (IV SOLUTION) ×1 IMPLANT
YANKAUER SUCT BULB TIP 10FT TU (MISCELLANEOUS) IMPLANT

## 2022-11-19 NOTE — OR Nursing (Signed)
OR RN, Burnis Medin attempted to call pt's mother, Rinaldo Cloud, by phone [phone]). No answer.

## 2022-11-19 NOTE — Anesthesia Preprocedure Evaluation (Signed)
Anesthesia Evaluation  Patient identified by MRN, date of birth, ID band Patient awake    Reviewed: Allergy & Precautions, H&P , NPO status , Patient's Chart, lab work & pertinent test results, reviewed documented beta blocker date and time   Airway Mallampati: II  TM Distance: >3 FB Neck ROM: full    Dental no notable dental hx.    Pulmonary neg pulmonary ROS   Pulmonary exam normal breath sounds clear to auscultation       Cardiovascular Exercise Tolerance: Good negative cardio ROS  Rhythm:regular Rate:Normal     Neuro/Psych negative neurological ROS  negative psych ROS   GI/Hepatic negative GI ROS, Neg liver ROS,,,  Endo/Other  negative endocrine ROS    Renal/GU negative Renal ROS  negative genitourinary   Musculoskeletal   Abdominal   Peds  Hematology negative hematology ROS (+)   Anesthesia Other Findings   Reproductive/Obstetrics negative OB ROS                             Anesthesia Physical Anesthesia Plan  ASA: 2  Anesthesia Plan: General and General ETT   Post-op Pain Management:    Induction:   PONV Risk Score and Plan: Ondansetron  Airway Management Planned:   Additional Equipment:   Intra-op Plan:   Post-operative Plan:   Informed Consent: I have reviewed the patients History and Physical, chart, labs and discussed the procedure including the risks, benefits and alternatives for the proposed anesthesia with the patient or authorized representative who has indicated his/her understanding and acceptance.     Dental Advisory Given  Plan Discussed with: CRNA  Anesthesia Plan Comments:        Anesthesia Quick Evaluation

## 2022-11-19 NOTE — Progress Notes (Signed)
[facility]  Spoke with the patient's family in the consultation room. I explained that he tolerated the procedure without issue.  He has 5 small incisions and one midline incision.  He will be admitted to the floor, and we will give him a clear liquid diet.  He will have the Foley catheter in until tomorrow.  We are going to monitor for return of bowel function.  I have ordered multiple things for pain for him.  All questions answered to their expressed satisfaction.  Plan: -Admit to inpatient -Clear liquid diet, may advance diet as tolerated to full liquids -24 hours of prophylactic Cefotan -AM labs -Multimodal pain regimen with scheduled Tylenol, Celebrex, Robaxin, gabapentin and as needed Roxicodone and Dilaudid -Entereg -Monitor for return of bowel function -Encourage ambulation and pulmonary toilet -SCDs and Lovenox  Thomas Kinds, DO [facility] [address]ella R[address] [place], [place] [id number] [phone] (office)

## 2022-11-19 NOTE — H&P (Signed)
[facility] History and Physical   Thomas Valdez is a 27 y.o. male.  HPI: Patient presents to clinic for follow-up to discuss colectomy for his history of diverticulitis.  Since the last visit in the office, the patient thought that he had an episode of diverticulitis occurring.  He states that his pain got significantly better within a day, and he believes that he just had an episode of food poisoning.  He did not take the prescribed antibiotics.  He denies any abdominal pain, nausea, and vomiting.  He is moving his bowels without issue.  He denies any symptoms like what has previously resulted in his admissions to the hospital for diverticulitis.  Denies fevers and chills   Past Medical History:  Diagnosis Date   Diverticulitis    Syphilis    per patient treated at age 56    Past Surgical History:  Procedure Laterality Date   COLONOSCOPY WITH PROPOFOL N/A 08/23/2022   Procedure: COLONOSCOPY WITH PROPOFOL;  Surgeon: Corbin Ade, MD;  Location: [facility];  Service: Endoscopy;  Laterality: N/A;  1:15 pm, asa 1   none     POLYPECTOMY  08/23/2022   Procedure: POLYPECTOMY;  Surgeon: Corbin Ade, MD;  Location: [facility];  Service: Endoscopy;;    Family History  Problem Relation Age of Onset   Cancer Father        prostate   Heart disease Maternal Grandmother    Inflammatory bowel disease Neg Hx    Colon cancer Neg Hx     Social History   Tobacco Use   Smoking status: Never   Smokeless tobacco: Never  Vaping Use   Vaping status: Never Used  Substance Use Topics   Alcohol use: Yes    Comment: social   Drug use: Yes    Types: Marijuana    Medications: I have reviewed the patient's current medications. Allergies as of 11/19/2022   No Known Allergies     ROS:  Pertinent items are noted in HPI.  Blood pressure 123/79, pulse 72, temperature 97.7 F (36.5 C), temperature source Oral, resp. rate 16, SpO2 98%. Physical Exam Vitals  reviewed.  Constitutional:      Appearance: Normal appearance.  HENT:     Head: Normocephalic and atraumatic.  Eyes:     Extraocular Movements: Extraocular movements intact.     Pupils: Pupils are equal, round, and reactive to light.  Cardiovascular:     Rate and Rhythm: Normal rate.  Pulmonary:     Effort: Pulmonary effort is normal.  Abdominal:     Comments: Abdomen soft, nondistended, no percussion tenderness, nontender to palpation; no rigidity, guarding, rebound tenderness   Musculoskeletal:        General: Normal range of motion.     Cervical back: Normal range of motion.  Skin:    General: Skin is warm and dry.  Neurological:     General: No focal deficit present.     Mental Status: He is alert and oriented to person, place, and time.  Psychiatric:        Mood and Affect: Mood normal.        Behavior: Behavior normal.     Results: Results for orders placed or performed during the hospital encounter of 11/17/22 (from the past 48 hour(s))  Type and screen [facility]     Status: None   Collection Time: 11/17/22  3:09 PM  Result Value Ref Range   ABO/RH(D) A POS  Antibody Screen NEG    Sample Expiration      11/20/2022,2359 Performed at [facility], [address], [place], [place] [address]   Hemoglobin A1c     Status: None   Collection Time: 11/17/22  3:09 PM  Result Value Ref Range   Hgb A1c MFr Bld 5.3 4.8 - 5.6 %    Comment: (NOTE) Pre diabetes:          5.7%-6.4%  Diabetes:              >6.4%  Glycemic control for   <7.0% adults with diabetes    Mean Plasma Glucose 105.41 mg/dL    Comment: Performed at [facility], [address] [place], [place] [address]    No results found.   Assessment & Plan:  [name] is a 27 y.o. male who presents to discuss elective colectomy for diverticulitis.  -We discussed that since he has had multiple episodes of diverticulitis that are life altering and have required admission to the  hospital, it is appropriate to consider colectomy.  The goal of waiting until he was not having an acute flare of diverticulitis was to allow for [place] to create an anastomosis at the time of surgery.  Depending on the amount of inflammation present within the patient's abdomen surgery, he may require creation of a diverting loop ileostomy to allow this area to heal.  There is a small chance that he may require an end colostomy. -We also discussed that the goal of the surgery is to remove the area of the colon that has been causing him significant issues.  If he has diverticulosis and other areas of the colon, he is at risk of developing diverticulitis at these areas in the future -The risks and benefits of robotic assisted laparoscopic sigmoidectomy with possible creation of loop ileostomy were discussed with the patient, including but not limited to bleeding, infection, injury to surrounding structures, and need for additional procedures. Blayde Boat has decided to proceed with surgery -We discussed the need for a mechanical and antibiotic prep prior to surgery.  Prescriptions provided for MiraLAX prep and Flagyl/neomycin -We discussed that this is an inpatient surgery for which she will be admitted after to verify that he progresses appropriately after surgery -Information was provided to the patient regarding colectomy, ileostomy, diverticulitis, and diverticulosis. -Patient did have a chicken sandwich at 5PM yesterday and a cookie at 9PM.  Enema this morning demonstrated yellow output with some flakes of brown.  I discussed that this is less than ideal and he is of slightly higher risk for leak if we perform an anastomosis.  Patient is agreeable and would like to proceed with surgery regardless.  Plan for surgery today   All of the above recommendations were discussed with the patient and patient's family, and all of patient's and family's questions were answered to their expressed  satisfaction.  Theophilus Kinds, DO [facility] [address] [place], [place] [address] [phone] (office)

## 2022-11-19 NOTE — Progress Notes (Signed)
Tap water enema given, results clear with a little seiment.  No formed stool.

## 2022-11-19 NOTE — Op Note (Signed)
[facility] Operative Note  11/19/22  Preoperative Diagnosis: Complicated sigmoid diverticulitis   Postoperative Diagnosis: Same   Procedure(s) Performed: Robotic assisted laparoscopic sigmoidectomy   Surgeon: Theophilus Kinds, DO    Assistants: Cecile Sheerer, RNFA   Anesthesia: General endotracheal   Anesthesiologist: Windell Norfolk, MD    Specimens: Sigmoid colon, stitch marking proximal   Estimated Blood Loss: 300 cc   Blood Replacement: None    Complications: None   Wound Class: Contaminated   Operative Indications: Patient is a 27 year old male who presents for sigmoidectomy.  He has had multiple episodes of complicated diverticulitis, and would like to undergo elective colectomy.  He has also undergone colonoscopy which demonstrated only diverticulosis.  He is agreeable to surgery.  All risks and benefits of performing this procedure were discussed with the patient including pain, infection, bleeding, damage to the surrounding structures, anastomotic leak, and need for more procedures or surgery. The patient voiced understanding of the procedure, all questions were sought and answered, and consent was obtained.  Findings: -Significant inflammatory process of the sigmoid colon with adhesions to small bowel and lateral abdominal wall -Successful EEA anastomosis with no air leak -Adequate hemostasis at completion of case   Procedure: The patient was taken to the operating room and placed supine. General endotracheal anesthesia was induced. Intravenous antibiotics were administered per protocol.  The patient was then put into a low lithotomy position.  An orogastric tube positioned to decompress the stomach. The abdomen was prepared and draped in the usual sterile fashion. A time-out was completed verifying correct patient, procedure, site, positioning, and implant(s) and/or special equipment prior to beginning this procedure.   Veress needle inserted  in the midline, supraumbilically, and after saline droptest, insufflation started up to 15mm Hg without issue. Needle removed and 8mm port placed through same spot via optiview technique.  No injuries noted during insertion.  4 additional ports were then placed along the right abdominal wall, one 12mm port in RLQ, two 8 mm ports and 5mm assist port.     Inspection of the sigmoid colon, noted adhesions to lateral wall and small bowel.  The adhesions were taken down with a combination of blunt dissection, sharp dissection, and electrocautery via vessel sealer.  Lateral attachments were taken down using sharp dissection. This was carried up and around the splenic flexure for further mobilization of colon for anastamosis.  Omental adhesions removed in this area as well.   Area proximal to the thickened colon was chosen.  A window in the mesentery was created, and the colon was transected with a 60 cm blue load stapler.  A transection point was then chosen distal to the thickened colon, and again 81 that was made in the mesentery, and the colon was transected with a 60 cm blue load stapler.  Vessel sealer then used to transect the mesentery below the sigmoid colon.    At this point, the robot was undocked and attention was turned back to the patient for an open incision.  A vertical midline incision was made and dissection carried down to fascia.  The peritoneum was identified, and the abdomen was opened..  Wound protector placed and specimen removed from abdomen and sent to pathology for evaluation.  Proximal colon was marked with a suture.  The proximal colon was not fully able to reach the rectal stump without tension, so further lateral adhesions and splenic flexure were taken down with electrocautery.  Once the proximal colon was felt to adequately reach the  rectal stump without tension, it was pulled through incision and purse string device used, and 29mm EEA anvil placed through hole and tied down.  Prior to  choosing the EEA, an EEA sizer was inserted into the rectum to help determine size of stapler to use.  Edges of the colon were cleaned around the anvil.  EEA stapler placed through the rectal stump and guided to the distal staple line, and the pin was advanced through the rectal stump.  The anvil was then connected to stapler and a end to end anastomosis was created, after confirming no twisting of the proximal mesentery and no tension noted on the proximal colon.  Saline was infused into the pelvis, and a air leak test was performed, and anastomosis did not show any leaks.  All saline was then suctioned out, anastomosis looked viable, and hemostasis was noted throughout the abdomen.  3-0 silk was used to [place] along the lateral aspects of the staple line.  The abdomen was then copiously irrigated, and there was a very small hematoma noted in the left upper quadrant near the base of the spleen.  There was no active bleeding noted.  Surgical snow was inserted into this area.  Hemostasis was again noted throughout the abdomen.   At this time, all participants in the case changed gown and gloves.  The sponge and instrument counts were all noted to be correct.  The abdomen was then redraped with clean towels.  The fascia was then closed with 0 PDS from the superior and inferior aspects of the incision.  0 vicryl was used to close fascial defect at 12mm RLQ port site.  All skin incisions were closed with skin staples.  Laparoscopic incisions were dressed with 2x2s and Tegaderm, and midline incision was dressed with a honeycomb dressing.    Final inspection revealed acceptable hemostasis. All counts were correct at the end of the case. The patient was awakened from anesthesia and extubated without complication.  The patient went to the PACU in stable condition.   Theophilus Kinds, DO  [facility] [address] [place], [place] [address] [phone] (office)

## 2022-11-19 NOTE — Transfer of Care (Signed)
Immediate Anesthesia Transfer of Care Note  Patient: Thomas Valdez  Procedure(s) Performed: XI ROBOT ASSISTED LAPAROSCOPIC SIGMOID COLECTOMY (Abdomen)  Patient Location: PACU  Anesthesia Type:General  Level of Consciousness: awake, alert , and oriented  Airway & Oxygen Therapy: Patient Spontanous Breathing and Patient connected to face mask oxygen  Post-op Assessment: Report given to RN and Post -op Vital signs reviewed and stable  Post vital signs: Reviewed and stable  Last Vitals:  Vitals Value Taken Time  BP 141/88 11/19/22 1500  Temp    Pulse 87 11/19/22 1500  Resp 14 11/19/22 1500  SpO2 100 % 11/19/22 1500  Vitals shown include unfiled device data.  Last Pain:  Vitals:   11/19/22 4098  TempSrc: Oral  PainSc: 0-No pain      Patients Stated Pain Goal: 5 (11/19/22 1191)  Complications: No notable events documented.

## 2022-11-19 NOTE — Brief Op Note (Signed)
11/19/2022  2:58 PM  PATIENT:  Thomas Valdez  27 y.o. male  PRE-OPERATIVE DIAGNOSIS:  DIVERTICULITIS  POST-OPERATIVE DIAGNOSIS:  COMPLICATED DIVERTICULITIS  PROCEDURE:  Procedure(s): XI ROBOT ASSISTED LAPAROSCOPIC SIGMOID COLECTOMY (N/A)  SURGEON:  Surgeons and Role:    * Kalin Kyler, [name], DO - Primary    * Bridges, Leatrice Jewels, MD - Assisting  PHYSICIAN ASSISTANT:   ASSISTANTS: Cecile Sheerer, RNFA   ANESTHESIA:   general  EBL:  300 mL   BLOOD ADMINISTERED:none  DRAINS: none   LOCAL MEDICATIONS USED:  MARCAINE     SPECIMEN:  Source of Specimen:  sigmoid colon  DISPOSITION OF SPECIMEN:  PATHOLOGY  COUNTS:  YES  DICTATION: .Note written in EPIC  PLAN OF CARE: Admit to inpatient   PATIENT DISPOSITION:  PACU - hemodynamically stable.   Delay start of Pharmacological VTE agent (>24hrs) due to surgical blood loss or risk of bleeding: no  Theophilus Kinds, DO [facility] [address] [place], [place] [address] [phone] (office)

## 2022-11-19 NOTE — OR Nursing (Signed)
Pt's mother, Rinaldo Cloud, was updated on pt's status by phone by OR RN Burnis Medin.

## 2022-11-19 NOTE — Anesthesia Procedure Notes (Signed)
Procedure Name: Intubation Date/Time: 11/19/2022 8:12 AM  Performed by: Izola Price., CRNAPre-anesthesia Checklist: Patient identified, Emergency Drugs available, Suction available and Patient being monitored Patient Re-evaluated:Patient Re-evaluated prior to induction Oxygen Delivery Method: Circle system utilized Preoxygenation: Pre-oxygenation with 100% oxygen Induction Type: IV induction Ventilation: Mask ventilation without difficulty Laryngoscope Size: Mac and 4 Grade View: Grade I Tube type: Oral Tube size: 7.5 mm Number of attempts: 1 Airway Equipment and Method: Stylet Placement Confirmation: ETT inserted through vocal cords under direct vision, positive ETCO2 and breath sounds checked- equal and bilateral Secured at: 23 cm Tube secured with: Tape Dental Injury: Teeth and Oropharynx as per pre-operative assessment

## 2022-11-20 LAB — CBC
HCT: 38.6 % — ABNORMAL LOW (ref 39.0–52.0)
Hemoglobin: 12.7 g/dL — ABNORMAL LOW (ref 13.0–17.0)
MCH: 30.2 pg (ref 26.0–34.0)
MCHC: 32.9 g/dL (ref 30.0–36.0)
MCV: 91.9 fL (ref 80.0–100.0)
Platelets: 418 10*3/uL — ABNORMAL HIGH (ref 150–400)
RBC: 4.2 MIL/uL — ABNORMAL LOW (ref 4.22–5.81)
RDW: 13 % (ref 11.5–15.5)
WBC: 8.5 10*3/uL (ref 4.0–10.5)
nRBC: 0 % (ref 0.0–0.2)

## 2022-11-20 LAB — BASIC METABOLIC PANEL
Anion gap: 6 (ref 5–15)
BUN: 9 mg/dL (ref 6–20)
CO2: 28 mmol/L (ref 22–32)
Calcium: 8.4 mg/dL — ABNORMAL LOW (ref 8.9–10.3)
Chloride: 101 mmol/L (ref 98–111)
Creatinine, Ser: 1.04 mg/dL (ref 0.61–1.24)
GFR, Estimated: >60 mL/min (ref >60)
Glucose, Bld: 116 mg/dL — ABNORMAL HIGH (ref 70–99)
Potassium: 4 mmol/L (ref 3.5–5.1)
Sodium: 135 mmol/L (ref 135–145)

## 2022-11-20 MED ORDER — SODIUM CHLORIDE 0.9 % IV SOLN: INTRAVENOUS | Status: DC

## 2022-11-20 NOTE — Progress Notes (Signed)
Foley catheter removed, Stand up weight obtained. Assisted patient with walking in the hallway.

## 2022-11-20 NOTE — Progress Notes (Signed)
Patient alert and verbal, tolerated medication whole with no complaints. Patient complained of pain several times during shift PRN given, see MAR. Patient ambulated in room during shift. Dressing to abdomen intact. Patient tolerated clear liquid diet advanced to full liquid per MD [name].

## 2022-11-20 NOTE — Plan of Care (Signed)
Rested well during the night. Foley catheter just now removed. Patient up to stand up scale and bathroom. No increase in drainage to abdominal dressing.  Problem: Education: Goal: Understanding of discharge needs will improve Outcome: Progressing Goal: Verbalization of understanding of the causes of altered bowel function will improve Outcome: Progressing   Problem: Activity: Goal: Ability to tolerate increased activity will improve Outcome: Progressing   Problem: Health Behavior/Discharge Planning: Goal: Identification of community resources to assist with postoperative recovery needs will improve Outcome: Progressing   Problem: Nutritional: Goal: Will attain and maintain optimal nutritional status will improve Outcome: Progressing   Problem: Clinical Measurements: Goal: Postoperative complications will be avoided or minimized Outcome: Progressing   Problem: Respiratory: Goal: Respiratory status will improve Outcome: Progressing   Problem: Skin Integrity: Goal: Will show signs of wound healing Outcome: Progressing   Problem: Education: Goal: Knowledge of General Education information will improve Description: Including pain rating scale, medication(s)/side effects and non-pharmacologic comfort measures Outcome: Progressing   Problem: Health Behavior/Discharge Planning: Goal: Ability to manage health-related needs will improve Outcome: Progressing   Problem: Clinical Measurements: Goal: Ability to maintain clinical measurements within normal limits will improve Outcome: Progressing Goal: Will remain free from infection Outcome: Progressing Goal: Diagnostic test results will improve Outcome: Progressing Goal: Respiratory complications will improve Outcome: Progressing Goal: Cardiovascular complication will be avoided Outcome: Progressing   Problem: Activity: Goal: Risk for activity intolerance will decrease Outcome: Progressing   Problem: Nutrition: Goal:  Adequate nutrition will be maintained Outcome: Progressing   Problem: Coping: Goal: Level of anxiety will decrease Outcome: Progressing   Problem: Elimination: Goal: Will not experience complications related to bowel motility Outcome: Progressing Goal: Will not experience complications related to urinary retention Outcome: Progressing   Problem: Pain Management: Goal: General experience of comfort will improve Outcome: Progressing   Problem: Safety: Goal: Ability to remain free from injury will improve Outcome: Progressing   Problem: Skin Integrity: Goal: Risk for impaired skin integrity will decrease Outcome: Progressing   Problem: Bowel/Gastric: Goal: Gastrointestinal status for postoperative course will improve Outcome: Not Progressing

## 2022-11-20 NOTE — Anesthesia Postprocedure Evaluation (Signed)
Anesthesia Post Note  Patient: Thomas Valdez  Procedure(s) Performed: XI ROBOT ASSISTED LAPAROSCOPIC SIGMOID COLECTOMY (Abdomen)  Anesthesia Type: General Anesthetic complications: no   No notable events documented.   Last Vitals:  Vitals:   11/20/22 0006 11/20/22 0358  BP: 124/85 120/83  Pulse: 65 (!) 59  Resp: 20 16  Temp: 37.1 C 36.7 C  SpO2: 100% 100%    Last Pain:  Vitals:   11/20/22 0721  TempSrc:   PainSc: 5                  Windell Norfolk

## 2022-11-20 NOTE — Progress Notes (Signed)
[facility] Progress Note  1 Day Post-Op  Subjective: Patient seen and examined.  He is resting comfortably in bed.  He has been drinking water and some prep spray, but denies significant appetite.  He has not tried much more with his clear liquids.  He confirms that he has abdominal pain, especially when attempting to move, but he has been getting up out of bed and ambulated a little in the halls.  He denies any flatus or bowel movement since surgery.  Objective: Vital signs in last 24 hours: Temp:  [97.3 F (36.3 C)-98.9 F (37.2 C)] 98.1 F (36.7 C) (11/09 0358) Pulse Rate:  [59-124] 59 (11/09 0358) Resp:  [14-20] 16 (11/09 0358) BP: (120-145)/(81-99) 120/83 (11/09 0358) SpO2:  [100 %] 100 % (11/09 0358) Weight:  [74 kg-75.2 kg] 74 kg (11/09 0651)    Intake/Output from previous day: 11/08 0701 - 11/09 0700 In: 3169.5 [I.V.:3169.5] Out: 2700 [Urine:2400; Blood:300] Intake/Output this shift: No intake/output data recorded.  General appearance: alert, cooperative, and no distress GI: Abdomen soft, nondistended, no percussion tenderness, incisional tenderness to palpation; no rigidity, guarding, rebound tenderness; midline incision C/D/I with honeycomb dressing in place with mild dried bloody strikethrough; laparoscopic incision sites with 4 x 4's in place and no strikethrough  Lab Results:  Recent Labs    11/20/22 0418  WBC 8.5  HGB 12.7*  HCT 38.6*  PLT 418*   BMET Recent Labs    11/20/22 0418  NA 135  K 4.0  CL 101  CO2 28  GLUCOSE 116*  BUN 9  CREATININE 1.04  CALCIUM 8.4*   PT/INR No results for input(s): "LABPROT", "INR" in the last 72 hours.  Studies/Results: No results found.  Anti-infectives: Anti-infectives (From admission, onward)    Start     Dose/Rate Route Frequency Ordered Stop   11/19/22 2200  cefoTEtan (CEFOTAN) 2 g in sodium chloride 0.9 % 100 mL IVPB        2 g 200 mL/hr over 30 Minutes Intravenous Every 12 hours  11/19/22 1618 11/19/22 2342   11/19/22 0630  cefoTEtan (CEFOTAN) 2 g in sodium chloride 0.9 % 100 mL IVPB        2 g 200 mL/hr over 30 Minutes Intravenous On call to O.R. 11/19/22 0619 11/19/22 0818       Assessment/Plan:  Patient is a 27 year old male who was admitted s/p robotic assisted laparoscopic sigmoid colectomy on 11/8.  Diet: Clear liquids, may advance to full liquids for dinner tonight if patient is able to tolerate clears IVF: Normal saline at 50 cc/h.  Will maintain IV fluids until adequate p.o. intake Foley: Discontinued this morning BM/Flatus: None Entereg: Yes, will continue until bowel function Ambulation within 24 hours of surgery: Yes, patient has been ambulating.  Encouraged further ambulation and incentive spirometry Post-op antibiotics: 24 hour course of Cefotan completed Multimodal pain management: Scheduled Tylenol, Celebrex, gabapentin, and Robaxin; as needed Roxicodone and Dilaudid SCDs and Lovenox ordered Patient's blood work within normal limits    LOS: 1 day    Rechel Delosreyes A Marianne Golightly 11/20/2022

## 2022-11-21 LAB — CBC
HCT: 37.4 % — ABNORMAL LOW (ref 39.0–52.0)
Hemoglobin: 12.3 g/dL — ABNORMAL LOW (ref 13.0–17.0)
MCH: 30.3 pg (ref 26.0–34.0)
MCHC: 32.9 g/dL (ref 30.0–36.0)
MCV: 92.1 fL (ref 80.0–100.0)
Platelets: 374 10*3/uL (ref 150–400)
RBC: 4.06 MIL/uL — ABNORMAL LOW (ref 4.22–5.81)
RDW: 12.7 % (ref 11.5–15.5)
WBC: 6.5 10*3/uL (ref 4.0–10.5)
nRBC: 0 % (ref 0.0–0.2)

## 2022-11-21 NOTE — Plan of Care (Signed)
Patient has been up and walking in the room and the hallway. Small amount of drainage on honeycomb dressing on Abdomen.  Problem: Education: Goal: Understanding of discharge needs will improve Outcome: Progressing Goal: Verbalization of understanding of the causes of altered bowel function will improve Outcome: Progressing   Problem: Activity: Goal: Ability to tolerate increased activity will improve Outcome: Progressing   Problem: Bowel/Gastric: Goal: Gastrointestinal status for postoperative course will improve Outcome: Progressing   Problem: Health Behavior/Discharge Planning: Goal: Identification of community resources to assist with postoperative recovery needs will improve Outcome: Progressing   Problem: Nutritional: Goal: Will attain and maintain optimal nutritional status will improve Outcome: Progressing   Problem: Clinical Measurements: Goal: Postoperative complications will be avoided or minimized Outcome: Progressing   Problem: Skin Integrity: Goal: Will show signs of wound healing Outcome: Progressing   Problem: Education: Goal: Knowledge of General Education information will improve Description: Including pain rating scale, medication(s)/side effects and non-pharmacologic comfort measures Outcome: Progressing   Problem: Health Behavior/Discharge Planning: Goal: Ability to manage health-related needs will improve Outcome: Progressing   Problem: Clinical Measurements: Goal: Ability to maintain clinical measurements within normal limits will improve Outcome: Progressing Goal: Will remain free from infection Outcome: Progressing Goal: Diagnostic test results will improve Outcome: Progressing Goal: Respiratory complications will improve Outcome: Progressing Goal: Cardiovascular complication will be avoided Outcome: Progressing   Problem: Activity: Goal: Risk for activity intolerance will decrease Outcome: Progressing   Problem: Nutrition: Goal:  Adequate nutrition will be maintained Outcome: Progressing   Problem: Coping: Goal: Level of anxiety will decrease Outcome: Progressing   Problem: Elimination: Goal: Will not experience complications related to bowel motility Outcome: Progressing Goal: Will not experience complications related to urinary retention Outcome: Progressing   Problem: Pain Management: Goal: General experience of comfort will improve Outcome: Progressing   Problem: Safety: Goal: Ability to remain free from injury will improve Outcome: Progressing   Problem: Skin Integrity: Goal: Risk for impaired skin integrity will decrease Outcome: Progressing

## 2022-11-21 NOTE — Progress Notes (Signed)
[facility] Progress Note  2 Days Post-Op  Subjective: Patient seen and examined.  He is resting comfortably in bed.  He states that his abdominal pain has improved today.  He was able to tolerate some ice cream without nausea and vomiting.  He denies any flatus or bowel movement since surgery.  He confirms getting up and ambulating around the floor.  Objective: Vital signs in last 24 hours: Temp:  [97.9 F (36.6 C)-99.4 F (37.4 C)] 97.9 F (36.6 C) (11/10 0522) Pulse Rate:  [59-80] 74 (11/10 0910) Resp:  [17-20] 18 (11/10 0910) BP: (117-135)/(76-95) 132/81 (11/10 0910) SpO2:  [99 %-100 %] 99 % (11/10 0910) Weight:  [73.5 kg] 73.5 kg (11/10 0500)    Intake/Output from previous day: 11/09 0701 - 11/10 0700 In: 2210.9 [P.O.:1470; I.V.:740.9] Out: 1250 [Urine:1250] Intake/Output this shift: Total I/O In: 120 [P.O.:120] Out: -   General appearance: alert, cooperative, and no distress GI: Abdomen soft, nondistended, no percussion tenderness, mild incisional tenderness to palpation; no rigidity, guarding, rebound tenderness; midline incision C/D/I with honeycomb dressing in place and mild dried bloody strikethrough; laparoscopic incision sites with 4 x 4's in place and no strikethrough  Lab Results:  Recent Labs    11/20/22 0418 11/21/22 0338  WBC 8.5 6.5  HGB 12.7* 12.3*  HCT 38.6* 37.4*  PLT 418* 374   BMET Recent Labs    11/20/22 0418  NA 135  K 4.0  CL 101  CO2 28  GLUCOSE 116*  BUN 9  CREATININE 1.04  CALCIUM 8.4*   PT/INR No results for input(s): "LABPROT", "INR" in the last 72 hours.  Studies/Results: No results found.  Anti-infectives: Anti-infectives (From admission, onward)    Start     Dose/Rate Route Frequency Ordered Stop   11/19/22 2200  cefoTEtan (CEFOTAN) 2 g in sodium chloride 0.9 % 100 mL IVPB        2 g 200 mL/hr over 30 Minutes Intravenous Every 12 hours 11/19/22 1618 11/19/22 2342   11/19/22 0630  cefoTEtan  (CEFOTAN) 2 g in sodium chloride 0.9 % 100 mL IVPB        2 g 200 mL/hr over 30 Minutes Intravenous On call to O.R. 11/19/22 0619 11/19/22 0818       Assessment/Plan:  Patient is a 27 year old male who was admitted s/p robotic assisted laparoscopic sigmoid colectomy on 11/8.   Diet: Patient was able to tolerate a small mount of fulls.  Will advance to soft diet.  Advised that the patient should take it slow, and if he begins to have nausea or vomiting with his diet, he should back off IVF: Discontinued Foley: Discontinued on 11/9 BM/Flatus: None Entereg: Yes, will continue until bowel function Ambulation within 24 hours of surgery: Yes, patient has been ambulating.  Encouraged further ambulation and incentive spirometry Post-op antibiotics: 24 hour course of Cefotan completed Multimodal pain management: Scheduled Tylenol, Celebrex, gabapentin, and Robaxin; as needed Roxicodone and Dilaudid SCDs and Lovenox ordered Patient's hemoglobin remained stable on repeat blood work this morning, 12.3 from 12.7   LOS: 2 days    Thomas Valdez A Shanie Mauzy 11/21/2022

## 2022-11-22 ENCOUNTER — Other Ambulatory Visit: Payer: Self-pay

## 2022-11-22 LAB — SURGICAL PATHOLOGY

## 2022-11-22 NOTE — Progress Notes (Signed)
Pt has ambulated multiple times in the hallway today and tolerated well. Pt is taking full liquids/soft diet and tolerating well. Pt has passed some flatus and has also passed two separate stools that were mucous with old clotted blood. Pt states he still feels like he needs to pass gas. Encouraged to continue to walk as much as possible. Pt did state that he had increased abd pain not relieved with oral pain meds & nausea after passing second stool.Pt medicated per order.

## 2022-11-22 NOTE — Plan of Care (Signed)
Tolerating food and fluid without difficulty. Ambulating well, passing flatus and having small BM's today. Pain controlled with ordered pain meds.   Problem: Education: Goal: Understanding of discharge needs will improve Outcome: Progressing Goal: Verbalization of understanding of the causes of altered bowel function will improve Outcome: Progressing   Problem: Activity: Goal: Ability to tolerate increased activity will improve Outcome: Progressing   Problem: Bowel/Gastric: Goal: Gastrointestinal status for postoperative course will improve Outcome: Progressing   Problem: Health Behavior/Discharge Planning: Goal: Identification of community resources to assist with postoperative recovery needs will improve Outcome: Progressing   Problem: Nutritional: Goal: Will attain and maintain optimal nutritional status will improve Outcome: Progressing   Problem: Clinical Measurements: Goal: Postoperative complications will be avoided or minimized Outcome: Progressing   Problem: Skin Integrity: Goal: Will show signs of wound healing Outcome: Progressing   Problem: Education: Goal: Knowledge of General Education information will improve Description: Including pain rating scale, medication(s)/side effects and non-pharmacologic comfort measures Outcome: Progressing   Problem: Health Behavior/Discharge Planning: Goal: Ability to manage health-related needs will improve Outcome: Progressing   Problem: Clinical Measurements: Goal: Ability to maintain clinical measurements within normal limits will improve Outcome: Progressing Goal: Will remain free from infection Outcome: Progressing Goal: Diagnostic test results will improve Outcome: Progressing Goal: Respiratory complications will improve Outcome: Progressing Goal: Cardiovascular complication will be avoided Outcome: Progressing   Problem: Activity: Goal: Risk for activity intolerance will decrease Outcome: Progressing    Problem: Nutrition: Goal: Adequate nutrition will be maintained Outcome: Progressing   Problem: Coping: Goal: Level of anxiety will decrease Outcome: Progressing   Problem: Elimination: Goal: Will not experience complications related to bowel motility Outcome: Progressing Goal: Will not experience complications related to urinary retention Outcome: Progressing   Problem: Pain Management: Goal: General experience of comfort will improve Outcome: Progressing   Problem: Safety: Goal: Ability to remain free from injury will improve Outcome: Progressing   Problem: Skin Integrity: Goal: Risk for impaired skin integrity will decrease Outcome: Progressing

## 2022-11-22 NOTE — Discharge Instructions (Signed)

## 2022-11-22 NOTE — Progress Notes (Signed)
Pt does not have PCP. LCSW added PCP list to AVS.    11/22/22 0749  TOC Brief Assessment  Insurance and Status Reviewed  Patient has primary care physician No  Home environment has been reviewed Lives with mother.  Prior level of function: Independent.  Prior/Current Home Services No current home services  Social Determinants of Health Reivew SDOH reviewed no interventions necessary  Readmission risk has been reviewed Yes  Transition of care needs no transition of care needs at this time

## 2022-11-22 NOTE — Progress Notes (Signed)
[facility] Progress Note  3 Days Post-Op  Subjective: Patient seen and examined.  He is resting comfortably in bed.  He was able to tolerate a soft diet without nausea and vomiting.  He still denies flatus and bowel movements, though he did feel like he might need to pass gas soon.  He has been ambulating around the floors.  His pain is controlled with oral medications.  Objective: Vital signs in last 24 hours: Temp:  [97.5 F (36.4 C)-99.3 F (37.4 C)] 97.5 F (36.4 C) (11/11 0835) Pulse Rate:  [74-86] 84 (11/11 0835) Resp:  [18-20] 18 (11/11 0835) BP: (110-129)/(77-85) 126/85 (11/11 0835) SpO2:  [97 %-100 %] 99 % (11/11 0835) Weight:  [72.8 kg] 72.8 kg (11/11 0517)    Intake/Output from previous day: 11/10 0701 - 11/11 0700 In: 480 [P.O.:480] Out: -  Intake/Output this shift: Total I/O In: 240 [P.O.:240] Out: -   General appearance: alert, cooperative, and no distress GI: Abdomen soft, nondistended, no percussion tenderness, mild incisional tenderness to palpation; no rigidity, guarding, rebound tenderness; midline incision C/D/I with honeycomb dressing in place and a small amount of stable dried bloody strikethrough; laparoscopic incisions with gauze dressing in place, no strikethrough  Lab Results:  Recent Labs    11/20/22 0418 11/21/22 0338  WBC 8.5 6.5  HGB 12.7* 12.3*  HCT 38.6* 37.4*  PLT 418* 374   BMET Recent Labs    11/20/22 0418  NA 135  K 4.0  CL 101  CO2 28  GLUCOSE 116*  BUN 9  CREATININE 1.04  CALCIUM 8.4*   PT/INR No results for input(s): "LABPROT", "INR" in the last 72 hours.  Studies/Results: No results found.  Anti-infectives: Anti-infectives (From admission, onward)    Start     Dose/Rate Route Frequency Ordered Stop   11/19/22 2200  cefoTEtan (CEFOTAN) 2 g in sodium chloride 0.9 % 100 mL IVPB        2 g 200 mL/hr over 30 Minutes Intravenous Every 12 hours 11/19/22 1618 11/19/22 2342   11/19/22 0630  cefoTEtan  (CEFOTAN) 2 g in sodium chloride 0.9 % 100 mL IVPB        2 g 200 mL/hr over 30 Minutes Intravenous On call to O.R. 11/19/22 0619 11/19/22 0818       Assessment/Plan:  Patient is a 27 year old male who was admitted s/p robotic assisted laparoscopic sigmoid colectomy on 11/8.   Diet: GI soft.  Patient tolerating diet without nausea and vomiting IVF: Discontinued on 11/10 Foley: Discontinued on 11/9 BM/Flatus: None Entereg: Yes, will continue until bowel function Ambulation within 24 hours of surgery: Yes, patient has been ambulating.  Encouraged further ambulation and incentive spirometry Post-op antibiotics: 24 hour course of Cefotan completed Multimodal pain management: Scheduled Tylenol, Celebrex, gabapentin, and Robaxin; as needed Roxicodone and Dilaudid SCDs and Lovenox ordered Discussed with patient that we are now awaiting bowel function prior to discharge   LOS: 3 days    Daris Aristizabal A Ludella Pranger 11/22/2022

## 2022-11-22 NOTE — Plan of Care (Signed)
  Problem: Activity: Goal: Ability to tolerate increased activity will improve Outcome: Not Progressing   Problem: Bowel/Gastric: Goal: Gastrointestinal status for postoperative course will improve Outcome: Not Progressing

## 2022-11-23 ENCOUNTER — Encounter (HOSPITAL_COMMUNITY): Payer: Self-pay | Admitting: Surgery

## 2022-11-23 MED ORDER — CELECOXIB 200 MG PO CAPS: 200.0000 mg | ORAL_CAPSULE | Freq: Two times a day (BID) | ORAL | 0 refills | Status: AC

## 2022-11-23 MED ORDER — ACETAMINOPHEN 500 MG PO TABS: 1000.0000 mg | ORAL_TABLET | Freq: Four times a day (QID) | ORAL | 0 refills | Status: AC

## 2022-11-23 MED ORDER — METHOCARBAMOL 500 MG PO TABS: 500.0000 mg | ORAL_TABLET | Freq: Three times a day (TID) | ORAL | 0 refills | Status: AC

## 2022-11-23 MED ORDER — ONDANSETRON HCL 4 MG PO TABS: 4.0000 mg | ORAL_TABLET | Freq: Every day | ORAL | 0 refills | Status: DC | PRN

## 2022-11-23 MED ORDER — OXYCODONE HCL 5 MG PO TABS: 5.0000 mg | ORAL_TABLET | Freq: Four times a day (QID) | ORAL | 0 refills | Status: DC | PRN

## 2022-11-23 MED ORDER — GABAPENTIN 300 MG PO CAPS: 300.0000 mg | ORAL_CAPSULE | Freq: Two times a day (BID) | ORAL | 0 refills | Status: DC

## 2022-11-23 NOTE — Plan of Care (Signed)
  Problem: Education: Goal: Understanding of discharge needs will improve Outcome: Progressing   Problem: Activity: Goal: Ability to tolerate increased activity will improve Outcome: Progressing   Problem: Bowel/Gastric: Goal: Gastrointestinal status for postoperative course will improve Outcome: Progressing   Problem: Health Behavior/Discharge Planning: Goal: Identification of community resources to assist with postoperative recovery needs will improve Outcome: Progressing   Problem: Nutritional: Goal: Will attain and maintain optimal nutritional status will improve Outcome: Progressing   Problem: Clinical Measurements: Goal: Postoperative complications will be avoided or minimized Outcome: Progressing   Problem: Skin Integrity: Goal: Will show signs of wound healing Outcome: Progressing   Problem: Clinical Measurements: Goal: Will remain free from infection Outcome: Progressing   Problem: Elimination: Goal: Will not experience complications related to bowel motility Outcome: Progressing   Problem: Pain Management: Goal: General experience of comfort will improve Outcome: Progressing

## 2022-11-23 NOTE — Progress Notes (Signed)
Discharge paperwork given and went over with patients, no further questions at this time. Patient wheeled down to main lobby via wheelchair by staff.

## 2022-11-23 NOTE — Discharge Summary (Signed)
Physician Discharge Summary  Patient ID: Thomas Valdez MRN: [id number] DOB/AGE: 1995-01-22 27 y.o.  Admit date: 11/19/2022 Discharge date: 11/23/2022  Admission Diagnoses: Diverticulitis large intestine  Discharge Diagnoses:  Principal Problem:   Diverticulitis large intestine   Discharged Condition: stable  Hospital Course: Patient is a 27 year old male who was admitted status post robotic assisted laparoscopic sigmoid colectomy for history of complicated diverticulitis.  Postoperatively, he was able to tolerate a diet, and began having bowel function.  He is currently passing flatus and having bowel movements.  His pain is controlled with oral medications.  He is stable for discharge home.  He will follow-up with me in 2 weeks for staple removal.  He was discharged home with prescriptions for Tylenol, Roxicodone, Celebrex, gabapentin, Robaxin, and Zofran.  Consults: None  Significant Diagnostic Studies: None  Treatments: IV hydration, analgesia: acetaminophen, Dilaudid, and oxycodone, robaxin, celebrex, and surgery: Assisted laparoscopic sigmoid colectomy  Discharge Exam: Blood pressure 132/77, pulse 73, temperature 98.8 F (37.1 C), temperature source Oral, resp. rate 20, weight 72.6 kg, SpO2 99%. General appearance: alert, cooperative, and no distress GI: Abdomen soft, nondistended, no percussion tenderness, nontender to palpation; no rigidity, guarding, rebound tenderness; incision sites with skin staples in place, C/D/I  Disposition: Discharge disposition: 01-Home or Self Care       Discharge Instructions     Call MD for:  persistant nausea and vomiting   Complete by: As directed    Call MD for:  redness, tenderness, or signs of infection (pain, swelling, redness, odor or green/yellow discharge around incision site)   Complete by: As directed    Call MD for:  severe uncontrolled pain   Complete by: As directed    Call MD for:  temperature >100.4   Complete by:  As directed    Diet - low sodium heart healthy   Complete by: As directed    Increase activity slowly   Complete by: As directed       Allergies as of 11/23/2022   No Known Allergies      Medication List     STOP taking these medications    amoxicillin-clavulanate 875-125 MG tablet Commonly known as: AUGMENTIN   metroNIDAZOLE 500 MG tablet Commonly known as: Flagyl   neomycin 500 MG tablet Commonly known as: MYCIFRADIN       TAKE these medications    acetaminophen 500 MG tablet Commonly known as: TYLENOL Take 2 tablets (1,000 mg total) by mouth every 6 (six) hours for 14 days. What changed: when to take this   celecoxib 200 MG capsule Commonly known as: CELEBREX Take 1 capsule (200 mg total) by mouth 2 (two) times daily for 14 days.   gabapentin 300 MG capsule Commonly known as: NEURONTIN Take 1 capsule (300 mg total) by mouth 2 (two) times daily for 14 days.   methocarbamol 500 MG tablet Commonly known as: ROBAXIN Take 1 tablet (500 mg total) by mouth 3 (three) times daily for 14 days.   ondansetron 4 MG tablet Commonly known as: Zofran Take 1 tablet (4 mg total) by mouth daily as needed for nausea or vomiting. What changed:  when to take this reasons to take this   oxyCODONE 5 MG immediate release tablet Commonly known as: Oxy IR/ROXICODONE Take 1 tablet (5 mg total) by mouth every 6 (six) hours as needed for moderate pain (pain score 4-6).   valACYclovir 500 MG tablet Commonly known as: VALTREX Take 500 mg by mouth 2 (two) times daily as  needed (fever blisters).        Follow-up Information     Britton Bera, [name], DO. Call.   Specialty: General Surgery Why: Call to schedule follow-up appointment in 2 weeks Contact information: [address] [name] [facility] [address] [phone]                 Signed: Santina Evans A Shanae Luo 11/23/2022, 2:06 PM

## 2022-11-29 ENCOUNTER — Other Ambulatory Visit: Payer: Self-pay | Admitting: *Deleted

## 2022-11-29 MED ORDER — OXYCODONE HCL 5 MG PO TABS: 5.0000 mg | ORAL_TABLET | Freq: Four times a day (QID) | ORAL | 0 refills | Status: DC | PRN

## 2022-11-29 NOTE — Telephone Encounter (Signed)
Surgical Date: 11/19/2022 Procedure: XI ROBOT ASSISTED LAPAROSCOPIC SIGMOID COLECTOMY   Received call from patient [phone]~ telephone.   Patient requested refill on Oxycodone. Last prescription given on 11/23/2022, #16 tabs.   Ok to refill?

## 2022-11-30 ENCOUNTER — Telehealth: Payer: Federal, State, Local not specified - PPO | Admitting: Family Medicine

## 2022-11-30 DIAGNOSIS — A6000 Herpesviral infection of urogenital system, unspecified: Secondary | ICD-10-CM

## 2022-11-30 NOTE — Progress Notes (Signed)
Because you were given treatment last month, you will need to be seen in person and contact a PCP for on going refills for this. Your condition warrants further evaluation and I recommend that you be seen in a face to face visit.   NOTE: There will be NO CHARGE for this eVisit   If you are having a true medical emergency please call 911.

## 2022-12-02 ENCOUNTER — Encounter: Payer: Self-pay | Admitting: Urgent Care

## 2022-12-02 ENCOUNTER — Ambulatory Visit (INDEPENDENT_AMBULATORY_CARE_PROVIDER_SITE_OTHER): Payer: Federal, State, Local not specified - PPO | Admitting: Urgent Care

## 2022-12-02 VITALS — BP 125/79 | HR 73 | Temp 98.5°F | Ht 70.0 in | Wt 169.0 lb

## 2022-12-02 DIAGNOSIS — Z8719 Personal history of other diseases of the digestive system: Secondary | ICD-10-CM

## 2022-12-02 DIAGNOSIS — A6000 Herpesviral infection of urogenital system, unspecified: Secondary | ICD-10-CM | POA: Diagnosis not present

## 2022-12-02 DIAGNOSIS — Z9049 Acquired absence of other specified parts of digestive tract: Secondary | ICD-10-CM

## 2022-12-02 MED ORDER — VALACYCLOVIR HCL 500 MG PO TABS: 500.0000 mg | ORAL_TABLET | Freq: Two times a day (BID) | ORAL | 5 refills | Status: AC

## 2022-12-02 NOTE — Patient Instructions (Signed)
I'm glad you are healing well after surgery. Keep your post op appointment as scheduled.  I have refilled your valtrex. Take one tab every 12 hours for 3 days during an acute flare. If you notice 5 or more episodes in a year, you may consider daily suppressive therapy.  Please schedule an annual physical in 6 months.

## 2022-12-02 NOTE — Assessment & Plan Note (Signed)
12 days postoperative from resection of 5 inches of colon due to recurrent diverticulitis. No current GI issues, bowel movements are regular, and no blood in stool. Pain from surgery is managed with Celebrex, gabapentin and muscle relaxer. -Continue current postoperative care. -Ensure adequate hydration due to potential for dehydration from reduced water absorption in colon. -keep f/u appt next week with surgeon

## 2022-12-02 NOTE — Progress Notes (Signed)
New Patient Office Visit  Subjective:  Patient ID: Thomas Valdez, male    DOB: 08/02/1995  Age: 27 y.o. MRN: [id number]  CC:  Chief Complaint  Patient presents with   Establish Care    New pt est care. Needs a refill on Valtrex.    Discussed the use of AI software for clinical note transcription with the patient who gave verbal consent to proceed.   HPI Thomas Valdez presents to establish care.  History of Present Illness The patient, a 27 year old with a recent diagnosis of diverticulitis, is twelve days post-operative from a surgical intervention for the same. He reports a history of recurrent abdominal pain and hospitalizations due to diverticulitis flare-ups, which led to the surgical decision. The patient notes a significant improvement in his abdominal discomfort post-surgery, despite the ongoing healing process from the operation. He denies any current gastrointestinal issues, noting regular bowel movements without any blood or significant discomfort.  In addition to his recent surgical history, the patient also reports occasional flare-ups of genital HSV, for which he takes valacyclovir as needed. He estimates experiencing two to four flare-ups per year, primarily presenting on the outer area of his buttocks. The patient expresses a desire to manage this condition more effectively and requests a refill of his medication. Feels he has an active flare at present time.  The patient's recovery from surgery appears to be progressing well, with no complications reported. He is scheduled for a follow-up appointment with his surgeon next week. Despite the recent surgery and ongoing recovery, the patient reports feeling better overall, with significant relief from the abdominal discomfort previously caused by diverticulitis. He is currently on a fourteen-day course of Celebrex, gabapentin and a muscle relaxer for post-operative pain management.  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 He is choosing to establish here as all his specialists are still located in this area. His family lives in [place] and he visits often. He is the youngest of 5 siblings.   Outpatient Encounter Medications as of 12/02/2022  Medication Sig   acetaminophen (TYLENOL) 500 MG tablet Take 2 tablets (1,000 mg total) by mouth every 6 (six) hours for 14 days.   celecoxib (CELEBREX) 200 MG capsule Take 1 capsule (200 mg total) by mouth 2 (two) times daily for 14 days.   gabapentin (NEURONTIN) 300 MG capsule Take 1 capsule (300 mg total) by mouth 2 (two) times daily for 14 days.   methocarbamol (ROBAXIN) 500 MG tablet Take 1 tablet (500 mg total) by mouth 3 (three) times daily for 14 days.   oxyCODONE (OXY IR/ROXICODONE) 5 MG immediate release tablet Take 1 tablet (5 mg total) by mouth every 6 (six) hours as needed for moderate pain (pain score 4-6).   [DISCONTINUED] valACYclovir (VALTREX) 500 MG tablet Take 500 mg by mouth 2 (two) times daily as needed (fever blisters).   ondansetron (ZOFRAN) 4 MG tablet Take 1 tablet (4 mg total) by mouth daily as needed for nausea or vomiting. (Patient not taking: Reported on 12/02/2022)   valACYclovir (VALTREX) 500 MG tablet Take 1 tablet (500 mg total) by mouth 2 (two) times daily for 3 days.   No facility-administered encounter medications on file as of 12/02/2022.    Past Medical History:  Diagnosis Date   Diverticulitis    Syphilis    per patient treated at age 3    Past Surgical History:  Procedure Laterality Date   COLONOSCOPY WITH PROPOFOL N/A 08/23/2022  Procedure: COLONOSCOPY WITH PROPOFOL;  Surgeon: Corbin Ade, MD;  [facility];  Service: Endoscopy;  Laterality: N/A;  1:15 pm, asa 1   none     POLYPECTOMY  08/23/2022   Procedure: POLYPECTOMY;  Surgeon: Corbin Ade, MD;  Location: [facility];  Service: Endoscopy;;    Family History  Problem Relation Age of Onset   Cancer Father        prostate    Heart disease Maternal Grandmother    Inflammatory bowel disease Neg Hx    Colon cancer Neg Hx     Social History   Socioeconomic History   Marital status: Single    Spouse name: Not on file   Number of children: Not on file   Years of education: Not on file   Highest education level: Some college, no degree  Occupational History   Not on file  Tobacco Use   Smoking status: Never   Smokeless tobacco: Never  Vaping Use   Vaping status: Never Used  Substance and Sexual Activity   Alcohol use: Yes    Comment: social   Drug use: Yes    Types: Marijuana   Sexual activity: Yes    Partners: Female, Male  Other Topics Concern   Not on file  Social History Narrative   Not on file   Social Determinants of Health   Financial Resource Strain: Low Risk  (06/23/2022)   Received from [facility]   Overall Financial Resource Strain (CARDIA)    Difficulty of Paying Living Expenses: Not hard at all  Food Insecurity: No Food Insecurity (11/22/2022)   Hunger Vital Sign    Worried About Running Out of Food in the Last Year: Never true    Ran Out of Food in the Last Year: Never true  Transportation Needs: No Transportation Needs (11/22/2022)   PRAPARE - Administrator, Civil Service (Medical): No    Lack of Transportation (Non-Medical): No  Physical Activity: Not on file  Stress: Not on file  Social Connections: Not on file  Intimate Partner Violence: Not At Risk (11/22/2022)   Humiliation, Afraid, Rape, and Kick questionnaire    Fear of Current or Ex-Partner: No    Emotionally Abused: No    Physically Abused: No    Sexually Abused: No    ROS: as noted in HPI  Objective:  BP 125/79   Pulse 73   Temp 98.5 F (36.9 C) (Oral)   Ht 5\' 10"  (1.778 m)   Wt 169 lb (76.7 kg)   SpO2 98%   BMI 24.25 kg/m   Physical Exam Vitals and nursing note reviewed.  Constitutional:      General: He is not in acute distress.     Appearance: Normal appearance. He is not ill-appearing, toxic-appearing or diaphoretic.  HENT:     Head: Normocephalic and atraumatic.     Right Ear: Tympanic membrane, ear canal and external ear normal. There is no impacted cerumen.     Left Ear: Tympanic membrane, ear canal and external ear normal. There is no impacted cerumen.     Nose: Nose normal.     Mouth/Throat:     Mouth: Mucous membranes are moist.     Pharynx: Oropharynx is clear. No oropharyngeal exudate or posterior oropharyngeal erythema.  Eyes:     General: No scleral icterus.       Right eye: No discharge.  Left eye: No discharge.     Extraocular Movements: Extraocular movements intact.     Pupils: Pupils are equal, round, and reactive to light.  Neck:     Thyroid: No thyroid mass, thyromegaly or thyroid tenderness.  Cardiovascular:     Rate and Rhythm: Normal rate and regular rhythm.     Pulses: Normal pulses.     Heart sounds: No murmur heard. Pulmonary:     Effort: Pulmonary effort is normal. No respiratory distress.     Breath sounds: Normal breath sounds. No stridor. No wheezing or rhonchi.  Abdominal:     General: Abdomen is flat. There is no distension.     Palpations: Abdomen is soft.     Comments: Surgical scars noted to the abdomen, staples in place. Healing well, no signs of infection  Musculoskeletal:     Cervical back: Normal range of motion and neck supple. No rigidity or tenderness.     Right lower leg: No edema.     Left lower leg: No edema.  Lymphadenopathy:     Cervical: No cervical adenopathy.  Skin:    General: Skin is warm and dry.     Coloration: Skin is not jaundiced.     Findings: No bruising or erythema.  Neurological:     General: No focal deficit present.     Mental Status: He is alert and oriented to person, place, and time.     Sensory: No sensory deficit.     Motor: No weakness.  Psychiatric:        Mood and Affect: Mood normal.        Behavior: Behavior normal.      Last CBC Lab Results  Component Value Date   WBC 6.5 11/21/2022   HGB 12.3 (L) 11/21/2022   HCT 37.4 (L) 11/21/2022   MCV 92.1 11/21/2022   MCH 30.3 11/21/2022   RDW 12.7 11/21/2022   PLT 374 11/21/2022   Last metabolic panel Lab Results  Component Value Date   GLUCOSE 116 (H) 11/20/2022   NA 135 11/20/2022   K 4.0 11/20/2022   CL 101 11/20/2022   CO2 28 11/20/2022   BUN 9 11/20/2022   CREATININE 1.04 11/20/2022   GFRNONAA >60 11/20/2022   CALCIUM 8.4 (L) 11/20/2022   PHOS 4.9 (H) 08/13/2021   PROT 8.6 (H) 11/12/2022   ALBUMIN 4.5 11/12/2022   LABGLOB 2.8 01/21/2021   AGRATIO 1.8 01/21/2021   BILITOT 0.9 11/12/2022   ALKPHOS 70 11/12/2022   AST 20 11/12/2022   ALT 18 11/12/2022   ANIONGAP 6 11/20/2022      Assessment & Plan:  Recurrent genital HSV (herpes simplex virus) infection Assessment & Plan: Infrequent flare-ups (2-4 times per year) on buttocks. Previously managed with valacyclovir as needed. -Prescribe valacyclovir for flare-ups with refills. -Consider daily prophylaxis if frequency of flare-ups increases.  Orders: -     valACYclovir HCl; Take 1 tablet (500 mg total) by mouth 2 (two) times daily for 3 days.  Dispense: 6 tablet; Refill: 5  History of colonic diverticulitis  Status post partial resection of colon Assessment & Plan: 12 days postoperative from resection of 5 inches of colon due to recurrent diverticulitis. No current GI issues, bowel movements are regular, and no blood in stool. Pain from surgery is managed with Celebrex, gabapentin and muscle relaxer. -Continue current postoperative care. -Ensure adequate hydration due to potential for dehydration from reduced water absorption in colon. -keep f/u appt next week with surgeon   Pt  requesting valtrex for PRN use only at this time due to infrequent flare ups. Discussed daily prophylaxis in future if flare ups increase. Pt doing well s/p partial colectomy. Pt to schedule annual PE  around May 2025.   Return in about 6 months (around 06/01/2023) for Annual Physical.   Maretta Bees, PA

## 2022-12-02 NOTE — Assessment & Plan Note (Signed)
Infrequent flare-ups (2-4 times per year) on buttocks. Previously managed with valacyclovir as needed. -Prescribe valacyclovir for flare-ups with refills. -Consider daily prophylaxis if frequency of flare-ups increases.

## 2022-12-08 ENCOUNTER — Ambulatory Visit: Payer: Federal, State, Local not specified - PPO | Admitting: Surgery

## 2022-12-08 ENCOUNTER — Other Ambulatory Visit: Payer: Self-pay

## 2022-12-08 ENCOUNTER — Encounter: Payer: Self-pay | Admitting: Surgery

## 2022-12-08 VITALS — BP 109/72 | HR 70 | Temp 98.2°F | Resp 18 | Ht 70.0 in | Wt 173.0 lb

## 2022-12-08 DIAGNOSIS — Z09 Encounter for follow-up examination after completed treatment for conditions other than malignant neoplasm: Secondary | ICD-10-CM

## 2022-12-08 MED ORDER — METHOCARBAMOL 500 MG PO TABS: 500.0000 mg | ORAL_TABLET | Freq: Three times a day (TID) | ORAL | 0 refills | Status: AC

## 2022-12-08 NOTE — Progress Notes (Unsigned)
[facility] Note   HPI:  27 y.o. Male presents to clinic for post-op follow-up status post robotic assisted laparoscopic sigmoidectomy on 11/8.  Patient states that overall he has been doing well since the surgery.  He is tolerating a diet without nausea and vomiting, and moving his bowels without issue.  He is intermittently having some lower abdominal pain especially when laying down, but states that it is slowly improving.  He denies fevers and chills.  He denies issues at his incision sites.  Review of Systems:  All other review of systems: otherwise negative   Vital Signs:  BP 109/72   Pulse 70   Temp 98.2 F (36.8 C) (Oral)   Resp 18   Ht 5\' 10"  (1.778 m)   Wt 173 lb (78.5 kg)   SpO2 98%   BMI 24.82 kg/m    Physical Exam:  Physical Exam Vitals reviewed.  Constitutional:      Appearance: Normal appearance.  Abdominal:     Comments: Abdomen soft, nondistended, no percussion tenderness, mild lower incisional tenderness to palpation; no rigidity, guarding, rebound tenderness; incisions healing well with skin staples in place  Neurological:     Mental Status: He is alert.     Laboratory studies: None   Imaging:  None  Pathology: A. COLON, SIGMOID, RESECTION:       Segment of colon with diverticulosis, diverticulitis, intramural  acute and chronic inflammation and abscess formation.       Two benign lymph nodes.       Negative for dysplasia or malignancy.    Assessment:  27 y.o. yo Male who presents for follow-up status post robotic assisted laparoscopic sigmoidectomy on 11/8  Plan:  -Patient overall doing well since his surgery.  He is tolerating a diet, moving his bowels, and pain well-controlled -Skin staples removed and Steri-Strips applied at the midline incision site.  Advised that the Steri-Strips will come off in the next 5 to 7 days -Prescription provided to the patient for Robaxin, as he is still having some lower abdominal pain at his  incision site -Follow up me in a couple of weeks.  At that time, if patient's pain is significantly improved, we will plan to release him to return back to work  All of the above recommendations were discussed with the patient, and all of patient's questions were answered to his expressed satisfaction.  Theophilus Kinds, DO [facility] [address] [place], [place] [address] [phone] (office)

## 2022-12-21 ENCOUNTER — Other Ambulatory Visit: Payer: Self-pay

## 2022-12-21 ENCOUNTER — Ambulatory Visit (INDEPENDENT_AMBULATORY_CARE_PROVIDER_SITE_OTHER): Payer: Federal, State, Local not specified - PPO | Admitting: Surgery

## 2022-12-21 ENCOUNTER — Encounter: Payer: Self-pay | Admitting: Surgery

## 2022-12-21 VITALS — BP 112/74 | HR 77 | Temp 98.1°F | Resp 20 | Ht 70.0 in | Wt 174.0 lb

## 2022-12-21 DIAGNOSIS — Z09 Encounter for follow-up examination after completed treatment for conditions other than malignant neoplasm: Secondary | ICD-10-CM

## 2022-12-24 NOTE — Progress Notes (Signed)
[facility] Note   HPI:  27 y.o. Male presents to clinic for post-op follow-up status post robotic assisted laparoscopic sigmoidectomy on 11/8.  Patient has continued to do well and his pain has significantly improved.  He continues to tolerate a diet without nausea and vomiting, and is moving his bowels without issue.  He only complains of a little bit of swelling on the right side of his incision site.  Denies fevers and chills.  Review of Systems:  All other review of systems: otherwise negative   Vital Signs:  BP 112/74   Pulse 77   Temp 98.1 F (36.7 C) (Oral)   Resp 20   Ht 5\' 10"  (1.778 m)   Wt 174 lb (78.9 kg)   SpO2 96%   BMI 24.97 kg/m    Physical Exam:  Physical Exam Vitals reviewed.  Constitutional:      Appearance: Normal appearance.  Abdominal:     Comments: Abdomen soft, nondistended, no percussion tenderness, nontender to palpation; no rigidity, guarding, rebound tenderness; well-healing incision sites, very minimal fullness on the right side of his inferior aspect of his midline incision, no induration, erythema, or underlying palpable abnormalities  Neurological:     Mental Status: He is alert.     Laboratory studies: None  Imaging:  None  Assessment:  27 y.o. yo Male who presents for follow-up status post robotic assisted laparoscopic sigmoidectomy on 11/8.  Plan:  -Patient continues to do well from surgery, tolerating a diet, and moving his bowels -Pain is well-controlled at this time -Advised that patient is good to return to work next week.  He will reach out to our office if he needs a work note -Follow up with me as needed  All of the above recommendations were discussed with the patient, and all of patient's questions were answered to his expressed satisfaction.  Thomas Kinds, DO [facility] [address] [place], [place] [address] [phone] (office)

## 2023-05-02 ENCOUNTER — Other Ambulatory Visit (HOSPITAL_COMMUNITY)
Admission: RE | Admit: 2023-05-02 | Discharge: 2023-05-02 | Disposition: A | Discharge status: Home / Self Care | Source: Ambulatory Visit | Attending: Urgent Care | Admitting: Urgent Care

## 2023-05-02 ENCOUNTER — Ambulatory Visit (INDEPENDENT_AMBULATORY_CARE_PROVIDER_SITE_OTHER): Admitting: Urgent Care

## 2023-05-02 VITALS — BP 125/75 | HR 60 | Wt 177.0 lb

## 2023-05-02 DIAGNOSIS — Z1159 Encounter for screening for other viral diseases: Secondary | ICD-10-CM

## 2023-05-02 DIAGNOSIS — Z113 Encounter for screening for infections with a predominantly sexual mode of transmission: Secondary | ICD-10-CM | POA: Insufficient documentation

## 2023-05-02 DIAGNOSIS — Z8619 Personal history of other infectious and parasitic diseases: Secondary | ICD-10-CM

## 2023-05-02 DIAGNOSIS — A6000 Herpesviral infection of urogenital system, unspecified: Secondary | ICD-10-CM | POA: Diagnosis not present

## 2023-05-02 MED ORDER — VALACYCLOVIR HCL 1 G PO TABS: 1000.0000 mg | ORAL_TABLET | Freq: Every day | ORAL | 3 refills | Status: DC

## 2023-05-02 NOTE — Progress Notes (Signed)
 Established Patient Office Visit  Subjective:  Patient ID: Thomas Valdez, male    DOB: [birth year]  Age: 28 y.o. MRN: [id number]  Chief Complaint  Patient presents with   Follow-up    Pt is here today for STD testing. He has not had an exposure but would like to have some testing done.    HPI   Discussed the use of AI scribe software for clinical note transcription with the patient, who gave verbal consent to proceed.  History of Present Illness   Thomas Valdez is a 28 year old male who presents for routine STI screening and evaluation of recurrent herpes flare-ups.  He desires routine STI screening despite having no current symptoms. His last testing was in June of the previous year. He tested positive for syphilis in 2015, which was treated, and his last syphilis titer was 1:1, indicating it was not active. He has never tested positive for HIV, with his last test being negative.  He experiences recurrent herpes flare-ups approximately every two and a half weeks, more frequent than his previous experience of twice a year. He attributes this increase to stress related to surgery and work from the previous year. He uses Valtrex  as needed for these flare-ups.  No urinary symptoms such as burning or rashes. He has not experienced any other symptoms that would suggest an active infection.  He is not on any immunomodulating medications and has no other prescriptions aside from Valtrex . He works at the [employer] hospital in [place].       Patient Active Problem List   Diagnosis Date Noted   Recurrent genital HSV (herpes simplex virus) infection 12/02/2022   History of colonic diverticulitis 12/02/2022   Status post partial resection of colon 12/02/2022   Diverticulitis large intestine 11/19/2022   Poor appetite 09/07/2022   Diverticulitis 09/05/2022   Abnormal CT scan, colon 08/04/2022   Diverticulitis of large intestine with complication 06/26/2022   Diverticulitis of colon  with perforation 06/26/2022   Abdominal pain 06/25/2022   History of sexually transmitted disease 06/25/2022   Colonic diverticular abscess 08/10/2021   Humeral head fracture 06/11/2021   Establishing care with new doctor, encounter for 01/21/2021   Annual physical exam 01/21/2021   Past Medical History:  Diagnosis Date   Diverticulitis    Syphilis    per patient treated at age 28   Past Surgical History:  Procedure Laterality Date   COLONOSCOPY WITH PROPOFOL  N/A 08/23/2022   Procedure: COLONOSCOPY WITH PROPOFOL ;  Surgeon: Suzette Espy, MD;  [facility];  Service: Endoscopy;  Laterality: N/A;  1:15 pm, asa 1   none     POLYPECTOMY  08/23/2022   Procedure: POLYPECTOMY;  Surgeon: Suzette Espy, MD;  [facility];  Service: Endoscopy;;   Social History   Tobacco Use   Smoking status: Never   Smokeless tobacco: Never  Vaping Use   Vaping status: Never Used  Substance Use Topics   Alcohol use: Yes    Comment: social   Drug use: Yes    Types: Marijuana      ROS: as noted in HPI  Objective:     BP 125/75   Pulse 60   Wt 177 lb (80.3 kg)   SpO2 98%   BMI 25.40 kg/m  BP Readings from Last 3 Encounters:  05/02/23 125/75  12/21/22 112/74  12/08/22 109/72   Wt Readings from Last 3 Encounters:  05/02/23 177 lb (80.3 kg)  12/21/22 174 lb (  78.9 kg)  12/08/22 173 lb (78.5 kg)      Physical Exam Vitals and nursing note reviewed.  Constitutional:      General: He is not in acute distress.    Appearance: Normal appearance. He is not ill-appearing, toxic-appearing or diaphoretic.  HENT:     Head: Normocephalic and atraumatic.     Right Ear: Tympanic membrane, ear canal and external ear normal. There is no impacted cerumen.     Left Ear: Tympanic membrane, ear canal and external ear normal. There is no impacted cerumen.     Nose: Nose normal. No congestion or rhinorrhea.     Mouth/Throat:     Mouth: Mucous membranes are moist.     Pharynx:  Oropharynx is clear. No oropharyngeal exudate or posterior oropharyngeal erythema.  Eyes:     General: No scleral icterus.    Pupils: Pupils are equal, round, and reactive to light.  Cardiovascular:     Rate and Rhythm: Normal rate and regular rhythm.     Heart sounds: No murmur heard.    No friction rub. No gallop.  Pulmonary:     Effort: Pulmonary effort is normal. No respiratory distress.     Breath sounds: Normal breath sounds. No stridor. No wheezing, rhonchi or rales.  Musculoskeletal:     Cervical back: Normal range of motion and neck supple. No rigidity or tenderness.  Lymphadenopathy:     Cervical: No cervical adenopathy.  Skin:    General: Skin is warm and dry.     Findings: No erythema or rash.  Neurological:     General: No focal deficit present.     Mental Status: He is alert and oriented to person, place, and time.      No results found for any visits on 05/02/23.  Last CBC Lab Results  Component Value Date   WBC 6.5 11/21/2022   HGB 12.3 (L) 11/21/2022   HCT 37.4 (L) 11/21/2022   MCV 92.1 11/21/2022   MCH 30.3 11/21/2022   RDW 12.7 11/21/2022   PLT 374 11/21/2022   Last metabolic panel Lab Results  Component Value Date   GLUCOSE 116 (H) 11/20/2022   NA 135 11/20/2022   K 4.0 11/20/2022   CL 101 11/20/2022   CO2 28 11/20/2022   BUN 9 11/20/2022   CREATININE 1.04 11/20/2022   GFRNONAA >60 11/20/2022   CALCIUM 8.4 (L) 11/20/2022   PHOS 4.9 (H) 08/13/2021   PROT 8.6 (H) 11/12/2022   ALBUMIN 4.5 11/12/2022   LABGLOB 2.8 01/21/2021   AGRATIO 1.8 01/21/2021   BILITOT 0.9 11/12/2022   ALKPHOS 70 11/12/2022   AST 20 11/12/2022   ALT 18 11/12/2022   ANIONGAP 6 11/20/2022      The ASCVD Risk score (Arnett DK, et al., 2019) failed to calculate for the following reasons:   The 2019 ASCVD risk score is only valid for ages 17 to 23  Assessment & Plan:  Recurrent genital HSV (herpes simplex virus) infection -     valACYclovir  HCl; Take 1 tablet  (1,000 mg total) by mouth daily.  Dispense: 90 tablet; Refill: 3  History of syphilis -     RPR  Need for hepatitis C screening test -     Hepatitis C antibody  Screen for STD (sexually transmitted disease) -     RPR -     Urine cytology ancillary only -     HIV Antibody (routine testing w rflx) -     Hepatitis C  antibody  Assessment and Plan    Herpes Simplex Virus (HSV) Frequent HSV flare-ups every 2.5 weeks, likely triggered by stress. Daily suppressive therapy recommended. - Prescribe valacyclovir  1000 mg daily for HSV suppression. - Advise to contact via MyChart if issues persist despite daily medication.  Sexually Transmitted Infection Screening Routine STI screening requested. History of syphilis with inactive status. Discussed syphilis monitoring and hepatitis C screening due to potential sexual transmission. - Order blood work for HIV, syphilis, and hepatitis C screening. - Perform urine test for gonorrhea and chlamydia.  General Health Maintenance No current symptoms or concerns. Blood pressure normal. Routine exams and screenings discussed. - Schedule routine annual exam and blood work in November.         Return in about 7 months (around 12/02/2023) for Annual Physical.   Mandy Second, PA

## 2023-05-02 NOTE — Patient Instructions (Signed)
 Please start taking 1000mg  (1g) valtrex  daily. This will help suppress any flares. I have called in 90 days with 3 refills.  Please schedule your annual Physical with fasting labs around Nov 2025

## 2023-05-03 ENCOUNTER — Encounter: Payer: Self-pay | Admitting: Urgent Care

## 2023-05-03 DIAGNOSIS — A530 Latent syphilis, unspecified as early or late: Secondary | ICD-10-CM

## 2023-05-03 DIAGNOSIS — Z8619 Personal history of other infectious and parasitic diseases: Secondary | ICD-10-CM

## 2023-05-03 LAB — URINE CYTOLOGY ANCILLARY ONLY
Chlamydia: NEGATIVE
Comment: NEGATIVE
Comment: NEGATIVE
Comment: NORMAL
Neisseria Gonorrhea: NEGATIVE
Trichomonas: NEGATIVE

## 2023-05-04 LAB — HEPATITIS C ANTIBODY: Hepatitis C Ab: NONREACTIVE

## 2023-05-04 LAB — T PALLIDUM AB: T Pallidum Abs: POSITIVE — AB

## 2023-05-04 LAB — RPR: RPR Ser Ql: REACTIVE — AB

## 2023-05-04 LAB — RPR TITER: RPR Titer: 1:2 {titer} — ABNORMAL HIGH

## 2023-05-04 LAB — HIV ANTIBODY (ROUTINE TESTING W REFLEX): HIV 1&2 Ab, 4th Generation: NONREACTIVE

## 2023-05-13 ENCOUNTER — Encounter: Payer: Self-pay | Admitting: Radiology

## 2023-05-31 ENCOUNTER — Other Ambulatory Visit: Payer: Self-pay | Admitting: Urgent Care

## 2023-05-31 DIAGNOSIS — Z8619 Personal history of other infectious and parasitic diseases: Secondary | ICD-10-CM | POA: Diagnosis not present

## 2023-05-31 DIAGNOSIS — A530 Latent syphilis, unspecified as early or late: Secondary | ICD-10-CM | POA: Diagnosis not present

## 2023-06-01 ENCOUNTER — Ambulatory Visit: Payer: Self-pay | Admitting: Urgent Care

## 2023-06-01 LAB — RPR, QUANT: RPR, Quant: 1:1 {titer} — ABNORMAL HIGH

## 2023-06-01 LAB — RPR W/REFLEX TO TREPSURE: RPR: REACTIVE — AB

## 2023-08-15 ENCOUNTER — Encounter: Payer: Self-pay | Admitting: Urgent Care

## 2023-08-16 NOTE — Telephone Encounter (Signed)
 Would you please assist this patient In scheduling with Benton Gave this week ?

## 2023-09-14 ENCOUNTER — Ambulatory Visit
Admission: EM | Admit: 2023-09-14 | Discharge: 2023-09-14 | Disposition: A | Discharge status: Home / Self Care | Attending: Family Medicine | Admitting: Family Medicine

## 2023-09-14 ENCOUNTER — Encounter: Payer: Self-pay | Admitting: Emergency Medicine

## 2023-09-14 ENCOUNTER — Ambulatory Visit (INDEPENDENT_AMBULATORY_CARE_PROVIDER_SITE_OTHER)

## 2023-09-14 ENCOUNTER — Ambulatory Visit: Payer: Self-pay | Admitting: Family Medicine

## 2023-09-14 DIAGNOSIS — M25562 Pain in left knee: Secondary | ICD-10-CM

## 2023-09-14 MED ORDER — NAPROXEN 500 MG PO TABS: 500.0000 mg | ORAL_TABLET | Freq: Two times a day (BID) | ORAL | 0 refills | Status: DC

## 2023-09-14 MED ORDER — IBUPROFEN 800 MG PO TABS
800.0000 mg | ORAL_TABLET | Freq: Once | ORAL | Status: AC
  Administered 2023-09-14: 800 mg via ORAL

## 2023-09-14 NOTE — Discharge Instructions (Addendum)
 On my review of your xray images, you did not have any fractures or dislocated bones. The radiologist has not yet read your xray. If it is significantly abnormal or urgent, someone will contact you.  You should see your results in MyChart.   Follow up with an orthopedic specialist for ongoing knee pain. I suspect you may have some patella tendonitis vs patellofemoral pain syndrome which is common is active young adults.  See handout for more information. Stop by the pharmacy to pick up your prescriptions.

## 2023-09-14 NOTE — ED Triage Notes (Signed)
 Pt presents with left knee pain x 1 week. The pain feels like something is sticking him behind his knee cap. He denies any injury. He has tried OTC pain medication with no relief.

## 2023-09-14 NOTE — ED Provider Notes (Signed)
 [facility]    CSN: [id number] Arrival date & time: 09/14/23  1537      History   Chief Complaint Chief Complaint  Patient presents with   Knee Pain    HPI  HPI Thomas Valdez is a 28 y.o. male.   Thomas Valdez presents for left knee pain around his kneecap for the past week. Has stiffness and aching constant knee pain. No falls, trauma or known injury. He says he was laying in bed and stretched his leg and said ouch.  The next morning he had pain in his knee. No history of similar knee pain.  Tried Tylenol  and ibuprofen  without relief.  Pain worse while moving a certain way.  Lifting his leg up makes the pain worse.  He runs 4 miles once a week and the other days he walks a lot.     Past Medical History:  Diagnosis Date   Diverticulitis    Syphilis    per patient treated at age 60    Patient Active Problem List   Diagnosis Date Noted   Recurrent genital HSV (herpes simplex virus) infection 12/02/2022   History of colonic diverticulitis 12/02/2022   Status post partial resection of colon 12/02/2022   Diverticulitis large intestine 11/19/2022   Poor appetite 09/07/2022   Diverticulitis 09/05/2022   Abnormal CT scan, colon 08/04/2022   Diverticulitis of large intestine with complication 06/26/2022   Diverticulitis of colon with perforation 06/26/2022   Abdominal pain 06/25/2022   History of sexually transmitted disease 06/25/2022   Colonic diverticular abscess 08/10/2021   Humeral head fracture 06/11/2021   Establishing care with new doctor, encounter for 01/21/2021   Annual physical exam 01/21/2021    Past Surgical History:  Procedure Laterality Date   COLONOSCOPY WITH PROPOFOL  N/A 08/23/2022   Procedure: COLONOSCOPY WITH PROPOFOL ;  Surgeon: Shaaron Lamar HERO, MD;  [facility];  Service: Endoscopy;  Laterality: N/A;  1:15 pm, asa 1   none     POLYPECTOMY  08/23/2022   Procedure: POLYPECTOMY;  Surgeon: Shaaron Lamar HERO, MD;  [facility];  Service: Endoscopy;;       Home Medications    Prior to Admission medications   Medication Sig Start Date End Date Taking? Authorizing Provider  valACYclovir  (VALTREX ) 1000 MG tablet Take 1 tablet (1,000 mg total) by mouth daily. 05/02/23   Lowella Benton CROME, PA    Family History Family History  Problem Relation Age of Onset   Cancer Father        prostate   Heart disease Maternal Grandmother    Inflammatory bowel disease Neg Hx    Colon cancer Neg Hx     Social History Social History   Tobacco Use   Smoking status: Never   Smokeless tobacco: Never  Vaping Use   Vaping status: Never Used  Substance Use Topics   Alcohol use: Not Currently    Comment: social   Drug use: Not Currently    Types: Marijuana     Allergies   Patient has no known allergies.   Review of Systems Review of Systems: :negative unless otherwise stated in HPI.      Physical Exam Triage Vital Signs ED Triage Vitals  Encounter Vitals Group     BP 09/14/23 1553 118/80     Girls Systolic BP Percentile --      Girls Diastolic BP Percentile --      Boys Systolic BP Percentile --  Boys Diastolic BP Percentile --      Pulse Rate 09/14/23 1553 69     Resp 09/14/23 1553 16     Temp 09/14/23 1553 98.5 F (36.9 C)     Temp src --      SpO2 09/14/23 1553 98 %     Weight 09/14/23 1552 187 lb (84.8 kg)     Height --      Head Circumference --      Peak Flow --      Pain Score 09/14/23 1552 7     Pain Loc --      Pain Education --      Exclude from Growth Chart --    No data found.  Updated Vital Signs BP 118/80 (BP Location: Left Arm)   Pulse 69   Temp 98.5 F (36.9 C)   Resp 16   Wt 84.8 kg   SpO2 98%   BMI 26.83 kg/m   Visual Acuity Right Eye Distance:   Left Eye Distance:   Bilateral Distance:    Right Eye Near:   Left Eye Near:    Bilateral Near:     Physical Exam GEN: well appearing male in no acute distress  CVS: well perfused  RESP: speaking in full  sentences without pause, no respiratory distress  MSK:   *** shoulder:  No evidence of bony deformity, asymmetry, or muscle atrophy. No tenderness over long head of biceps (bicipital groove).  No TTP at [facility] joint.  Full active and passive (ABD, ADD, Flexion, extension, IR, ER). Limited *** 2/2 to pain.  Strength 5/5 grip, elbow and shoulder. No abnormal scapular function observed.  Special Tests: Vonzell: ***; Empty Can: ***, Neer's: Negative; Painful arc: Negative; Anterior Apprehension: Negative Sensation intact. Peripheral pulses intact.   UC Treatments / Results  Labs (all labs ordered are listed, but only abnormal results are displayed) Labs Reviewed - No data to display  EKG   Radiology No results found.   Procedures Procedures (including critical care time)  Medications Ordered in UC Medications - No data to display  Initial Impression / Assessment and Plan / UC Course  I have reviewed the triage vital signs and the nursing notes.  Pertinent labs & imaging results that were available during my care of the patient were reviewed by me and considered in my medical decision making (see chart for details).      Pt is a 28 y.o.  male with *** days of *** shoulder pain after ***.   On exam, pt has tenderness at *** concerning for ***.   Obtained *** shoulder plain films.  Personally interpreted by me were ***unremarkable for fracture or dislocation. Radiologist report reviewed and additionally notes *** no soft tissue swelling.  Given ***Toradol  IM/sling/brace/crutches  Patient to gradually return to normal activities, as tolerated and continue ordinary activities within the limits permitted by pain. Prescribed Naproxen  sodium *** and muscle relaxer *** for pain relief.  Tylenol  PRN. Advised patient to avoid OTC NSAIDs while taking prescription NSAID. Counseled patient on red flag symptoms and when to seek immediate care.  ***No red flags such as progressive major motor  weakness.   Patient to follow up with orthopedic provider, if symptoms do not improve with conservative treatment.  Return and ED precautions given. Understanding voiced. Discussed MDM, treatment plan and plan for follow-up with patient/parent who agrees with plan.   Final Clinical Impressions(s) / UC Diagnoses   Final diagnoses:  None   Discharge  Instructions   None    ED Prescriptions   None    PDMP not reviewed this encounter.

## 2023-09-21 ENCOUNTER — Encounter: Payer: Self-pay | Admitting: Orthopedic Surgery

## 2023-09-21 ENCOUNTER — Other Ambulatory Visit: Payer: Self-pay

## 2023-09-21 ENCOUNTER — Ambulatory Visit: Admitting: Orthopedic Surgery

## 2023-09-21 DIAGNOSIS — M65962 Unspecified synovitis and tenosynovitis, left lower leg: Secondary | ICD-10-CM

## 2023-09-21 DIAGNOSIS — M25562 Pain in left knee: Secondary | ICD-10-CM

## 2023-09-21 MED ORDER — LIDOCAINE HCL 1 % IJ SOLN
5.0000 mL | INTRAMUSCULAR | Status: AC | PRN
  Administered 2023-09-21: 5 mL

## 2023-09-21 MED ORDER — TRIAMCINOLONE ACETONIDE 40 MG/ML IJ SUSP
40.0000 mg | INTRAMUSCULAR | Status: AC | PRN
  Administered 2023-09-21: 40 mg via INTRA_ARTICULAR

## 2023-09-21 MED ORDER — BUPIVACAINE HCL 0.25 % IJ SOLN
4.0000 mL | INTRAMUSCULAR | Status: AC | PRN
  Administered 2023-09-21: 4 mL via INTRA_ARTICULAR

## 2023-09-21 NOTE — Progress Notes (Signed)
 Office Visit Note   Patient: Thomas Valdez           Date of Birth: 1995/12/01           MRN: [id number] Visit Date: 09/21/2023 Requested by: Lowella Benton CROME, PA [address], [address] [place],  [place] [address] PCP: Lowella Benton CROME, [place]  Subjective: Chief Complaint  Patient presents with   Left Knee - Pain    HPI: Thomas Valdez is a 28 y.o. male who presents to the office reporting left knee pain.  Started about 6 weeks ago.  Worse over the past 2 weeks.  Denies any history of injury.  Pain primarily anterior.  Tried ibuprofen  and Tylenol .  Has also tried stretching exercises.  Pain comes and goes but definitely worse with stairs and worse at night.  Describes stiffness and weakness.  He does do a lot of standing and walking where he works at the [employer].  He is studying to get into nursing school..                ROS: All systems reviewed are negative as they relate to the chief complaint within the history of present illness.  Patient denies fevers or chills.  Assessment & Plan: Visit Diagnoses:  1. Left knee pain, unspecified chronicity     Plan: Impression is significant anterior knee pain.  6 weeks duration.  Cortisone injection performed today.  Has good flexibility.  MRI indicated to evaluate anterior knee pain.  Follow-up after that study.  Follow-Up Instructions: No follow-ups on file.   Orders:  Orders Placed This Encounter  Procedures   MR Knee Left w/o contrast   No orders of the defined types were placed in this encounter.     Procedures: Large Joint Inj: L knee on 09/21/2023 6:33 PM Indications: diagnostic evaluation, joint swelling and pain Details: 18 G 1.5 in needle, superolateral approach  Arthrogram: No  Medications: 5 mL lidocaine  1 %; 4 mL bupivacaine  0.25 %; 40 mg triamcinolone  acetonide 40 MG/ML Outcome: tolerated well, no immediate complications Procedure, treatment alternatives, risks and benefits explained, specific  risks discussed. Consent was given by the patient. Immediately prior to procedure a time out was called to verify the correct patient, procedure, equipment, support staff and site/side marked as required. Patient was prepped and draped in the usual sterile fashion.       Clinical Data: No additional findings.  Objective: Vital Signs: There were no vitals taken for this visit.  Physical Exam:  Constitutional: Patient appears well-developed HEENT:  Head: Normocephalic Eyes:EOM are normal Neck: Normal range of motion Cardiovascular: Normal rate Pulmonary/chest: Effort normal Neurologic: Patient is alert Skin: Skin is warm Psychiatric: Patient has normal mood and affect  Ortho Exam: Ortho exam demonstrates fair amount of patellofemoral crepitus on the left compared to the right.  No effusion left knee versus right knee.  Collateral crucial ligaments are stable.  Negative patellar apprehension.  No malalignment lower extremities.  Pedal pulses palpable.  No other masses lymphadenopathy or skin changes noted in that left knee region.  Specialty Comments:  No specialty comments available.  Imaging: No results found.   PMFS History: Patient Active Problem List   Diagnosis Date Noted   Recurrent genital HSV (herpes simplex virus) infection 12/02/2022   History of colonic diverticulitis 12/02/2022   Status post partial resection of colon 12/02/2022   Diverticulitis large intestine 11/19/2022   Poor appetite 09/07/2022   Diverticulitis 09/05/2022  Abnormal CT scan, colon 08/04/2022   Diverticulitis of large intestine with complication 06/26/2022   Diverticulitis of colon with perforation 06/26/2022   Abdominal pain 06/25/2022   History of sexually transmitted disease 06/25/2022   Colonic diverticular abscess 08/10/2021   Humeral head fracture 06/11/2021   Establishing care with new doctor, encounter for 01/21/2021   Annual physical exam 01/21/2021   Past Medical History:   Diagnosis Date   Diverticulitis    Syphilis    per patient treated at age 36    Family History  Problem Relation Age of Onset   Cancer Father        prostate   Heart disease Maternal Grandmother    Inflammatory bowel disease Neg Hx    Colon cancer Neg Hx     Past Surgical History:  Procedure Laterality Date   COLONOSCOPY WITH PROPOFOL  N/A 08/23/2022   Procedure: COLONOSCOPY WITH PROPOFOL ;  Surgeon: Shaaron Lamar HERO, MD;  [facility];  Service: Endoscopy;  Laterality: N/A;  1:15 pm, asa 1   none     POLYPECTOMY  08/23/2022   Procedure: POLYPECTOMY;  Surgeon: Shaaron Lamar HERO, MD;  [facility]: [facility];  Service: Endoscopy;;   Social History   Occupational History   Not on file  Tobacco Use   Smoking status: Never   Smokeless tobacco: Never  Vaping Use   Vaping status: Never Used  Substance and Sexual Activity   Alcohol use: Not Currently    Comment: social   Drug use: Not Currently    Types: Marijuana   Sexual activity: Yes    Partners: Female, Male

## 2023-09-25 ENCOUNTER — Ambulatory Visit: Admission: RE | Admit: 2023-09-25 | Source: Ambulatory Visit

## 2023-10-10 ENCOUNTER — Ambulatory Visit
Admission: RE | Admit: 2023-10-10 | Discharge: 2023-10-10 | Disposition: A | Discharge status: Home / Self Care | Source: Ambulatory Visit | Attending: Orthopedic Surgery | Admitting: Orthopedic Surgery

## 2023-10-10 ENCOUNTER — Ambulatory Visit: Admitting: Family Medicine

## 2023-10-10 ENCOUNTER — Encounter: Payer: Self-pay | Admitting: Family Medicine

## 2023-10-10 VITALS — BP 121/73 | HR 93 | Temp 99.3°F | Resp 20 | Ht 70.0 in | Wt 183.0 lb

## 2023-10-10 DIAGNOSIS — Z202 Contact with and (suspected) exposure to infections with a predominantly sexual mode of transmission: Secondary | ICD-10-CM | POA: Diagnosis not present

## 2023-10-10 DIAGNOSIS — M25562 Pain in left knee: Secondary | ICD-10-CM | POA: Diagnosis not present

## 2023-10-10 DIAGNOSIS — D649 Anemia, unspecified: Secondary | ICD-10-CM | POA: Insufficient documentation

## 2023-10-10 DIAGNOSIS — M25462 Effusion, left knee: Secondary | ICD-10-CM | POA: Diagnosis not present

## 2023-10-10 DIAGNOSIS — D619 Aplastic anemia, unspecified: Secondary | ICD-10-CM | POA: Insufficient documentation

## 2023-10-10 DIAGNOSIS — D509 Iron deficiency anemia, unspecified: Secondary | ICD-10-CM | POA: Diagnosis not present

## 2023-10-10 DIAGNOSIS — A6000 Herpesviral infection of urogenital system, unspecified: Secondary | ICD-10-CM | POA: Diagnosis not present

## 2023-10-10 DIAGNOSIS — Z0000 Encounter for general adult medical examination without abnormal findings: Secondary | ICD-10-CM

## 2023-10-10 DIAGNOSIS — Z8619 Personal history of other infectious and parasitic diseases: Secondary | ICD-10-CM

## 2023-10-10 DIAGNOSIS — Z7689 Persons encountering health services in other specified circumstances: Secondary | ICD-10-CM

## 2023-10-10 NOTE — Assessment & Plan Note (Signed)
 Has a history of syphilis, will check RPR, hep C and HIV.  Will also check GC, chlamydia and trichomoniasis through urine sample

## 2023-10-10 NOTE — Assessment & Plan Note (Signed)
 No indices available.  Will check iron indices along with CBC.

## 2023-10-10 NOTE — Assessment & Plan Note (Signed)
 Now that he takes his Valtrex  suppression every day he no longer breaks out.

## 2023-10-10 NOTE — Assessment & Plan Note (Signed)
 Ask him to get scheduled for a physical and to get his surveillance labs.

## 2023-10-10 NOTE — Progress Notes (Signed)
 New Patient Office Visit  Subjective    Patient ID: Thomas Valdez, male    DOB: 1995-09-01  Age: 28 y.o. MRN: [id number]  CC:  Chief Complaint  Patient presents with   Establish Care    HPI Thomas Valdez presents to establish care. Delightful 28 year old gentleman with genital HSV, Hx colonic diverticular abscess and s/p partial colon resection 11/19/2022, Hx syphilis, Hx of anemia and painful left knee. He has had a steroid injection in his knee that did not help very much.  He is scheduled for an MRI of his knee later today.  His knee hurts when he has to climb stairs but does not lock or give out.   States that he takes his Valtrex  every day.  Prior to taking the Valtrex  every day he was having outbreaks twice a month probably related to stress.  Since being on Valtrex  suppression daily he has not had an outbreak. He is typically very active lifting weights in the gym and running. 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     He is bisexual.    Outpatient Encounter Medications as of 10/10/2023  Medication Sig   naproxen  (NAPROSYN ) 500 MG tablet Take 1 tablet (500 mg total) by mouth 2 (two) times daily.   valACYclovir  (VALTREX ) 1000 MG tablet Take 1 tablet (1,000 mg total) by mouth daily.   No facility-administered encounter medications on file as of 10/10/2023.    Past Medical History:  Diagnosis Date   Diverticulitis    Syphilis    per patient treated at age 25    Past Surgical History:  Procedure Laterality Date   COLONOSCOPY WITH PROPOFOL  N/A 08/23/2022   Procedure: COLONOSCOPY WITH PROPOFOL ;  Surgeon: Shaaron Lamar HERO, MD;  [facility];  Service: Endoscopy;  Laterality: N/A;  1:15 pm, asa 1   none     POLYPECTOMY  08/23/2022   Procedure: POLYPECTOMY;  Surgeon: Shaaron Lamar HERO, MD;  [facility];  Service: Endoscopy;;    Family History  Problem Relation Age of Onset   Cancer Father        prostate   Heart  disease Maternal Grandmother    Inflammatory bowel disease Neg Hx    Colon cancer Neg Hx     Social History   Socioeconomic History   Marital status: Single    Spouse name: Not on file   Number of children: Not on file   Years of education: Not on file   Highest education level: Associate degree: occupational, Scientist, product/process development, or vocational program  Occupational History   Not on file  Tobacco Use   Smoking status: Some Days    Types: Cigarettes    Passive exposure: Past   Smokeless tobacco: Never  Vaping Use   Vaping status: Never Used  Substance and Sexual Activity   Alcohol use: Not Currently    Comment: social   Drug use: Not Currently    Types: Marijuana   Sexual activity: Yes    Partners: Female, Male  Other Topics Concern   Not on file  Social History Narrative   Not on file   Social Drivers of Health   Financial Resource Strain: Low Risk  (05/02/2023)   Overall Financial Resource Strain (CARDIA)    Difficulty of Paying Living Expenses: Not hard at all  Food Insecurity: No Food Insecurity (05/02/2023)   Hunger Vital Sign    Worried About Running Out of Food in  the Last Year: Never true    Ran Out of Food in the Last Year: Never true  Transportation Needs: No Transportation Needs (05/02/2023)   PRAPARE - Administrator, Civil Service (Medical): No    Lack of Transportation (Non-Medical): No  Physical Activity: Sufficiently Active (05/02/2023)   Exercise Vital Sign    Days of Exercise per Week: 5 days    Minutes of Exercise per Session: 60 min  Stress: No Stress Concern Present (05/02/2023)   [employer] of Occupational Health - Occupational Stress Questionnaire    Feeling of Stress : Not at all  Social Connections: Unknown (05/02/2023)   Social Connection and Isolation Panel    Frequency of Communication with Friends and Family: More than three times a week    Frequency of Social Gatherings with Friends and Family: Once a week    Attends Religious  Services: Patient declined    Database administrator or Organizations: No    Attends Engineer, structural: Not on file    Marital Status: Patient declined  Intimate Partner Violence: Not At Risk (11/22/2022)   Humiliation, Afraid, Rape, and Kick questionnaire    Fear of Current or Ex-Partner: No    Emotionally Abused: No    Physically Abused: No    Sexually Abused: No    ROS      Objective   BP 121/73 (BP Location: Left Arm, Patient Position: Sitting, Cuff Size: Normal)   Pulse 93   Temp 99.3 F (37.4 C) (Oral)   Resp 20   Ht 5' 10 (1.778 m)   Wt 183 lb (83 kg)   SpO2 96%   BMI 26.26 kg/m    Physical Exam Vitals and nursing note reviewed.  Constitutional:      Appearance: Normal appearance.  HENT:     Head: Normocephalic and atraumatic.  Eyes:     Conjunctiva/sclera: Conjunctivae normal.  Cardiovascular:     Rate and Rhythm: Normal rate and regular rhythm.  Pulmonary:     Effort: Pulmonary effort is normal.     Breath sounds: Normal breath sounds.  Musculoskeletal:     Right lower leg: No edema.     Left lower leg: No edema.  Skin:    General: Skin is warm and dry.  Neurological:     Mental Status: He is alert and oriented to person, place, and time.  Psychiatric:        Mood and Affect: Mood normal.        Behavior: Behavior normal.        Thought Content: Thought content normal.        Judgment: Judgment normal.            The ASCVD Risk score (Arnett DK, et al., 2019) failed to calculate for the following reasons:   The 2019 ASCVD risk score is only valid for ages 17 to 43     Assessment & Plan:  Establishing care with new doctor, encounter for  History of sexually transmitted disease Assessment & Plan: Has a history of syphilis, will check RPR, hep C and HIV.  Will also check GC, chlamydia and trichomoniasis through urine sample   Possible exposure to STI -     RPR; Future -     Hepatitis C antibody; Future -     Ct/GC/Tv  NAA+Mycoplasmas Urine; Future -     HIV Antibody (routine testing w rflx); Future  Iron deficiency anemia, unspecified iron deficiency anemia type  Assessment & Plan: No indices available.  Will check iron indices along with CBC.  Orders: -     CBC with Differential/Platelet; Future -     Iron, TIBC and Ferritin Panel; Future  Wellness examination Assessment & Plan: Ask him to get scheduled for a physical and to get his surveillance labs.  Orders: -     CMP14+EGFR; Future -     Lipid panel; Future -     TSH + free T4; Future  Recurrent genital HSV (herpes simplex virus) infection Assessment & Plan: Now that he takes his Valtrex  suppression every day he no longer breaks out.     Return in about 2 weeks (around 10/24/2023) for physical exam with labs one week prior.   Liel Rudden K Yaretzi Ernandez, MD

## 2023-10-20 ENCOUNTER — Encounter: Admitting: Family Medicine

## 2023-10-24 ENCOUNTER — Ambulatory Visit: Payer: Self-pay

## 2023-10-24 ENCOUNTER — Encounter

## 2023-10-24 NOTE — Telephone Encounter (Signed)
  FYI Only or Action Required?: FYI only for provider.  Patient was last seen in primary care on 10/10/2023 by Ziglar, Susan K, MD.  Called Nurse Triage reporting genital sores.  Symptoms began a week ago.  Interventions attempted: Prescription medications: patient takes valtrex  daily.  Symptoms are: gradually worsening.  Triage Disposition: See Physician Within 24 Hours  Patient/caregiver understands and will follow disposition?: Yes Copied from CRM #[id number]. Topic: Clinical - Red Word Triage >> Oct 24, 2023 10:53 AM Mia F wrote: Red Word that prompted transfer to Nurse Triage: Skin tag/bump in groin area that is painful and discomfortable. Seems to be moist with fluid seeping. Has HSV 2 and says this is a new outbreak in the area where this is at. Reason for Disposition  [1] Multiple small blisters grouped together in one area of body (i.e., dermatomal distribution or band or stripe) AND [2] painful  Answer Assessment - Initial Assessment Questions 1. APPEARANCE of SORES: What do the sores look like?     One large sore, beginning to cluster 2. NUMBER: How many sores are there?     One sore 3. SIZE: How big is the largest sore?     Head of an eraser 4. LOCATION: Where are the sores located?     A few inches under anus, almost to groin 5. ONSET: When did the sores begin?     One week ago 6. TENDER: Does it hurt when you touch it?  (Scale 1-10; or mild, moderate, severe)      Tender to touch 7. CAUSE: What do you think is causing the sores?     HSV 8. OTHER SYMPTOMS: Do you have any other symptoms? (e.g., fever, new weakness)     Red, burning, warm and tender to touch, weeping clear fluid  Protocols used: Sores-A-AH

## 2023-10-25 ENCOUNTER — Ambulatory Visit: Admitting: Family Medicine

## 2023-10-25 VITALS — BP 109/74 | HR 82 | Resp 16 | Ht 70.0 in | Wt 184.0 lb

## 2023-10-25 DIAGNOSIS — A530 Latent syphilis, unspecified as early or late: Secondary | ICD-10-CM

## 2023-10-25 DIAGNOSIS — A6000 Herpesviral infection of urogenital system, unspecified: Secondary | ICD-10-CM

## 2023-10-25 DIAGNOSIS — Z8619 Personal history of other infectious and parasitic diseases: Secondary | ICD-10-CM

## 2023-10-25 DIAGNOSIS — D509 Iron deficiency anemia, unspecified: Secondary | ICD-10-CM | POA: Diagnosis not present

## 2023-10-25 DIAGNOSIS — K6289 Other specified diseases of anus and rectum: Secondary | ICD-10-CM | POA: Diagnosis not present

## 2023-10-25 DIAGNOSIS — Z202 Contact with and (suspected) exposure to infections with a predominantly sexual mode of transmission: Secondary | ICD-10-CM

## 2023-10-25 DIAGNOSIS — Z0000 Encounter for general adult medical examination without abnormal findings: Secondary | ICD-10-CM

## 2023-10-25 MED ORDER — VALACYCLOVIR HCL 1 G PO TABS: 1000.0000 mg | ORAL_TABLET | Freq: Two times a day (BID) | ORAL | 3 refills | Status: AC

## 2023-10-25 NOTE — Patient Instructions (Addendum)
 Valtrex  1000mg  BID (12 hours apart) for acute outbreaks 7-10 days; then return to taking Valtrex  1000mg  daily    MyChart:  For all urgent or time sensitive needs we ask that you please call the office to avoid delays. Our number is [phone]) [phone]. MyChart is not constantly monitored and due to the large volume of messages a day, replies may take up to 72 business hours.   MyChart Policy: MyChart allows for you to see your visit notes, after visit summary, provider recommendations, lab and tests results, make an appointment, request refills, and contact your provider or the office for non-urgent questions or concerns. Providers are seeing patients during normal business hours and do not have built in time to review MyChart messages.  We ask that you allow a minimum of 3 business days for responses to [employer]. For this reason, please do not send urgent requests through MyChart. Please call the office at [phone]. New and ongoing conditions may require a visit. We have virtual and in person visit available for your convenience.  Complex MyChart concerns may require a visit. Your provider may request you schedule a virtual or in person visit to ensure we are providing the best care possible. MyChart messages sent after 11:00 AM on Friday will not be received by the provider until Monday morning.    Lab and Test Results: You will receive your lab and test results on MyChart as soon as they are completed and results have been sent by the lab or testing facility. Due to this service, you will receive your results BEFORE your provider.  I review lab and tests results each morning prior to seeing patients. Some results require collaboration with other providers to ensure you are receiving the most appropriate care. For this reason, we ask that you please allow a minimum of 3-5 business days from the time the ALL results have been received for your provider to receive and review lab and test  results and contact you about these.  Most lab and test result comments from the provider will be sent through MyChart. Your provider may recommend changes to the plan of care, follow-up visits, repeat testing, ask questions, or request an office visit to discuss these results. You may reply directly to this message or call the office at [phone] to provide information for the provider or set up an appointment. In some instances, you will be called with test results and recommendations. Please let us  know if this is preferred and we will make note of this in your chart to provide this for you.    If you have not heard a response to your lab or test results in 5 business days from all results returning to MyChart, please call the office to let us  know. We ask that you please avoid calling prior to this time unless there is an emergent concern. Due to high call volumes, this can delay the resulting process.   After Hours: For all non-emergency after hours needs, please call the office at [phone] and select the option to reach the on-call provider service. On-call services are shared between multiple [place] offices and therefore it will not be possible to speak directly with your provider. On-call providers may provide medical advice and recommendations, but are unable to provide refills for maintenance medications.  For all emergency or urgent medical needs after normal business hours, we recommend that you seek care at the closest Urgent Care or Emergency Department to ensure  appropriate treatment in a timely manner.  [facility] [place] has a 24 hour emergency room located on the ground floor for your convenience.    Urgent Concerns During the Business Day Providers are seeing patients from 8AM to 5PM, Monday through Thursday, and 8AM to 12PM on Friday with a busy schedule and are most often not able to respond to non-urgent calls until the end of the day or the next  business day. If you should have URGENT concerns during the day, please call and speak to the nurse or schedule a same day appointment so that we can address your concern without delay.    Thank you, again, for choosing me as your health care partner. I appreciate your trust and look forward to learning more about you.    Evalene Arts, FNP-C

## 2023-10-25 NOTE — Progress Notes (Signed)
 Acute Care Office Visit  Subjective:   Thomas Valdez [birth year] 10/25/2023  Chief Complaint  Patient presents with   Rectal Pain   HPI:  Discussed the use of AI scribe software for clinical note transcription with the patient, who gave verbal consent to proceed.  History of Present Illness   Thomas Valdez is a 27 year old male with a history of colon resection due to diverticulitis who presents with rectal pain and recurrent HSV outbreaks.  He has been experiencing rectal pain that began as discomfort last Thursday. Initially, he thought it might be due to a cut from wiping with a washcloth, but the pain worsened and became more tender over time. By yesterday, he noticed significant inflammation in the area and documented it with a picture. He describes the current outbreak as having a pinkish color that he hasn't seen since his initial diagnosis.  He has a history of colon resection due to diverticulitis, which occurred about a year ago. Prior to the surgery, he experienced occasional flare-ups that were manageable with a few doses of medication. However, post-surgery, he feels his immune system has not fully recovered, and he has not returned to his baseline health. He takes Valtrex  1 gram daily, but notes that missing a dose leads to severe outbreaks. He also mentions that his sores used to heal completely, but now they remain present even after healing.  He has not been sexually active since May 2025 and has not engaged in any activities that might exacerbate his condition. He attributes some of his current health issues to increased stress from work and school, as well as inadequate sleep. He used to work out regularly before his surgery, which he believes helped prevent outbreaks. No urinary symptoms and his bowel movements have normalized since the surgery.      The following portions of the patient's history were reviewed and updated as appropriate: past  medical history, past surgical history, family history, social history, allergies, medications, and problem list.   Patient Active Problem List   Diagnosis Date Noted   Anemia 10/10/2023   Recurrent genital HSV (herpes simplex virus) infection 12/02/2022   History of colonic diverticulitis 12/02/2022   Status post partial resection of colon 12/02/2022   Poor appetite 09/07/2022   History of sexually transmitted disease 06/25/2022   Establishing care with new doctor, encounter for 01/21/2021   Wellness examination 01/21/2021   Past Medical History:  Diagnosis Date   Diverticulitis    Syphilis    per patient treated at age 41   Past Surgical History:  Procedure Laterality Date   COLONOSCOPY WITH PROPOFOL  N/A 08/23/2022   Procedure: COLONOSCOPY WITH PROPOFOL ;  Surgeon: Shaaron Lamar HERO, MD;  [facility];  Service: Endoscopy;  Laterality: N/A;  1:15 pm, asa 1   none     POLYPECTOMY  08/23/2022   Procedure: POLYPECTOMY;  Surgeon: Shaaron Lamar HERO, MD;  Location: [facility];  Service: Endoscopy;;   Family History  Problem Relation Age of Onset   Cancer Father        prostate   Heart disease Maternal Grandmother    Inflammatory bowel disease Neg Hx    Colon cancer Neg Hx    Outpatient Medications Prior to Visit  Medication Sig Dispense Refill   naproxen  (NAPROSYN ) 500 MG tablet Take 1 tablet (500 mg total) by mouth 2 (two) times daily. 30 tablet 0   valACYclovir  (VALTREX ) 1000 MG tablet Take 1  tablet (1,000 mg total) by mouth daily. 90 tablet 3   No facility-administered medications prior to visit.   No Known Allergies  ROS: A complete ROS was performed with pertinent positives/negatives noted in the HPI. The remainder of the ROS are negative.    Objective:   Today's Vitals   10/25/23 1408  BP: 109/74  Pulse: 82  Resp: 16  Weight: 184 lb (83.5 kg)  Height: 5' 10 (1.778 m)  PainSc: 0-No pain   GENERAL: Well-appearing, in NAD. Well nourished.  SKIN: Pink,  warm and dry. No rash, lesion, ulceration, or ecchymoses.  Head: Normocephalic. NECK: Trachea midline. Full ROM w/o pain or tenderness. No lymphadenopathy.  EARS: Tympanic membranes are intact, translucent without bulging and without drainage. Appropriate landmarks visualized.  EYES: Conjunctiva clear without exudates. EOMI, PERRL, no drainage present.  NOSE: Septum midline w/o deformity. Nares patent, mucosa pink and non-inflamed w/o drainage. No sinus tenderness.  THROAT: Uvula midline. Oropharynx clear. Tonsils non-inflamed without exudate. Mucous membranes pink and moist.  RESPIRATORY: Chest wall symmetrical. Respirations even and non-labored. Breath sounds clear to auscultation bilaterally.  CARDIAC: S1, S2 present, regular rate and rhythm without murmur or gallops. Peripheral pulses 2+ bilaterally.  GU: Deferred. (Photo shown by patient that he took yesterday).  MSK: Muscle tone and strength appropriate for age. Joints w/o tenderness, redness, or swelling.  EXTREMITIES: Without clubbing, cyanosis, or edema.  NEUROLOGIC: No motor or sensory deficits. Steady, even gait. C2-C12 intact.  PSYCH/MENTAL STATUS: Alert, oriented x 3. Cooperative, appropriate mood and affect.     Assessment & Plan:   1. Anal pain (Primary) Recurrent outbreaks with increased severity and frequency post-surgery. Current Valtrex  regimen inadequate. Stress and lack of sleep likely contributing. Increase Valtrex  to 1 gram twice daily during outbreaks for 7 to 10 days, then return to 1 gram once daily. Order STI testing. Encouraged stress management and adequate sleep. Discussed regular exercise to boost immune function.   2. Recurrent genital HSV (herpes simplex virus) infection See #1 - valACYclovir  (VALTREX ) 1000 MG tablet; Take 1 tablet (1,000 mg total) by mouth 2 (two) times daily.  Dispense: 90 tablet; Refill: 3  3. Wellness examination Will complete routine physical labs.  - TSH + free T4 - Lipid panel -  CMP14+EGFR  4. Positive RPR test See #5 - RPR w/reflex to TrepSure  5. History of syphilis Will complete screening STI testing.  - RPR w/reflex to TrepSure  6. Possible exposure to STI See #5 - RPR - HIV antibody (with reflex) - Hepatitis C antibody - Ct/GC/Tv NAA+Mycoplasmas Urine  7. Iron deficiency anemia, unspecified iron deficiency anemia type Will check CBC and iron studies due to history of IDA.  - Iron, TIBC and Ferritin Panel - CBC w/Diff/Platelet   Meds ordered this encounter  Medications   valACYclovir  (VALTREX ) 1000 MG tablet    Sig: Take 1 tablet (1,000 mg total) by mouth 2 (two) times daily.    Dispense:  90 tablet    Refill:  3    Supervising Provider:   ZIGLAR, SUSAN K [[id number]]    Return in about 4 weeks (around 11/22/2023).    Patient to reach out to office if new, worrisome, or unresolved symptoms arise or if no improvement in patient's condition. Patient verbalized understanding and is agreeable to treatment plan. All questions answered to patient's satisfaction.    Evalene Arts, FNP

## 2023-10-26 LAB — IRON,TIBC AND FERRITIN PANEL
Ferritin: 75 ng/mL (ref 30–400)
Iron Saturation: 28 % (ref 15–55)
Iron: 99 ug/dL (ref 38–169)
Total Iron Binding Capacity: 349 ug/dL (ref 250–450)
UIBC: 250 ug/dL (ref 111–343)

## 2023-10-26 LAB — CBC WITH DIFFERENTIAL/PLATELET
Basophils Absolute: 0 x10E3/uL (ref 0.0–0.2)
Basos: 1 %
EOS (ABSOLUTE): 0 x10E3/uL (ref 0.0–0.4)
Eos: 1 %
Hematocrit: 42.9 % (ref 37.5–51.0)
Hemoglobin: 14.7 g/dL (ref 13.0–17.7)
Immature Grans (Abs): 0 x10E3/uL (ref 0.0–0.1)
Immature Granulocytes: 0 %
Lymphocytes Absolute: 1.5 x10E3/uL (ref 0.7–3.1)
Lymphs: 35 %
MCH: 31.7 pg (ref 26.6–33.0)
MCHC: 34.3 g/dL (ref 31.5–35.7)
MCV: 93 fL (ref 79–97)
Monocytes Absolute: 0.4 x10E3/uL (ref 0.1–0.9)
Monocytes: 10 %
Neutrophils Absolute: 2.4 x10E3/uL (ref 1.4–7.0)
Neutrophils: 53 %
Platelets: 348 x10E3/uL (ref 150–450)
RBC: 4.64 x10E6/uL (ref 4.14–5.80)
RDW: 12.5 % (ref 11.6–15.4)
WBC: 4.4 x10E3/uL (ref 3.4–10.8)

## 2023-10-26 LAB — CMP14+EGFR
ALT: 19 IU/L (ref 0–44)
AST: 21 IU/L (ref 0–40)
Albumin: 4.5 g/dL (ref 4.3–5.2)
Alkaline Phosphatase: 75 IU/L (ref 47–123)
BUN/Creatinine Ratio: 18 (ref 9–20)
BUN: 18 mg/dL (ref 6–20)
Bilirubin Total: 0.3 mg/dL (ref 0.0–1.2)
CO2: 22 mmol/L (ref 20–29)
Calcium: 9.2 mg/dL (ref 8.7–10.2)
Chloride: 101 mmol/L (ref 96–106)
Creatinine, Ser: 0.98 mg/dL (ref 0.76–1.27)
Globulin, Total: 2.6 g/dL (ref 1.5–4.5)
Glucose: 83 mg/dL (ref 70–99)
Potassium: 4.1 mmol/L (ref 3.5–5.2)
Sodium: 137 mmol/L (ref 134–144)
Total Protein: 7.1 g/dL (ref 6.0–8.5)
eGFR: 107 mL/min/1.73 (ref >59)

## 2023-10-26 LAB — CT/GC/TV NAA+MYCOPLASMAS URINE
Chlamydia trachomatis, NAA: NEGATIVE
Mycoplasma genitalium NAA: NEGATIVE
Mycoplasma hominis NAA: NEGATIVE
Neisseria gonorrhoeae, NAA: NEGATIVE
Trich vag by NAA: NEGATIVE
Ureaplasma spp NAA: NEGATIVE

## 2023-10-26 LAB — LIPID PANEL
Chol/HDL Ratio: 3.7 ratio (ref 0.0–5.0)
Cholesterol, Total: 189 mg/dL (ref 100–199)
HDL: 51 mg/dL (ref >39)
LDL Chol Calc (NIH): 122 mg/dL — ABNORMAL HIGH (ref 0–99)
Triglycerides: 86 mg/dL (ref 0–149)
VLDL Cholesterol Cal: 16 mg/dL (ref 5–40)

## 2023-10-26 LAB — RPR, QUANT+TP ABS (REFLEX)
Rapid Plasma Reagin, Quant: 1:2 {titer} — ABNORMAL HIGH
T Pallidum Abs: REACTIVE — AB

## 2023-10-26 LAB — TSH+FREE T4
Free T4: 1.57 ng/dL (ref 0.82–1.77)
TSH: 0.859 u[IU]/mL (ref 0.450–4.500)

## 2023-10-26 LAB — HIV ANTIBODY (ROUTINE TESTING W REFLEX): HIV Screen 4th Generation wRfx: NONREACTIVE

## 2023-10-26 LAB — RPR: RPR Ser Ql: REACTIVE — AB

## 2023-10-26 LAB — HEPATITIS C ANTIBODY: Hep C Virus Ab: NONREACTIVE

## 2023-10-27 ENCOUNTER — Telehealth: Payer: Self-pay | Admitting: Family Medicine

## 2023-10-27 ENCOUNTER — Other Ambulatory Visit: Payer: Self-pay | Admitting: Family Medicine

## 2023-10-27 DIAGNOSIS — Z8619 Personal history of other infectious and parasitic diseases: Secondary | ICD-10-CM

## 2023-10-27 NOTE — Telephone Encounter (Signed)
 Reviewed his lab results with him.  Everything was normal except for mildly elevated LDL and his RPR was positive at 1:2.  Asked him to repeat this test in 2 weeks to make sure this is not a new exposure.  He agreed.

## 2023-11-07 ENCOUNTER — Ambulatory Visit: Admitting: Orthopedic Surgery

## 2023-11-07 DIAGNOSIS — M25562 Pain in left knee: Secondary | ICD-10-CM | POA: Diagnosis not present

## 2023-11-08 ENCOUNTER — Encounter: Payer: Self-pay | Admitting: Orthopedic Surgery

## 2023-11-08 NOTE — Progress Notes (Unsigned)
 Office Visit Note   Patient: Thomas Valdez           Date of Birth: 1995-04-05           MRN: [id number] Visit Date: 11/07/2023 Requested by: Ziglar, Susan K, MD [address] [place],  [place] [address] PCP: Towana Small, FNP  Subjective: Chief Complaint  Patient presents with   Other    Follow up to review MRI scan    HPI: Thomas Valdez is a 28 y.o. male who presents to the office reporting left knee pain.  Patient uses a knee brace all the time.  Since he was last seen has had an MRI scan of the knee which does show a little bit of lateral patellar tilt with small joint effusion and stable intact collateral and cruciate ligaments as well as intact menisci.  Patient states that he does feel weak and stiff when he is up and down stairs.  Denies any history of injury but he states that he has a history of subluxation of the patella over the past several years.  1 episode occurred 2 years ago and he had to put it back into place.  Has had an injection in the past which helped him for 2 to 3 days.  Brace helps him the most.  He used to exercise and he likes to jog.  He does live in [place]..                ROS: All systems reviewed are negative as they relate to the chief complaint within the history of present illness.  Patient denies fevers or chills.  Assessment & Plan: Visit Diagnoses:  1. Left knee pain, unspecified chronicity     Plan: Impression is left knee pain with nothing definitively operative in the knee.  I think his history of instability 2 years ago does go along with some lateral patellar tracking and possible feelings of weakness and pain in the anterior aspect of the knee.  I do not think it has really reached surgical threshold of symptoms yet.  Will plan for physical therapy for quad strengthening on that left-hand side plus a home exercise program.  8-week return with decision for or against injection and/or further procedural intervention at that  time.  Follow-Up Instructions: Return in about 8 weeks (around 01/02/2024).   Orders:  Orders Placed This Encounter  Procedures   Ambulatory referral to Physical Therapy   No orders of the defined types were placed in this encounter.     Procedures: No procedures performed   Clinical Data: No additional findings.  Objective: Vital Signs: There were no vitals taken for this visit.  Physical Exam:  Constitutional: Patient appears well-developed HEENT:  Head: Normocephalic Eyes:EOM are normal Neck: Normal range of motion Cardiovascular: Normal rate Pulmonary/chest: Effort normal Neurologic: Patient is alert Skin: Skin is warm Psychiatric: Patient has normal mood and affect  Ortho Exam: Ortho exam demonstrates full range of motion of the left knee with no effusion.  Not too much patellar apprehension.  Collateral crucial ligaments are stable.  Mild patellofemoral crepitus is present bilaterally.  Does have a little bit of tenderness around the inferior pole of the patella.  Extensor mechanism intact nontender and functional.  Specialty Comments:  No specialty comments available.  Imaging: No results found.   PMFS History: Patient Active Problem List   Diagnosis Date Noted   Anemia 10/10/2023   Recurrent genital HSV (herpes simplex virus) infection 12/02/2022  History of colonic diverticulitis 12/02/2022   Status post partial resection of colon 12/02/2022   History of sexually transmitted disease 06/25/2022   Wellness examination 01/21/2021   Past Medical History:  Diagnosis Date   Diverticulitis    Syphilis    per patient treated at age 16    Family History  Problem Relation Age of Onset   Cancer Father        prostate   Heart disease Maternal Grandmother    Inflammatory bowel disease Neg Hx    Colon cancer Neg Hx     Past Surgical History:  Procedure Laterality Date   COLONOSCOPY WITH PROPOFOL  N/A 08/23/2022   Procedure: COLONOSCOPY WITH PROPOFOL ;   Surgeon: Shaaron Lamar HERO, MD;  [facility];  Service: Endoscopy;  Laterality: N/A;  1:15 pm, asa 1   none     POLYPECTOMY  08/23/2022   Procedure: POLYPECTOMY;  Surgeon: Shaaron Lamar HERO, MD;  [facility];  Service: Endoscopy;;   Social History   Occupational History   Not on file  Tobacco Use   Smoking status: Some Days    Types: Cigarettes    Passive exposure: Past   Smokeless tobacco: Never  Vaping Use   Vaping status: Never Used  Substance and Sexual Activity   Alcohol use: Not Currently    Comment: social   Drug use: Not Currently    Types: Marijuana   Sexual activity: Yes    Partners: Female, Male

## 2023-11-14 ENCOUNTER — Encounter: Payer: Self-pay | Admitting: Radiology

## 2023-11-22 ENCOUNTER — Ambulatory Visit: Admitting: Family Medicine

## 2023-11-25 ENCOUNTER — Ambulatory Visit: Admitting: Family Medicine

## 2023-12-05 ENCOUNTER — Telehealth: Payer: Self-pay | Admitting: Orthopedic Surgery

## 2023-12-05 NOTE — Telephone Encounter (Signed)
 Patient called and said that the [facility] didn't reach out to him yet for the PT. CB#[phone]

## 2023-12-06 NOTE — Telephone Encounter (Signed)
 Order was placed wrong, contacted pt and informed him and gave him a number as well to call to check

## 2023-12-15 ENCOUNTER — Telehealth: Admitting: Family Medicine

## 2023-12-15 ENCOUNTER — Encounter

## 2023-12-15 DIAGNOSIS — R112 Nausea with vomiting, unspecified: Secondary | ICD-10-CM | POA: Diagnosis not present

## 2023-12-15 MED ORDER — ONDANSETRON HCL 4 MG PO TABS: 4.0000 mg | ORAL_TABLET | Freq: Three times a day (TID) | ORAL | 0 refills | Status: AC | PRN

## 2023-12-15 NOTE — Progress Notes (Signed)

## 2023-12-26 ENCOUNTER — Telehealth: Payer: Self-pay | Admitting: Orthopedic Surgery

## 2023-12-26 NOTE — Telephone Encounter (Signed)
 Pt called and ask if he can changed his PT to [facility]. He stated that the place in [place] was very rude and he isn't doing it there. CB#[phone]

## 2023-12-29 ENCOUNTER — Ambulatory Visit: Admitting: Physical Therapy

## 2024-01-16 ENCOUNTER — Ambulatory Visit

## 2024-01-23 ENCOUNTER — Ambulatory Visit

## 2024-01-25 ENCOUNTER — Ambulatory Visit

## 2024-01-30 ENCOUNTER — Ambulatory Visit

## 2024-02-01 ENCOUNTER — Ambulatory Visit

## 2024-02-03 ENCOUNTER — Encounter: Payer: Self-pay | Admitting: Family Medicine

## 2024-02-03 ENCOUNTER — Ambulatory Visit (INDEPENDENT_AMBULATORY_CARE_PROVIDER_SITE_OTHER): Admitting: Family Medicine

## 2024-02-03 VITALS — BP 123/82 | HR 75 | Resp 16 | Ht 70.0 in | Wt 195.0 lb

## 2024-02-03 DIAGNOSIS — Z0000 Encounter for general adult medical examination without abnormal findings: Secondary | ICD-10-CM

## 2024-02-03 DIAGNOSIS — E7800 Pure hypercholesterolemia, unspecified: Secondary | ICD-10-CM | POA: Diagnosis not present

## 2024-02-03 DIAGNOSIS — Z113 Encounter for screening for infections with a predominantly sexual mode of transmission: Secondary | ICD-10-CM | POA: Diagnosis not present

## 2024-02-03 NOTE — Patient Instructions (Signed)
 Health Maintenance Recommendations Screening Testing Colon Cancer Screening Every 1-10 years based on test performed, risk factors, and history Starting at age 30 Bone Density Screening Every 2-10 years based on history Starting at age 89 for women Recommendations for men differ based on medication usage, history, and risk factors AAA Screening One time ultrasound Men 64-41 years old who have every smoked Lung Cancer Screening Low Dose Lung CT every 12 months Age 37-80 years with a 30 pack-year smoking history who still smoke or who have quit within the last 15 years   Screening Labs Routine  Labs: Complete Blood Count (CBC), Complete Metabolic Panel (CMP), Cholesterol (Lipid Panel) Every 6-12 months based on history and medications May be recommended more frequently based on current conditions or previous results Hemoglobin A1c Lab Every 3-12 months based on history and previous results Starting at age 63 or earlier with diagnosis of diabetes, high cholesterol, BMI >26, and/or risk factors Frequent monitoring for patients with diabetes to ensure blood sugar control Thyroid Panel (TSH w/ T3 & T4) Every 6 months based on history, symptoms, and risk factors May be repeated more often if on medication HIV One time testing for all patients 48 and older May be repeated more frequently for patients with increased risk factors or exposure Hepatitis C One time testing for all patients 57 and older May be repeated more frequently for patients with increased risk factors or exposure Gonorrhea, Chlamydia Every 12 months for all sexually active persons 13-24 years Additional monitoring may be recommended for those who are considered high risk or who have symptoms PSA Men 48-51 years old with risk factors Additional screening may be recommended from age 26-69 based on risk factors, symptoms, and history   Vaccine Recommendations Tetanus Booster All adults every 10 years Flu Vaccine All  patients 6 months and older every year COVID Vaccine All patients 12 years and older Initial dosing with booster May recommend additional booster based on age and health history HPV Vaccine 2 doses all patients age 32-26 Dosing may be considered for patients over 26 Shingles Vaccine (Shingrix) 2 doses all adults 55 years and older Pneumonia (Pneumovax 23) All adults 65 years and older May recommend earlier dosing based on health history Pneumonia (Prevnar 9) All adults 65 years and older Dosed 1 year after Pneumovax 23   Additional Screening, Testing, and Vaccinations may be recommended on an individualized basis based on family history, health history, risk factors, and/or exposure.  __________________________________________________________   Diet Recommendations for All Patients   I recommend that all patients maintain a diet low in saturated fats, carbohydrates, and cholesterol. While this can be challenging at first, it is not impossible and small changes can make big differences.  Things to try: Decreasing the amount of soda, sweet tea, and/or juice to one or less per day and replace with water While water is always the first choice, if you do not like water you may consider adding a water additive without sugar to improve the taste other sugar free drinks Replace potatoes with a brightly colored vegetable at dinner Use healthy oils, such as canola oil or olive oil, instead of butter or hard margarine Limit your bread intake to two pieces or less a day Replace regular pasta with low carb pasta options Bake, broil, or grill foods instead of frying Monitor portion sizes  Eat smaller, more frequent meals throughout the day instead of large meals   An important thing to remember is, if you love foods that  are not great for your health, you don't have to give them up completely. Instead, allow these foods to be a reward when you have done well. Allowing yourself to still have  special treats every once in a while is a nice way to tell yourself thank you for working hard to keep yourself healthy.    Also remember that every day is a new day. If you have a bad day and fall off the wagon, you can still climb right back up and keep moving along on your journey!   We have resources available to help you!  Some websites that may be helpful include: www.http://www.wall-moore.info/        Www.VeryWellFit.com _____________________________________________________________   Activity Recommendations for All Patients   I recommend that all adults get at least 20 minutes of moderate physical activity that elevates your heart rate at least 5 days out of the week.  Some examples include: Walking or jogging at a pace that allows you to carry on a conversation Cycling (stationary bike or outdoors) Water aerobics Yoga Weight lifting Dancing If physical limitations prevent you from putting stress on your joints, exercise in a pool or seated in a chair are excellent options.   Do determine your MAXIMUM heart rate for activity: YOUR AGE - 220 = MAX HeartRate    Remember! Do not push yourself too hard.  Start slowly and build up your pace, speed, weight, time in exercise, etc.  Allow your body to rest between exercise and get good sleep. You will need more water than normal when you are exerting yourself. Do not wait until you are thirsty to drink. Drink with a purpose of getting in at least 8, 8 ounce glasses of water a day plus more depending on how much you exercise and sweat.      If you begin to develop dizziness, chest pain, abdominal pain, jaw pain, shortness of breath, headache, vision changes, lightheadedness, or other concerning symptoms, stop the activity and allow your body to rest. If your symptoms are severe, seek emergency evaluation immediately. If your symptoms are concerning, but not severe, please let us  know so that we can recommend further evaluation.

## 2024-02-03 NOTE — Progress Notes (Addendum)
 "  Subjective:   Thomas Valdez [birth year] 02/03/2024  CC: Chief Complaint  Patient presents with   Annual Exam   HPI: Thomas Valdez is a 29 y.o. male who presents for a routine health maintenance exam.  Diet-  working out 3x per week, both cardio & lifting  Exercise- meal prep   HEALTH SCREENINGS: - Vision Screening: plans to schedule, no vision changes  - Dental Visits: up to date - Testicular Exam: Declined - STD Screening: Ordered today - PSA (50+): Not applicable  - Colonoscopy (45+): Not applicable  Discussed with patient purpose of the colonoscopy is to detect colon cancer at curable precancerous or early stages  - AAA Screening: Not applicable Men age 23-75 who have ever smoked - Lung Cancer screening with low-dose CT: Not applicable-  Adults age 75-80 who are current cigarette smokers or quit within the last 15 years. Must have 20 pack year history.   Depression and Anxiety Screen done today and results listed below:     02/03/2024    9:39 AM 10/25/2023    2:08 PM 10/10/2023    2:35 PM 12/02/2022   10:51 AM 08/19/2021    4:16 PM  Depression screen PHQ 2/9  Decreased Interest 0 0 0 0 0  Down, Depressed, Hopeless 0 0 0 0 0  PHQ - 2 Score 0 0 0 0 0  Altered sleeping 0 0 0 1   Tired, decreased energy 0 0 0 0   Change in appetite 0 0 0 0   Feeling bad or failure about yourself  0 0 0 0   Trouble concentrating 0 0 0 0   Moving slowly or fidgety/restless 0 0 0 0   Suicidal thoughts 0 0 0 0   PHQ-9 Score 0 0  0  1    Difficult doing work/chores Not difficult at all  Not difficult at all Not difficult at all      Data saved with a previous flowsheet row definition      02/03/2024    9:39 AM 10/25/2023    2:08 PM 12/02/2022   10:51 AM  GAD 7 : Generalized Anxiety Score  Nervous, Anxious, on Edge 0 0  0   Control/stop worrying 0 0  0   Worry too much - different things 0 0  0   Trouble relaxing 0 0  0   Restless 0 0  0   Easily annoyed or  irritable 0 0  0   Afraid - awful might happen 0 0  0   Total GAD 7 Score 0 0 0  Anxiety Difficulty Not difficult at all  Not difficult at all     Data saved with a previous flowsheet row definition   IMMUNIZATIONS:  - Tdap: Tetanus vaccination status reviewed: declines. - Influenza: declines - Pneumovax: declines - Prevnar: declines - Shingrix vaccine (50+): Not applicable  Past medical history, surgical history, medications, allergies, family history and social history reviewed with patient today and changes made to appropriate areas of the chart.   Past Medical History:  Diagnosis Date   Diverticulitis    Syphilis    per patient treated at age 59    Past Surgical History:  Procedure Laterality Date   COLONOSCOPY WITH PROPOFOL  N/A 08/23/2022   Procedure: COLONOSCOPY WITH PROPOFOL ;  Surgeon: Shaaron Lamar HERO, MD;  Location: [facility];  Service: Endoscopy;  Laterality: N/A;  1:15 pm, asa 1   none     POLYPECTOMY  08/23/2022   Procedure: POLYPECTOMY;  Surgeon: Shaaron Lamar HERO, MD;  Location: [facility];  Service: Endoscopy;;    Current Outpatient Medications on File Prior to Visit  Medication Sig   ondansetron  (ZOFRAN ) 4 MG tablet Take 1 tablet (4 mg total) by mouth every 8 (eight) hours as needed for nausea or vomiting.   valACYclovir  (VALTREX ) 1000 MG tablet Take 1 tablet (1,000 mg total) by mouth 2 (two) times daily.   No current facility-administered medications on file prior to visit.    Allergies[1]   Social History   Socioeconomic History   Marital status: Single    Spouse name: Not on file   Number of children: Not on file   Years of education: Not on file   Highest education level: Associate degree: occupational, scientist, product/process development, or vocational program  Occupational History   Not on file  Tobacco Use   Smoking status: Former    Passive exposure: Past   Smokeless tobacco: Never  Vaping Use   Vaping status: Never Used  Substance and Sexual Activity    Alcohol use: Not Currently    Comment: social   Drug use: Not Currently    Types: Marijuana   Sexual activity: Yes    Partners: Female, Male  Other Topics Concern   Not on file  Social History Narrative   Not on file   Social Drivers of Health   Tobacco Use: Medium Risk (02/03/2024)   Patient History    Smoking Tobacco Use: Former    Smokeless Tobacco Use: Never    Passive Exposure: Past  Physicist, Medical Strain: Low Risk (10/25/2023)   Overall Financial Resource Strain (CARDIA)    Difficulty of Paying Living Expenses: Not hard at all  Food Insecurity: No Food Insecurity (10/25/2023)   Epic    Worried About Programme Researcher, Broadcasting/film/video in the Last Year: Never true    Ran Out of Food in the Last Year: Never true  Transportation Needs: Unknown (10/25/2023)   Epic    Lack of Transportation (Medical): No    Lack of Transportation (Non-Medical): Not on file  Physical Activity: Insufficiently Active (10/25/2023)   Exercise Vital Sign    Days of Exercise per Week: 1 day    Minutes of Exercise per Session: 60 min  Stress: No Stress Concern Present (10/25/2023)   [employer] of Occupational Health - Occupational Stress Questionnaire    Feeling of Stress: Only a little  Social Connections: Socially Isolated (10/25/2023)   Social Connection and Isolation Panel    Frequency of Communication with Friends and Family: More than three times a week    Frequency of Social Gatherings with Friends and Family: Once a week    Attends Religious Services: Never    Database Administrator or Organizations: No    Attends Engineer, Structural: Not on file    Marital Status: Never married  Intimate Partner Violence: Not At Risk (11/22/2022)   Humiliation, Afraid, Rape, and Kick questionnaire    Fear of Current or Ex-Partner: No    Emotionally Abused: No    Physically Abused: No    Sexually Abused: No  Depression (PHQ2-9): Low Risk (02/03/2024)   Depression (PHQ2-9)    PHQ-2 Score: 0   Alcohol Screen: Low Risk (10/25/2023)   Alcohol Screen    Last Alcohol Screening Score (AUDIT): 2  Housing: Unknown (10/25/2023)   Epic    Unable to Pay for Housing in the Last Year: No    Number  of Times Moved in the Last Year: Not on file    Homeless in the Last Year: No  Utilities: Not At Risk (11/22/2022)   [facility] Utilities    Threatened with loss of utilities: No  Health Literacy: Not on file   Tobacco Use History[2] Social History   Substance and Sexual Activity  Alcohol Use Not Currently   Comment: social   Family History  Problem Relation Age of Onset   Cancer Father        prostate   Heart disease Maternal Grandmother    Inflammatory bowel disease Neg Hx    Colon cancer Neg Hx    ROS: Denies fever, fatigue, unexplained weight loss/gain, CP, SHOB, and palpatitations. Denies neurological deficits, gastrointestinal and/or genitourinary complaints, and skin changes.   Objective:   Today's Vitals   02/03/24 0937 02/03/24 1028  BP: (!) 132/90 123/82  Pulse: 75   Resp: 16   SpO2: 98%   Weight: 195 lb (88.5 kg)   Height: 5' 10 (1.778 m)   PainSc: 0-No pain    GENERAL APPEARANCE: Well-appearing, in NAD. Well nourished.  SKIN: Pink, warm and dry. Turgor normal. No rash, lesion, ulceration, or ecchymoses. Hair evenly distributed.  HEENT: HEAD: Normocephalic.  EYES: PERRLA. EOMI. Lids intact w/o defect. Sclera white, Conjunctiva pink w/o exudate.  EARS: External ear w/o redness, swelling, masses or lesions. EAC clear. TM's intact, translucent w/o bulging, appropriate landmarks visualized. Appropriate acuity to conversational tones.  NOSE: Septum midline w/o deformity. Nares patent, mucosa pink and non-inflamed w/o drainage. No sinus tenderness.  THROAT: Uvula midline. Oropharynx clear. Tonsils non-inflamed w/o exudate. Oral mucosa pink and moist.  NECK: Supple, Trachea midline. Full ROM w/o pain or tenderness. No lymphadenopathy. Thyroid non-tender w/o enlargement or  palpable masses.  RESPIRATORY: Chest wall symmetrical w/o masses. Respirations even and non-labored. Breath sounds clear to auscultation bilaterally. No wheezes, rales, rhonchi, or crackles. CARDIAC: S1, S2 present, regular rate and rhythm. No gallops, murmurs, rubs, or clicks. PMI w/o lifts, heaves, or thrills. No carotid bruits. Capillary refill <2 seconds. Peripheral pulses 2+ bilaterally. GI: Abdomen soft w/o distention. Normoactive bowel sounds. No palpable masses or tenderness. No guarding or rebound tenderness. Liver and spleen w/o tenderness or enlargement. No CVA tenderness.  GU: Deferred exam. MSK: Muscle tone and strength appropriate for age, w/o atrophy or abnormal movement. EXTREMITIES: Active ROM intact, w/o tenderness, crepitus, or contracture. No obvious joint deformities or effusions. No clubbing, edema, or cyanosis.  NEUROLOGIC: CN's II-XII intact. Motor strength symmetrical with no obvious weakness. No sensory deficits. DTR 2+ symmetric bilaterally. Steady, even gait.  PSYCH/MENTAL STATUS: Alert, oriented x 3. Cooperative, appropriate mood and affect.   Results for orders placed or performed in visit on 10/25/23  TSH + free T4   Collection Time: 10/25/23  2:27 PM  Result Value Ref Range   TSH 0.859 0.450 - 4.500 uIU/mL   Free T4 1.57 0.82 - 1.77 ng/dL  RPR   Collection Time: 10/25/23  2:27 PM  Result Value Ref Range   RPR Ser Ql Reactive (A) Non Reactive  Lipid panel   Collection Time: 10/25/23  2:27 PM  Result Value Ref Range   Cholesterol, Total 189 100 - 199 mg/dL   Triglycerides 86 0 - 149 mg/dL   HDL 51 >60 mg/dL   VLDL Cholesterol Cal 16 5 - 40 mg/dL   LDL Chol Calc (NIH) 877 (H) 0 - 99 mg/dL   Chol/HDL Ratio 3.7 0.0 - 5.0 ratio  Iron,  TIBC and Ferritin Panel   Collection Time: 10/25/23  2:27 PM  Result Value Ref Range   Total Iron Binding Capacity 349 250 - 450 ug/dL   UIBC 749 888 - 656 ug/dL   Iron 99 38 - 830 ug/dL   Iron Saturation 28 15 - 55 %    Ferritin 75 30 - 400 ng/mL  HIV antibody (with reflex)   Collection Time: 10/25/23  2:27 PM  Result Value Ref Range   HIV Screen 4th Generation wRfx Non Reactive Non Reactive  Hepatitis C antibody   Collection Time: 10/25/23  2:27 PM  Result Value Ref Range   Hep C Virus Ab Non Reactive Non Reactive  CMP14+EGFR   Collection Time: 10/25/23  2:27 PM  Result Value Ref Range   Glucose 83 70 - 99 mg/dL   BUN 18 6 - 20 mg/dL   Creatinine, Ser 9.01 0.76 - 1.27 mg/dL   eGFR 892 >40 fO/fpw/8.26   BUN/Creatinine Ratio 18 9 - 20   Sodium 137 134 - 144 mmol/L   Potassium 4.1 3.5 - 5.2 mmol/L   Chloride 101 96 - 106 mmol/L   CO2 22 20 - 29 mmol/L   Calcium 9.2 8.7 - 10.2 mg/dL   Total Protein 7.1 6.0 - 8.5 g/dL   Albumin 4.5 4.3 - 5.2 g/dL   Globulin, Total 2.6 1.5 - 4.5 g/dL   Bilirubin Total 0.3 0.0 - 1.2 mg/dL   Alkaline Phosphatase 75 47 - 123 IU/L   AST 21 0 - 40 IU/L   ALT 19 0 - 44 IU/L  CBC w/Diff/Platelet   Collection Time: 10/25/23  2:27 PM  Result Value Ref Range   WBC 4.4 3.4 - 10.8 x10E3/uL   RBC 4.64 4.14 - 5.80 x10E6/uL   Hemoglobin 14.7 13.0 - 17.7 g/dL   Hematocrit 57.0 62.4 - 51.0 %   MCV 93 79 - 97 fL   MCH 31.7 26.6 - 33.0 pg   MCHC 34.3 31.5 - 35.7 g/dL   RDW 87.4 88.3 - 84.5 %   Platelets 348 150 - 450 x10E3/uL   Neutrophils 53 Not Estab. %   Lymphs 35 Not Estab. %   Monocytes 10 Not Estab. %   Eos 1 Not Estab. %   Basos 1 Not Estab. %   Neutrophils Absolute 2.4 1.4 - 7.0 x10E3/uL   Lymphocytes Absolute 1.5 0.7 - 3.1 x10E3/uL   Monocytes Absolute 0.4 0.1 - 0.9 x10E3/uL   EOS (ABSOLUTE) 0.0 0.0 - 0.4 x10E3/uL   Basophils Absolute 0.0 0.0 - 0.2 x10E3/uL   Immature Granulocytes 0 Not Estab. %   Immature Grans (Abs) 0.0 0.0 - 0.1 x10E3/uL  RPR, quant & T.pallidum antibodies   Collection Time: 10/25/23  2:27 PM  Result Value Ref Range   Rapid Plasma Reagin, Quant 1:2 (H) NonRea<1:1 titer   T Pallidum Abs Reactive (A) Non Reactive   Interpretation: Comment    Ct/GC/Tv NAA+Mycoplasmas Urine   Collection Time: 10/25/23  2:28 PM  Result Value Ref Range   Mycoplasma genitalium NAA Negative Negative   Mycoplasma hominis NAA Negative Negative   Ureaplasma spp NAA Negative Negative   Trich vag by NAA Negative Negative   Chlamydia trachomatis, NAA Negative Negative   Neisseria gonorrhoeae, NAA Negative Negative    Assessment & Plan:   1. Wellness examination (Primary) - Encouraged to adjust caloric intake to maintain or achieve ideal body weight, to reduce intake of dietary saturated fat and total fat, to limit sodium  intake by avoiding high sodium foods and not adding table salt, and to maintain adequate dietary potassium and calcium preferably from fresh fruits, vegetables, and low-fat dairy products.   - Advised to avoid cigarette smoking. - Discussed with the patient that most people either abstain from alcohol or drink within safe limits (<=14/week and <=4 drinks/occasion for males, <=7/weeks and <= 3 drinks/occasion for females) and that the risk for alcohol disorders and other health effects rises proportionally with the number of drinks per week and how often a drinker exceeds daily limits. - Discussed cessation/primary prevention of drug use and availability of treatment for abuse.  - Stressed the importance of regular exercise - Injury prevention: Discussed safety belts, safety helmets, smoke detector, smoking near bedding or upholstery.  - Dental health: Discussed importance of regular tooth brushing, flossing, and dental visits.  - Sexuality: Discussed sexually transmitted diseases, partner selection, use of condoms, avoidance of unintended pregnancy  and contraceptive alternatives.  NEXT PREVENTATIVE PHYSICAL DUE IN 1 YEAR.  2. Screen for sexually transmitted diseases Asymptomatic, would like to complete screening today.   - RPR W/RFLX TO RPR TITER, TREPONEMAL AB, SCREEN AND DIAGNOSIS - HIV antibody (with reflex) - Hep B Core Ab  W/Reflex - HCV Ab w Reflex to Quant PCR - Ct, Ng, Mycoplasmas NAA, Urine - Hepatitis B surface antigen  3. Elevated LDL cholesterol level Review of lipid panel 3 months ago: total chol 189, triglycerides 86, HDL 51, and LDL 122. Will repeat fasting lipid panel today.  - Lipid panel   Return in about 1 year (around 02/02/2025) for Physical with fasting labs.  Patient to reach out to office if new, worrisome, or unresolved symptoms arise or if no improvement in patient's condition. Patient verbalized understanding and is agreeable to treatment plan. All questions answered to patient's satisfaction.    Evalene Arts, FNP     [1] No Known Allergies [2]  Social History Tobacco Use  Smoking Status Former   Passive exposure: Past  Smokeless Tobacco Never   "

## 2024-02-06 ENCOUNTER — Ambulatory Visit

## 2024-02-06 LAB — LIPID PANEL
Chol/HDL Ratio: 4.1 ratio (ref 0.0–5.0)
Cholesterol, Total: 216 mg/dL — ABNORMAL HIGH (ref 100–199)
HDL: 53 mg/dL
LDL Chol Calc (NIH): 151 mg/dL — ABNORMAL HIGH (ref 0–99)
Triglycerides: 70 mg/dL (ref 0–149)
VLDL Cholesterol Cal: 12 mg/dL (ref 5–40)

## 2024-02-06 LAB — CT, NG, MYCOPLASMAS NAA, URINE
Chlamydia trachomatis, NAA: NEGATIVE
Mycoplasma genitalium NAA: NEGATIVE
Mycoplasma hominis NAA: NEGATIVE
Neisseria gonorrhoeae, NAA: NEGATIVE
Ureaplasma spp NAA: NEGATIVE

## 2024-02-06 LAB — HIV ANTIBODY (ROUTINE TESTING W REFLEX): HIV Screen 4th Generation wRfx: NONREACTIVE

## 2024-02-06 LAB — SYPHILIS: RPR W/REFLEX TO RPR TITER AND TREPONEMAL ANTIBODIES, TRADITIONAL SCREENING AND DIAGNOSIS ALGORITHM: RPR Ser Ql: REACTIVE — AB

## 2024-02-06 LAB — HCV AB W REFLEX TO QUANT PCR: HCV Ab: NONREACTIVE

## 2024-02-06 LAB — HEPATITIS B CORE AB W/REFLEX: Hep B Core Total Ab: NEGATIVE

## 2024-02-06 LAB — HEPATITIS B SURFACE ANTIGEN: Hepatitis B Surface Ag: NEGATIVE

## 2024-02-06 LAB — RPR, QUANT+TP ABS (REFLEX)
Rapid Plasma Reagin, Quant: 1:2 {titer} — ABNORMAL HIGH
T Pallidum Abs: REACTIVE — AB

## 2024-02-06 LAB — HCV INTERPRETATION

## 2024-02-08 ENCOUNTER — Ambulatory Visit

## 2024-02-08 ENCOUNTER — Ambulatory Visit: Payer: Self-pay | Admitting: Family Medicine

## 2024-02-09 ENCOUNTER — Ambulatory Visit

## 2024-02-14 ENCOUNTER — Ambulatory Visit

## 2024-02-15 ENCOUNTER — Other Ambulatory Visit: Payer: Self-pay | Admitting: Family Medicine

## 2024-02-15 ENCOUNTER — Encounter: Payer: Self-pay | Admitting: Family Medicine

## 2024-02-15 ENCOUNTER — Ambulatory Visit: Admitting: Family Medicine

## 2024-02-15 VITALS — BP 121/81 | HR 62 | Temp 97.8°F | Resp 16 | Ht 70.0 in | Wt 198.4 lb

## 2024-02-15 DIAGNOSIS — Z23 Encounter for immunization: Secondary | ICD-10-CM | POA: Diagnosis not present

## 2024-02-15 DIAGNOSIS — E663 Overweight: Secondary | ICD-10-CM | POA: Diagnosis not present

## 2024-02-15 DIAGNOSIS — E7800 Pure hypercholesterolemia, unspecified: Secondary | ICD-10-CM | POA: Diagnosis not present

## 2024-02-15 DIAGNOSIS — Z6828 Body mass index (BMI) 28.0-28.9, adult: Secondary | ICD-10-CM | POA: Diagnosis not present

## 2024-02-15 MED ORDER — WEGOVY 0.25 MG/0.5ML ~~LOC~~ SOAJ: 0.2500 mg | SUBCUTANEOUS | 0 refills | Status: AC

## 2024-02-15 NOTE — Progress Notes (Unsigned)
" ° °  Established Patient Office Visit  Subjective  Patient ID: Thomas Valdez, male    DOB: 1995-05-02  Age: 29 y.o. MRN: [id number]  Chief Complaint  Patient presents with   Weight Loss    Discuss weight loss.   Immunizations    Pt states labs recommend getting a Hep B vaccine.    Discussed the use of AI scribe software for clinical note transcription with the patient, who gave verbal consent to proceed.  History of Present Illness   Thomas Valdez is a 29 year old male with high cholesterol who presents for weight loss management.  He is actively engaged in a weight loss regimen, exercising three to four times a week with at least thirty minutes of cardio and weight training. Despite these efforts, he is concerned that his new role as a merchandiser, retail, which involves more desk work, might be contributing to his weight challenges.  He has a history of high cholesterol and has made dietary changes, eliminating processed foods such as packaged noodles, which he had been consuming frequently due to convenience. He acknowledges that these dietary habits may have contributed to his cholesterol issues, and he was concerned after seeing his blood work.  He previously tried phentermine approximately seven to eight years ago but discontinued it after two weeks due to adverse effects, including feeling jittery, paranoid, and experiencing insomnia. He is not interested in retrying this medication due to these side effects.  He is exploring other weight loss medication options and is considering injectables, although he has not yet contacted his insurance about coverage for these medications.      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 Overweight with body mass index (BMI) of 28 to 28.9 in adult (Primary) *** - semaglutide -weight management (WEGOVY ) 0.25 MG/0.5ML SOAJ SQ injection; Inject 0.25 mg into the skin once a week.  Dispense: 2 mL; Refill: 0  2. Elevated LDL cholesterol level *** - semaglutide -weight management (WEGOVY ) 0.25 MG/0.5ML SOAJ SQ injection; Inject 0.25 mg into the skin once a week.  Dispense: 2 mL; Refill: 0  3. Immunization due *** - Heplisav-B (HepB-CPG) Vaccine    Return in about 4 weeks (around 03/14/2024) for weight loss f/u .    Evalene Arts, FNP "

## 2024-02-15 NOTE — Patient Instructions (Addendum)
 I would start with contacting your HR benefit through your employer and asking if they opt in for weight loss coverage or not.   Questions to ask your insurance: Coverage basics * Is Wegovy  covered under my plan? (Some plans cover weight-loss meds, many dont.) * Is it covered under pharmacy benefits or medical benefits?  Eligibility & requirements * Do I need prior authorization? If yes: * What criteria do I have to meet? (BMI threshold, comorbidities like diabetes, hypertension, etc.) * Does documentation from my doctor need to include anything specific? * Is there a required trial of other weight-loss medications first? (A step therapy requirement.)  Cost questions (very important) * What would my monthly out-of-pocket cost be? * Does the cost change after I meet my deductible?  * Is there a deductible that applies? * Is there a copay or coinsurance?  Pharmacy & supply details * Do I have to use a specific pharmacy or mail-order service? * Is there a quantity limit (e.g., one month at a time)? * Are all dosage strengths covered (starter doses and maintenance doses)?  Duration & renewals * How long is coverage approved for? * Is re-authorization required after a certain period? * What do I need to show to continue coverage? (weight loss %, doctor follow-ups, etc.)

## 2024-02-16 ENCOUNTER — Ambulatory Visit

## 2024-03-14 ENCOUNTER — Ambulatory Visit: Admitting: Family Medicine
